# Patient Record
Sex: Female | Born: 1935 | Race: White | Hispanic: No | Smoking: Never smoker
Health system: Southern US, Community
[De-identification: ages and names within clinical notes are randomized; demographics above are authoritative.]

## PROBLEM LIST (undated history)

## (undated) DIAGNOSIS — I219 Acute myocardial infarction, unspecified: Secondary | ICD-10-CM

## (undated) DIAGNOSIS — I10 Essential (primary) hypertension: Secondary | ICD-10-CM

## (undated) DIAGNOSIS — I350 Nonrheumatic aortic (valve) stenosis: Secondary | ICD-10-CM

## (undated) DIAGNOSIS — E079 Disorder of thyroid, unspecified: Secondary | ICD-10-CM

## (undated) DIAGNOSIS — Z952 Presence of prosthetic heart valve: Secondary | ICD-10-CM

## (undated) HISTORY — PX: AORTIC VALVE REPLACEMENT: SHX41

---

## 2016-06-16 ENCOUNTER — Other Ambulatory Visit (HOSPITAL_COMMUNITY): Payer: Self-pay | Admitting: General Surgery

## 2016-06-23 ENCOUNTER — Other Ambulatory Visit: Payer: Self-pay | Admitting: General Surgery

## 2016-06-23 DIAGNOSIS — R109 Unspecified abdominal pain: Secondary | ICD-10-CM

## 2016-07-08 ENCOUNTER — Ambulatory Visit
Admission: RE | Admit: 2016-07-08 | Discharge: 2016-07-08 | Disposition: A | Discharge status: Home / Self Care | Payer: Medicare Other | Source: Ambulatory Visit | Attending: General Surgery | Admitting: General Surgery

## 2016-07-08 DIAGNOSIS — R109 Unspecified abdominal pain: Secondary | ICD-10-CM

## 2016-07-08 MED ORDER — GADOBENATE DIMEGLUMINE 529 MG/ML IV SOLN
13.0000 mL | Freq: Once | INTRAVENOUS | Status: AC | PRN
  Administered 2016-07-08: 13 mL via INTRAVENOUS

## 2016-07-09 ENCOUNTER — Encounter: Payer: Self-pay | Admitting: Physician Assistant

## 2016-07-22 ENCOUNTER — Ambulatory Visit: Payer: Medicare Other | Admitting: Physician Assistant

## 2016-07-22 ENCOUNTER — Telehealth: Payer: Self-pay | Admitting: Physician Assistant

## 2016-07-22 NOTE — Telephone Encounter (Signed)
No charge for today. Per Hyacinth MeekerJennifer Lemmon, PA-C.

## 2019-06-12 ENCOUNTER — Inpatient Hospital Stay (HOSPITAL_COMMUNITY)
Admission: EM | Admit: 2019-06-12 | Discharge: 2019-06-16 | DRG: 177 | Disposition: A | Discharge status: Home / Self Care | Payer: Medicare Other | Attending: Internal Medicine | Admitting: Internal Medicine

## 2019-06-12 ENCOUNTER — Other Ambulatory Visit: Payer: Self-pay

## 2019-06-12 ENCOUNTER — Encounter (HOSPITAL_COMMUNITY): Payer: Self-pay | Admitting: Emergency Medicine

## 2019-06-12 ENCOUNTER — Emergency Department (HOSPITAL_COMMUNITY): Payer: Medicare Other

## 2019-06-12 DIAGNOSIS — K5900 Constipation, unspecified: Secondary | ICD-10-CM | POA: Diagnosis present

## 2019-06-12 DIAGNOSIS — U071 COVID-19: Secondary | ICD-10-CM | POA: Diagnosis present

## 2019-06-12 DIAGNOSIS — Z79890 Hormone replacement therapy (postmenopausal): Secondary | ICD-10-CM

## 2019-06-12 DIAGNOSIS — I252 Old myocardial infarction: Secondary | ICD-10-CM

## 2019-06-12 DIAGNOSIS — R112 Nausea with vomiting, unspecified: Secondary | ICD-10-CM | POA: Diagnosis present

## 2019-06-12 DIAGNOSIS — E039 Hypothyroidism, unspecified: Secondary | ICD-10-CM | POA: Diagnosis present

## 2019-06-12 DIAGNOSIS — H9190 Unspecified hearing loss, unspecified ear: Secondary | ICD-10-CM | POA: Diagnosis present

## 2019-06-12 DIAGNOSIS — Z952 Presence of prosthetic heart valve: Secondary | ICD-10-CM | POA: Diagnosis not present

## 2019-06-12 DIAGNOSIS — R001 Bradycardia, unspecified: Secondary | ICD-10-CM | POA: Diagnosis present

## 2019-06-12 DIAGNOSIS — R42 Dizziness and giddiness: Secondary | ICD-10-CM | POA: Diagnosis present

## 2019-06-12 DIAGNOSIS — I2510 Atherosclerotic heart disease of native coronary artery without angina pectoris: Secondary | ICD-10-CM | POA: Diagnosis present

## 2019-06-12 DIAGNOSIS — L89151 Pressure ulcer of sacral region, stage 1: Secondary | ICD-10-CM | POA: Diagnosis present

## 2019-06-12 DIAGNOSIS — J1282 Pneumonia due to coronavirus disease 2019: Secondary | ICD-10-CM | POA: Diagnosis present

## 2019-06-12 DIAGNOSIS — J1289 Other viral pneumonia: Secondary | ICD-10-CM | POA: Diagnosis present

## 2019-06-12 DIAGNOSIS — I10 Essential (primary) hypertension: Secondary | ICD-10-CM | POA: Diagnosis present

## 2019-06-12 DIAGNOSIS — R946 Abnormal results of thyroid function studies: Secondary | ICD-10-CM | POA: Diagnosis present

## 2019-06-12 DIAGNOSIS — L8990 Pressure ulcer of unspecified site, unspecified stage: Secondary | ICD-10-CM | POA: Insufficient documentation

## 2019-06-12 HISTORY — DX: Disorder of thyroid, unspecified: E07.9

## 2019-06-12 HISTORY — DX: Acute myocardial infarction, unspecified: I21.9

## 2019-06-12 HISTORY — DX: Essential (primary) hypertension: I10

## 2019-06-12 LAB — URINALYSIS, ROUTINE W REFLEX MICROSCOPIC
Bilirubin Urine: NEGATIVE
Glucose, UA: NEGATIVE mg/dL
Hgb urine dipstick: NEGATIVE
Ketones, ur: NEGATIVE mg/dL
Nitrite: NEGATIVE
Protein, ur: NEGATIVE mg/dL
Specific Gravity, Urine: 1.004 — ABNORMAL LOW (ref 1.005–1.030)
pH: 7 (ref 5.0–8.0)

## 2019-06-12 LAB — CBC
HCT: 36.3 % (ref 36.0–46.0)
HCT: 34.3 % — ABNORMAL LOW (ref 36.0–46.0)
Hemoglobin: 11.2 g/dL — ABNORMAL LOW (ref 12.0–15.0)
Hemoglobin: 11.1 g/dL — ABNORMAL LOW (ref 12.0–15.0)
MCH: 29.9 pg (ref 26.0–34.0)
MCH: 29.8 pg (ref 26.0–34.0)
MCHC: 30.9 g/dL (ref 30.0–36.0)
MCHC: 32.4 g/dL (ref 30.0–36.0)
MCV: 96.8 fL (ref 80.0–100.0)
MCV: 92 fL (ref 80.0–100.0)
Platelets: 262 10*3/uL (ref 150–400)
Platelets: 243 10*3/uL (ref 150–400)
RBC: 3.75 MIL/uL — ABNORMAL LOW (ref 3.87–5.11)
RBC: 3.73 MIL/uL — ABNORMAL LOW (ref 3.87–5.11)
RDW: 15.7 % — ABNORMAL HIGH (ref 11.5–15.5)
RDW: 15.4 % (ref 11.5–15.5)
WBC: 6.8 10*3/uL (ref 4.0–10.5)
WBC: 6.1 10*3/uL (ref 4.0–10.5)
nRBC: 0 % (ref 0.0–0.2)
nRBC: 0 % (ref 0.0–0.2)

## 2019-06-12 LAB — C-REACTIVE PROTEIN: CRP: 1.4 mg/dL — ABNORMAL HIGH (ref <1.0)

## 2019-06-12 LAB — BASIC METABOLIC PANEL
Anion gap: 12 (ref 5–15)
BUN: 12 mg/dL (ref 8–23)
CO2: 25 mmol/L (ref 22–32)
Calcium: 8.7 mg/dL — ABNORMAL LOW (ref 8.9–10.3)
Chloride: 101 mmol/L (ref 98–111)
Creatinine, Ser: 0.97 mg/dL (ref 0.44–1.00)
GFR calc Af Amer: 60 mL/min — ABNORMAL LOW (ref >60)
GFR calc non Af Amer: 51 mL/min — ABNORMAL LOW (ref >60)
Glucose, Bld: 112 mg/dL — ABNORMAL HIGH (ref 70–99)
Potassium: 3.8 mmol/L (ref 3.5–5.1)
Sodium: 138 mmol/L (ref 135–145)

## 2019-06-12 LAB — BRAIN NATRIURETIC PEPTIDE: B Natriuretic Peptide: 164 pg/mL — ABNORMAL HIGH (ref 0.0–100.0)

## 2019-06-12 LAB — TROPONIN I (HIGH SENSITIVITY): Troponin I (High Sensitivity): 24 ng/L — ABNORMAL HIGH (ref <18)

## 2019-06-12 LAB — TSH
TSH: 89.497 u[IU]/mL — ABNORMAL HIGH (ref 0.350–4.500)
TSH: 92.815 u[IU]/mL — ABNORMAL HIGH (ref 0.350–4.500)

## 2019-06-12 LAB — ABO/RH: ABO/RH(D): O POS

## 2019-06-12 LAB — FIBRINOGEN: Fibrinogen: 366 mg/dL (ref 210–475)

## 2019-06-12 LAB — FERRITIN: Ferritin: 80 ng/mL (ref 11–307)

## 2019-06-12 LAB — POC SARS CORONAVIRUS 2 AG -  ED: SARS Coronavirus 2 Ag: POSITIVE — AB

## 2019-06-12 LAB — D-DIMER, QUANTITATIVE: D-Dimer, Quant: 1.66 ug/mL-FEU — ABNORMAL HIGH (ref 0.00–0.50)

## 2019-06-12 LAB — LIPASE, BLOOD: Lipase: 22 U/L (ref 11–51)

## 2019-06-12 LAB — PROCALCITONIN: Procalcitonin: <0.1 ng/mL

## 2019-06-12 MED ORDER — LISINOPRIL 10 MG PO TABS
10.0000 mg | ORAL_TABLET | Freq: Every morning | ORAL | Status: DC
  Administered 2019-06-13 – 2019-06-16 (×4): 10 mg via ORAL
  Filled 2019-06-12 (×4): qty 1

## 2019-06-12 MED ORDER — ACETAMINOPHEN 325 MG PO TABS: 650.0000 mg | ORAL_TABLET | Freq: Four times a day (QID) | ORAL | Status: DC | PRN

## 2019-06-12 MED ORDER — FERROUS SULFATE 325 (65 FE) MG PO TABS
325.0000 mg | ORAL_TABLET | Freq: Two times a day (BID) | ORAL | Status: DC
  Administered 2019-06-12 – 2019-06-16 (×8): 325 mg via ORAL
  Filled 2019-06-12 (×8): qty 1

## 2019-06-12 MED ORDER — SODIUM CHLORIDE 0.9 % IV SOLN
200.0000 mg | Freq: Once | INTRAVENOUS | Status: AC
  Administered 2019-06-12: 200 mg via INTRAVENOUS
  Filled 2019-06-12: qty 40

## 2019-06-12 MED ORDER — ONDANSETRON HCL 4 MG/2ML IJ SOLN
4.0000 mg | Freq: Four times a day (QID) | INTRAMUSCULAR | Status: DC | PRN
  Administered 2019-06-12 – 2019-06-15 (×4): 4 mg via INTRAVENOUS
  Filled 2019-06-12 (×4): qty 2

## 2019-06-12 MED ORDER — VITAMIN C 500 MG PO TABS
500.0000 mg | ORAL_TABLET | Freq: Every day | ORAL | Status: DC
  Administered 2019-06-12 – 2019-06-16 (×5): 500 mg via ORAL
  Filled 2019-06-12 (×4): qty 1

## 2019-06-12 MED ORDER — SODIUM CHLORIDE 0.9 % IV SOLN
100.0000 mg | INTRAVENOUS | Status: DC
  Administered 2019-06-13 – 2019-06-15 (×3): 100 mg via INTRAVENOUS
  Filled 2019-06-12 (×2): qty 20
  Filled 2019-06-12: qty 100

## 2019-06-12 MED ORDER — DEXAMETHASONE SODIUM PHOSPHATE 10 MG/ML IJ SOLN
6.0000 mg | INTRAMUSCULAR | Status: DC
  Administered 2019-06-12 – 2019-06-15 (×4): 6 mg via INTRAVENOUS
  Filled 2019-06-12 (×4): qty 1

## 2019-06-12 MED ORDER — ENOXAPARIN SODIUM 40 MG/0.4ML ~~LOC~~ SOLN
40.0000 mg | SUBCUTANEOUS | Status: DC
  Administered 2019-06-12 – 2019-06-15 (×4): 40 mg via SUBCUTANEOUS
  Filled 2019-06-12 (×3): qty 0.4

## 2019-06-12 MED ORDER — ENSURE ENLIVE PO LIQD
237.0000 mL | Freq: Two times a day (BID) | ORAL | Status: DC
  Administered 2019-06-13 – 2019-06-16 (×6): 237 mL via ORAL

## 2019-06-12 MED ORDER — ZINC SULFATE 220 (50 ZN) MG PO CAPS
220.0000 mg | ORAL_CAPSULE | Freq: Every day | ORAL | Status: DC
  Administered 2019-06-12 – 2019-06-16 (×5): 220 mg via ORAL
  Filled 2019-06-12 (×5): qty 1

## 2019-06-12 MED ORDER — SODIUM CHLORIDE 0.9 % IV BOLUS
500.0000 mL | Freq: Once | INTRAVENOUS | Status: AC
  Administered 2019-06-12: 500 mL via INTRAVENOUS

## 2019-06-12 MED ORDER — ONDANSETRON HCL 4 MG PO TABS
4.0000 mg | ORAL_TABLET | Freq: Four times a day (QID) | ORAL | Status: DC | PRN
  Administered 2019-06-16: 4 mg via ORAL
  Filled 2019-06-12: qty 1

## 2019-06-12 MED ORDER — ASPIRIN EC 81 MG PO TBEC
81.0000 mg | DELAYED_RELEASE_TABLET | Freq: Every morning | ORAL | Status: DC
  Administered 2019-06-13 – 2019-06-16 (×4): 81 mg via ORAL
  Filled 2019-06-12 (×4): qty 1

## 2019-06-12 MED ORDER — PANTOPRAZOLE SODIUM 40 MG PO TBEC
40.0000 mg | DELAYED_RELEASE_TABLET | Freq: Every day | ORAL | Status: DC
  Administered 2019-06-12 – 2019-06-16 (×5): 40 mg via ORAL
  Filled 2019-06-12 (×4): qty 1

## 2019-06-12 MED ORDER — LEVOTHYROXINE SODIUM 88 MCG PO TABS
88.0000 ug | ORAL_TABLET | Freq: Every morning | ORAL | Status: DC
  Administered 2019-06-13: 88 ug via ORAL
  Filled 2019-06-12: qty 1

## 2019-06-12 MED ORDER — SODIUM CHLORIDE 0.9% FLUSH
3.0000 mL | Freq: Once | INTRAVENOUS | Status: AC
  Administered 2019-06-16: 3 mL via INTRAVENOUS

## 2019-06-12 NOTE — ED Triage Notes (Signed)
Pt states she was exposed to Covid. Her granddaughter had covid. Pt states she has been feeling bad for 2-3 days. Feeling dizzy, nauseated. Denies any cough.

## 2019-06-12 NOTE — ED Notes (Signed)
Please call Ryah Cribb granddaughter @ [phone]--GNF a status update--Khushbu Pippen

## 2019-06-12 NOTE — H&P (Addendum)
History and Physical    [name] WER:[id number] DOB: Apr 27, 1927 DOA: 06/12/2019  PCP: Patient, No Pcp Per  Patient coming from: Home I have personally briefly reviewed patient's old medical records in [facility]  Chief Complaint: Nausea, vomiting and weakness  HPI: Morgan Holland is a 85 y.o. female with medical history significant of hypertension, coronary artery disease presents to emergency department due to nausea, vomiting, weakness and dizziness.  Patient tells me that she her granddaughter recently diagnosed with COVID-19.  No history of fever, chills, cough, congestion, shortness of breath, wheezing, headache, blurry vision, chest pain, palpitation, leg swelling, urinary or sleep changes.  Had loose stool this morning however denies loss of sense of taste or smell.   ED Course: Upon arrival: Patient's blood pressure elevated, bradycardic, on room air, COVID-19 positive, CBC, BMP, lipase: WNL.  Chest x-ray consistent with atypical pneumonia.  Review of Systems: As per HPI otherwise negative.    Past Medical History:  Diagnosis Date  . Hypertension   . MI (myocardial infarction) (HCC)   . Thyroid disease     Past Surgical History:  Procedure Laterality Date  . AORTIC VALVE REPLACEMENT       reports that she has never smoked. She has never used smokeless tobacco. She reports previous alcohol use. She reports that she does not use drugs.  No Known Allergies  No family history on file.  Prior to Admission medications   Not on File    Physical Exam: Vitals:   06/12/19 1600 06/12/19 1615 06/12/19 1630 06/12/19 1645  BP: (!) 167/80 (!) 172/77 (!) 148/72 (!) 169/99  Pulse: 89 (!) 44 (!) 42 (!) 47  Resp: 10 11 14 14   Temp:      TempSrc:      SpO2: 95% 100% 99% 100%    Constitutional: NAD, calm, comfortable Eyes: PERRL, lids and conjunctivae normal ENMT: Mucous membranes are moist. Posterior pharynx clear of any exudate or lesions.Normal dentition.   Neck: normal, supple, no masses, no thyromegaly Respiratory: clear to auscultation bilaterally, no wheezing, no crackles. Normal respiratory effort. No accessory muscle use.  Cardiovascular: Bradycardic, no murmurs / rubs / gallops. No extremity edema. 2+ pedal pulses. No carotid bruits.  Abdomen: no tenderness, no masses palpated. No hepatosplenomegaly. Bowel sounds positive.  Musculoskeletal: no clubbing / cyanosis. No joint deformity upper and lower extremities. Good ROM, no contractures. Normal muscle tone.  Skin: no rashes, lesions, ulcers. No induration Neurologic: CN 2-12 grossly intact. Sensation intact, DTR normal. Strength 5/5 in all 4.  Psychiatric: Normal judgment and insight. Alert and oriented x 3. Normal mood.    Labs on Admission: I have personally reviewed following labs and imaging studies  CBC: Recent Labs  Lab 06/12/19 1352  WBC 6.8  HGB 11.2*  HCT 36.3  MCV 96.8  PLT 262   Basic Metabolic Panel: Recent Labs  Lab 06/12/19 1352  NA 138  K 3.8  CL 101  CO2 25  GLUCOSE 112*  BUN 12  CREATININE 0.97  CALCIUM 8.7*   GFR: CrCl cannot be calculated (Unknown ideal weight.). Liver Function Tests: No results for input(s): AST, ALT, ALKPHOS, BILITOT, PROT, ALBUMIN in the last 168 hours. Recent Labs  Lab 06/12/19 1600  LIPASE 22   No results for input(s): AMMONIA in the last 168 hours. Coagulation Profile: No results for input(s): INR, PROTIME in the last 168 hours. Cardiac Enzymes: No results for input(s): CKTOTAL, CKMB, CKMBINDEX, TROPONINI in the last 168 hours. BNP (last 3  results) No results for input(s): PROBNP in the last 8760 hours. HbA1C: No results for input(s): HGBA1C in the last 72 hours. CBG: No results for input(s): GLUCAP in the last 168 hours. Lipid Profile: No results for input(s): CHOL, HDL, LDLCALC, TRIG, CHOLHDL, LDLDIRECT in the last 72 hours. Thyroid Function Tests: Recent Labs    06/12/19 1633  TSH 89.497*   Anemia Panel:  No results for input(s): VITAMINB12, FOLATE, FERRITIN, TIBC, IRON, RETICCTPCT in the last 72 hours. Urine analysis: No results found for: COLORURINE, APPEARANCEUR, LABSPEC, PHURINE, GLUCOSEU, HGBUR, BILIRUBINUR, KETONESUR, PROTEINUR, UROBILINOGEN, NITRITE, LEUKOCYTESUR  Radiological Exams on Admission: Dg Chest Portable 1 View  Result Date: 06/12/2019 CLINICAL DATA:  85 year old female with vomiting. EXAM: PORTABLE CHEST 1 VIEW COMPARISON:  None. FINDINGS: Shallow inspiration with minimal bibasilar atelectasis. Mild eventration of the left hemidiaphragm. There is mild cardiomegaly and mild vascular congestion. No focal consolidation or pneumothorax. Mild diffuse interstitial nodularity noted. Developing atypical infection is not excluded. Clinical correlation is recommended. Apparent blunting of the left costophrenic angle, likely related to atelectasis. Trace left pleural effusion is not excluded. Clinical correlation is recommended. There is atherosclerotic calcification of the aorta. Median sternotomy wires and mechanical heart valve. No acute osseous pathology. IMPRESSION: 1. Mild cardiomegaly with mild vascular congestion. 2. Mild diffuse interstitial nodularity. Clinical correlation is recommended to evaluate for an atypical infection. No focal consolidation or pneumothorax. Electronically Signed   By: Anner Crete M.D.   On: 06/12/2019 16:13    EKG: Junctional versus sinus bradycardia, no ST elevation or depression noted.  Assessment/Plan Principal Problem:   Pneumonia due to COVID-19 virus Active Problems:   Hypertension   Bradycardia   Hypothyroidism    Pneumonia due to COVID-19 virus: -Patient exposed to COVID-19 virus.  Presented with nausea vomiting and generalized weakness.  Chest x-ray as above.  Patient is currently on room air, Afebrile with no leukocytosis. -Ordered inflammatory markers.  On continuous pulse ox. -Monitor vitals closely.  Repeat inflammatory markers  tomorrow AM.  Blood culture and procalcitonin level: Pending. -Started on IV Decadron and p.o. zinc and vitamin C -Patient has positive chest x-ray findings however she is on room air, LFTs is pending: Discussed with pharmacy-patient will be started on remdesivir if LFTs comes back within normal limits. -Patient was told that if COVID-19 pneumonitis gets worse we might potentially use Actemra off label, she denies any known history of tuberculosis or hepatitis, understands the risk and benefits and wants to proceed with Actemra treatment if required. -Zofran as needed for nausea and vomiting.  Bradycardia: -Likely secondary from metoprolol -On telemetry, will get TSH, troponin and transthoracic echo and monitor her heart rate closely -Hold metoprolol  Hypertension: Elevated upon arrival -Continue lisinopril, hold metoprolol due to bradycardia. -Monitor blood pressure closely.  Hypothyroidism: Check TSH -Continue levothyroxine  DVT prophylaxis: TED/SCD/Lovenox Code Status: Full code-confirmed with patient Family Communication: None present at bedside.  Plan of care discussed with patient in length and he verbalized understanding and agreed with it. -I called patient's grand daughter-Angie & discussed & updated her about the patient. Disposition Plan: To be determined  consults called: None  admission status: Inpatient  Mckinley Jewel MD [facility] Pager [phone]  If 7PM-7AM, please contact night-coverage [email or web address] Password [facility]  06/12/2019, 7:49 PM

## 2019-06-12 NOTE — ED Provider Notes (Signed)
[facility] EMERGENCY DEPARTMENT Provider Note   CSN: [id number] Arrival date & time: 06/12/19  1316     History   Chief Complaint Chief Complaint  Patient presents with  . Dizziness    HPI Morgan Holland is a 85 y.o. female.     HPI Patient presents with 2 to 3 days of lightheadedness nausea with dry heaves and feeling bad.  Had a sick contact around 2 weeks ago from her granddaughter who was Covid positive.  Patient has had negative Covid test however.  States she feels lightheaded and dizzy.  Has had previous aortic valve replacement.  No cough.  No fevers. Past Medical History:  Diagnosis Date  . Hypertension   . MI (myocardial infarction) ([place])   . Thyroid disease     There are no active problems to display for this patient.   Past Surgical History:  Procedure Laterality Date  . AORTIC VALVE REPLACEMENT       OB History   No obstetric history on file.      Home Medications    Prior to Admission medications   Not on File    Family History No family history on file.  Social History Social History   Tobacco Use  . Smoking status: Never Smoker  . Smokeless tobacco: Never Used  Substance Use Topics  . Alcohol use: Not Currently  . Drug use: Never     Allergies   Patient has no known allergies.   Review of Systems Review of Systems  Constitutional: Negative for appetite change.  HENT: Negative for congestion.   Respiratory: Negative for shortness of breath.   Cardiovascular: Negative for chest pain.  Gastrointestinal: Positive for nausea and vomiting. Negative for abdominal distention and abdominal pain.  Genitourinary: Negative for dysuria.  Musculoskeletal: Negative for back pain.  Skin: Negative for rash.  Neurological: Positive for light-headedness.  Psychiatric/Behavioral: Negative for confusion.     Physical Exam Updated Vital Signs BP (!) 151/72 (BP Location: Left Arm)   Pulse (!) 43   Temp 97.9 F (36.6  C) (Oral)   Resp 14   SpO2 98%   Physical Exam Vitals signs and nursing note reviewed.  Eyes:     Extraocular Movements: Extraocular movements intact.  Neck:     Musculoskeletal: Neck supple.  Cardiovascular:     Rate and Rhythm: Regular rhythm. Bradycardia present.  Abdominal:     Tenderness: There is no abdominal tenderness.  Musculoskeletal:        General: No tenderness.  Skin:    Capillary Refill: Capillary refill takes less than 2 seconds.  Neurological:     Mental Status: She is alert and oriented to person, place, and time. Mental status is at baseline.      ED Treatments / Results  Labs (all labs ordered are listed, but only abnormal results are displayed) Labs Reviewed  BASIC METABOLIC PANEL - Abnormal; Notable for the following components:      Result Value   Glucose, Bld 112 (*)    Calcium 8.7 (*)    GFR calc non Af Amer 51 (*)    GFR calc Af Amer 60 (*)    All other components within normal limits  CBC - Abnormal; Notable for the following components:   RBC 3.75 (*)    Hemoglobin 11.2 (*)    RDW 15.7 (*)    All other components within normal limits  URINALYSIS, ROUTINE W REFLEX MICROSCOPIC  TSH  LIPASE, BLOOD  CBG  MONITORING, ED  POC SARS CORONAVIRUS 2 AG -  ED    EKG EKG Interpretation  Date/Time:  Monday June 12 2019 13:43:47 EST Ventricular Rate:  42 PR Interval:    QRS Duration: 90 QT Interval:  488 QTC Calculation: 407 R Axis:   78 Text Interpretation: Junctional bradycardia  vs sinus brady Nonspecific ST abnormality Abnormal ECG No old tracing to compare Confirmed by Benjiman Core [phone]) on 06/12/2019 3:23:52 PM   Radiology No results found.  Procedures Procedures (including critical care time)  Medications Ordered in ED Medications  sodium chloride flush (NS) 0.9 % injection 3 mL (has no administration in time range)  sodium chloride 0.9 % bolus 500 mL (has no administration in time range)     Initial Impression /  Assessment and Plan / ED Course  I have reviewed the triage vital signs and the nursing notes.  Pertinent labs & imaging results that were available during my care of the patient were reviewed by me and considered in my medical decision making (see chart for details).       Patient with nausea vomiting and decreased oral intake.  Felt bad for last 2 to 3 days.  No diarrhea.  Has had Covid exposure and will check a Covid test. However patient also is bradycardic.  Pulse of 43.  Reviewing records appears to be often in the 50s but not see that she is been in the 40s.  Potentially could be the cause of the dizziness but usually not the nausea and vomiting.  Lab workPending.  Care will be turned over to Dr. Dalene Seltzer.  Final Clinical Impressions(s) / ED Diagnoses   Final diagnoses:  Bradycardia  Nausea and vomiting, intractability of vomiting not specified, unspecified vomiting type    ED Discharge Orders    None       Benjiman Core, MD 06/12/19 1556

## 2019-06-13 DIAGNOSIS — R001 Bradycardia, unspecified: Secondary | ICD-10-CM

## 2019-06-13 DIAGNOSIS — R112 Nausea with vomiting, unspecified: Secondary | ICD-10-CM

## 2019-06-13 DIAGNOSIS — L8990 Pressure ulcer of unspecified site, unspecified stage: Secondary | ICD-10-CM | POA: Insufficient documentation

## 2019-06-13 DIAGNOSIS — E039 Hypothyroidism, unspecified: Secondary | ICD-10-CM

## 2019-06-13 DIAGNOSIS — I10 Essential (primary) hypertension: Secondary | ICD-10-CM

## 2019-06-13 LAB — PHOSPHORUS: Phosphorus: 2.6 mg/dL (ref 2.5–4.6)

## 2019-06-13 LAB — COMPREHENSIVE METABOLIC PANEL
ALT: 9 U/L (ref 0–44)
ALT: 6 U/L (ref 0–44)
AST: 26 U/L (ref 15–41)
AST: 20 U/L (ref 15–41)
Albumin: 3 g/dL — ABNORMAL LOW (ref 3.5–5.0)
Albumin: 2.6 g/dL — ABNORMAL LOW (ref 3.5–5.0)
Alkaline Phosphatase: 72 U/L (ref 38–126)
Alkaline Phosphatase: 64 U/L (ref 38–126)
Anion gap: 10 (ref 5–15)
Anion gap: 10 (ref 5–15)
BUN: 13 mg/dL (ref 8–23)
BUN: 12 mg/dL (ref 8–23)
CO2: 24 mmol/L (ref 22–32)
CO2: 25 mmol/L (ref 22–32)
Calcium: 8.6 mg/dL — ABNORMAL LOW (ref 8.9–10.3)
Calcium: 8.3 mg/dL — ABNORMAL LOW (ref 8.9–10.3)
Chloride: 103 mmol/L (ref 98–111)
Chloride: 106 mmol/L (ref 98–111)
Creatinine, Ser: 0.87 mg/dL (ref 0.44–1.00)
Creatinine, Ser: 0.93 mg/dL (ref 0.44–1.00)
GFR calc Af Amer: >60 mL/min (ref >60)
GFR calc Af Amer: >60 mL/min (ref >60)
GFR calc non Af Amer: 59 mL/min — ABNORMAL LOW (ref >60)
GFR calc non Af Amer: 54 mL/min — ABNORMAL LOW (ref >60)
Glucose, Bld: 102 mg/dL — ABNORMAL HIGH (ref 70–99)
Glucose, Bld: 117 mg/dL — ABNORMAL HIGH (ref 70–99)
Potassium: 3.6 mmol/L (ref 3.5–5.1)
Potassium: 3.6 mmol/L (ref 3.5–5.1)
Sodium: 137 mmol/L (ref 135–145)
Sodium: 141 mmol/L (ref 135–145)
Total Bilirubin: 1 mg/dL (ref 0.3–1.2)
Total Bilirubin: 0.5 mg/dL (ref 0.3–1.2)
Total Protein: 7.2 g/dL (ref 6.5–8.1)
Total Protein: 6.2 g/dL — ABNORMAL LOW (ref 6.5–8.1)

## 2019-06-13 LAB — CBC WITH DIFFERENTIAL/PLATELET
Abs Immature Granulocytes: 0.06 10*3/uL (ref 0.00–0.07)
Basophils Absolute: 0 10*3/uL (ref 0.0–0.1)
Basophils Relative: 0 %
Eosinophils Absolute: 0.1 10*3/uL (ref 0.0–0.5)
Eosinophils Relative: 1 %
HCT: 32.4 % — ABNORMAL LOW (ref 36.0–46.0)
Hemoglobin: 10.6 g/dL — ABNORMAL LOW (ref 12.0–15.0)
Immature Granulocytes: 1 %
Lymphocytes Relative: 46 %
Lymphs Abs: 2.3 10*3/uL (ref 0.7–4.0)
MCH: 29.9 pg (ref 26.0–34.0)
MCHC: 32.7 g/dL (ref 30.0–36.0)
MCV: 91.3 fL (ref 80.0–100.0)
Monocytes Absolute: 0.5 10*3/uL (ref 0.1–1.0)
Monocytes Relative: 11 %
Neutro Abs: 2 10*3/uL (ref 1.7–7.7)
Neutrophils Relative %: 41 %
Platelets: 249 10*3/uL (ref 150–400)
RBC: 3.55 MIL/uL — ABNORMAL LOW (ref 3.87–5.11)
RDW: 15.4 % (ref 11.5–15.5)
WBC: 5 10*3/uL (ref 4.0–10.5)
nRBC: 0 % (ref 0.0–0.2)

## 2019-06-13 LAB — MAGNESIUM: Magnesium: 1.5 mg/dL — ABNORMAL LOW (ref 1.7–2.4)

## 2019-06-13 LAB — LACTATE DEHYDROGENASE: LDH: 213 U/L — ABNORMAL HIGH (ref 98–192)

## 2019-06-13 LAB — TROPONIN I (HIGH SENSITIVITY): Troponin I (High Sensitivity): 14 ng/L (ref <18)

## 2019-06-13 LAB — C-REACTIVE PROTEIN: CRP: 2.4 mg/dL — ABNORMAL HIGH (ref <1.0)

## 2019-06-13 LAB — FERRITIN: Ferritin: 63 ng/mL (ref 11–307)

## 2019-06-13 LAB — HEPATITIS B SURFACE ANTIGEN: Hepatitis B Surface Ag: NONREACTIVE

## 2019-06-13 LAB — D-DIMER, QUANTITATIVE: D-Dimer, Quant: 1.33 ug/mL-FEU — ABNORMAL HIGH (ref 0.00–0.50)

## 2019-06-13 MED ORDER — LEVOTHYROXINE SODIUM 125 MCG PO TABS
62.0000 ug | ORAL_TABLET | Freq: Once | ORAL | Status: DC
  Filled 2019-06-13: qty 0.5

## 2019-06-13 MED ORDER — MAGNESIUM SULFATE 2 GM/50ML IV SOLN
2.0000 g | Freq: Once | INTRAVENOUS | Status: AC
  Administered 2019-06-13: 2 g via INTRAVENOUS
  Filled 2019-06-13: qty 50

## 2019-06-13 MED ORDER — LEVOTHYROXINE SODIUM 75 MCG PO TABS: 150.0000 ug | ORAL_TABLET | Freq: Every day | ORAL | Status: DC

## 2019-06-13 MED ORDER — HYDRALAZINE HCL 25 MG PO TABS: 25.0000 mg | ORAL_TABLET | Freq: Four times a day (QID) | ORAL | Status: DC | PRN

## 2019-06-13 MED ORDER — POLYETHYLENE GLYCOL 3350 17 G PO PACK
17.0000 g | PACK | Freq: Every day | ORAL | Status: DC
  Administered 2019-06-13 – 2019-06-16 (×4): 17 g via ORAL
  Filled 2019-06-13 (×4): qty 1

## 2019-06-13 MED ORDER — ADULT MULTIVITAMIN W/MINERALS CH
1.0000 | ORAL_TABLET | Freq: Every day | ORAL | Status: DC
  Administered 2019-06-13 – 2019-06-16 (×4): 1 via ORAL
  Filled 2019-06-13 (×4): qty 1

## 2019-06-13 MED ORDER — LEVOTHYROXINE SODIUM 112 MCG PO TABS
112.0000 ug | ORAL_TABLET | Freq: Every day | ORAL | Status: DC
  Administered 2019-06-14 – 2019-06-16 (×3): 112 ug via ORAL
  Filled 2019-06-13 (×3): qty 1

## 2019-06-13 NOTE — Progress Notes (Addendum)
[facility]  PROGRESS NOTE  Morgan Holland KAJ:[id number] DOB: [birth year] DOA: 06/12/2019 PCP: Patient, No Pcp Per   Brief HPI:   85 year old female with medical history of hypertension, CAD presented to ED with nausea vomiting and dizziness.  Patient's granddaughter was recently diagnosed with COVID-19.  Most of fever, cough congestion shortness of breath on arrival in ED patient was found to be bradycardic, COVID-19 was positive.  Chest x-ray consistent with atypical pneumonia.   Subjective   Patient seen and examined, denies shortness of breath.  Still complains of nausea.  Denies vomiting.  TSH significantly elevated up to 92.815   Assessment/Plan:     1. Pneumonia due to COVID-19 virus-patient is not requiring oxygen, she was started on Decadron, zinc and vitamin C supplementation.  CRP is elevated to 2.4.  Will start remdesivir per pharmacy consultation.  Follow inflammatory markers in a.m.  2. Hypothyroidism-patient has history of hypothyroidism, she takes Synthroid 88 mcg daily at home.  TSH was significantly elevated 89.5 yesterday, repeat TSH was  at 92.8.  I called and discussed with patient's granddaughter that she was taking Synthroid regularly at home.  As per pharmacy patient last filled 90-day supply in July 2020, questionable compliance.  Will increase the dose to 112 mcg p.o. daily from tomorrow morning.  She will have to follow-up with PCP and get TSH checked in 6 to 8 weeks.  3. Bradycardia-patient's heart rate was in 40s at the time of admission, patient takes metoprolol 100 mg p.o. twice daily at home.  Metoprolol is currently on hold.  Consider restarting metoprolol at lower dose once patient heart rate improves.  Also found to have significantly elevated TSH as above.  Dose of Synthroid increased from 88 mcg to 150 mcg p.o. daily.  We will continue to monitor patient on telemetry.  4. Hypertension-continue lisinopril, add hydralazine as  needed.     CBC: Recent Labs  Lab 06/12/19 1352 06/12/19 1955 06/13/19 0348  WBC 6.8 6.1 5.0  NEUTROABS  --   --  2.0  HGB 11.2* 11.1* 10.6*  HCT 36.3 34.3* 32.4*  MCV 96.8 92.0 91.3  PLT 262 243 035    Basic Metabolic Panel: Recent Labs  Lab 06/12/19 1352 06/12/19 1955 06/13/19 0348  NA 138 137 141  K 3.8 3.6 3.6  CL 101 103 106  CO2 25 24 25   GLUCOSE 112* 102* 117*  BUN 12 13 12   CREATININE 0.97 0.87 0.93  CALCIUM 8.7* 8.6* 8.3*  MG  --   --  1.5*  PHOS  --   --  2.6       DVT prophylaxis: Lovenox  Code Status: Full code  Family Communication: No family at bedside  Disposition Plan: likely home when medically ready for discharge Pressure Injury 06/12/19 Coccyx Stage I -  Intact skin with non-blanchable redness of a localized area usually over a bony prominence. reddened, non-blanchable area - coccyx (Active)  06/12/19 2100  Location: Coccyx  Location Orientation:   Staging: Stage I -  Intact skin with non-blanchable redness of a localized area usually over a bony prominence.  Wound Description (Comments): reddened, non-blanchable area - coccyx  Present on Admission: Yes        BMI  Estimated body mass index is 22.93 kg/m as calculated from the following:   Height as of this encounter: 5\' 5"  (1.651 m).   Weight as of this encounter: 62.5 kg.  Scheduled medications:  . aspirin EC  81 mg Oral q  morning - 10a  . dexamethasone (DECADRON) injection  6 mg Intravenous Q24H  . enoxaparin (LOVENOX) injection  40 mg Subcutaneous Q24H  . feeding supplement (ENSURE ENLIVE)  237 mL Oral BID BM  . ferrous sulfate  325 mg Oral BID  . levothyroxine  88 mcg Oral q morning - 10a  . lisinopril  10 mg Oral q morning - 10a  . pantoprazole  40 mg Oral Daily  . polyethylene glycol  17 g Oral Daily  . sodium chloride flush  3 mL Intravenous Once  . vitamin C  500 mg Oral Daily  . zinc sulfate  220 mg Oral Daily     Consultants:    Procedures:     Antibiotics:   Anti-infectives (From admission, onward)   Start     Dose/Rate Route Frequency Ordered Stop   06/13/19 2130  remdesivir 100 mg in sodium chloride 0.9 % 250 mL IVPB     100 mg 500 mL/hr over 30 Minutes Intravenous Every 24 hours 06/12/19 2057 06/17/19 2129   06/12/19 2130  remdesivir 200 mg in sodium chloride 0.9 % 250 mL IVPB     200 mg 500 mL/hr over 30 Minutes Intravenous Once 06/12/19 2057 06/12/19 2236       Objective   Vitals:   06/12/19 2056 06/12/19 2110 06/12/19 2320 06/13/19 0752  BP:  (!) 149/99 133/89 (!) 150/80  Pulse:  62 (!) 57 62  Resp:  13 14 14   Temp:  98.7 F (37.1 C) 98.1 F (36.7 C) 98 F (36.7 C)  TempSrc:  Oral Oral Oral  SpO2:  99% 95% 100%  Weight: 62.5 kg     Height: 5\' 5"  (1.651 m)       Intake/Output Summary (Last 24 hours) at 06/13/2019 1152 Last data filed at 06/13/2019 0900 Gross per 24 hour  Intake 120 ml  Output 425 ml  Net -305 ml   Filed Weights   06/12/19 2056  Weight: 62.5 kg     Physical Examination:   General-appears in no acute distress Heart-S1-S2, regular, no murmur auscultated Lungs-clear to auscultation bilaterally, no wheezing or crackles auscultated Abdomen-soft, nontender, no organomegaly Extremities-no edema in the lower extremities Neuro-alert, oriented x3, no focal deficit noted    Data Reviewed: I have personally reviewed following labs and imaging studies   Recent Results (from the past 240 hour(s))  Culture, blood (Routine X 2) w Reflex to ID Panel     Status: None (Preliminary result)   Collection Time: 06/12/19  7:44 PM   Specimen: BLOOD LEFT ARM  Result Value Ref Range Status   Specimen Description BLOOD LEFT ARM  Final   Special Requests   Final    BOTTLES DRAWN AEROBIC AND ANAEROBIC Blood Culture results may not be optimal due to an inadequate volume of blood received in culture bottles   Culture   Final    NO GROWTH < 12  HOURS Performed at [facility], [address] [place], [place] [address]    Report Status PENDING  Incomplete  Culture, blood (Routine X 2) w Reflex to ID Panel     Status: None (Preliminary result)   Collection Time: 06/12/19  8:05 PM   Specimen: BLOOD LEFT HAND  Result Value Ref Range Status   Specimen Description BLOOD LEFT HAND  Final   Special Requests   Final    BOTTLES DRAWN AEROBIC AND ANAEROBIC Blood Culture results may not be optimal due to an inadequate volume of  blood received in culture bottles   Culture   Final    NO GROWTH < 12 HOURS Performed at [facility], [address], [place], [place] [address]    Report Status PENDING  Incomplete     Liver Function Tests: Recent Labs  Lab 06/12/19 1955 06/13/19 0348  AST 26 20  ALT 9 6  ALKPHOS 72 64  BILITOT 1.0 0.5  PROT 7.2 6.2*  ALBUMIN 3.0* 2.6*   Recent Labs  Lab 06/12/19 1600  LIPASE 22   No results for input(s): AMMONIA in the last 168 hours.  Cardiac Enzymes: No results for input(s): CKTOTAL, CKMB, CKMBINDEX, TROPONINI in the last 168 hours. BNP (last 3 results) Recent Labs    06/12/19 1950  BNP 164.0*     Studies: Dg Chest Portable 1 View  Result Date: 06/12/2019 CLINICAL DATA:  85 year old female with vomiting. EXAM: PORTABLE CHEST 1 VIEW COMPARISON:  None. FINDINGS: Shallow inspiration with minimal bibasilar atelectasis. Mild eventration of the left hemidiaphragm. There is mild cardiomegaly and mild vascular congestion. No focal consolidation or pneumothorax. Mild diffuse interstitial nodularity noted. Developing atypical infection is not excluded. Clinical correlation is recommended. Apparent blunting of the left costophrenic angle, likely related to atelectasis. Trace left pleural effusion is not excluded. Clinical correlation is recommended. There is atherosclerotic calcification of the aorta. Median sternotomy wires and mechanical heart valve. No acute osseous pathology.  IMPRESSION: 1. Mild cardiomegaly with mild vascular congestion. 2. Mild diffuse interstitial nodularity. Clinical correlation is recommended to evaluate for an atypical infection. No focal consolidation or pneumothorax. Electronically Signed   By: Elgie Collard M.D.   On: 06/12/2019 16:13     Admission status: Inpatient: Based on patients clinical presentation and evaluation of above clinical data, I have made determination that patient meets Inpatient criteria at this time.   Meredeth Ide   [facility] Pager [phone]. If 7PM-7AM, please contact night-coverage at [email or web address], Office  [phone]  password [email or web address]  06/13/2019, 11:52 AM  LOS: 1 day

## 2019-06-13 NOTE — Progress Notes (Signed)
Daily Progress Note  Patient calm, and pleasant. Patient ambulated to chair from bed and back to bed. Patient IV flushed and saline locked after magnesium admin. Patient was concerned of not having a bowel movement. Miralax 17g pouch was ordered

## 2019-06-13 NOTE — Plan of Care (Signed)

## 2019-06-13 NOTE — Progress Notes (Signed)
Initial Nutrition Assessment  INTERVENTION:   -Ensure Enlive po BID, each supplement provides 350 kcal and 20 grams of protein -Multivitamin with minerals daily  NUTRITION DIAGNOSIS:   Increased nutrient needs related to acute illness as evidenced by estimated needs.  GOAL:   Patient will meet greater than or equal to 90% of their needs  MONITOR:   PO intake, Supplement acceptance, Labs, Weight trends, I & O's  REASON FOR ASSESSMENT:   Malnutrition Screening Tool    ASSESSMENT:   85 year old female with medical history of hypertension, CAD presented to ED with nausea vomiting and dizziness.  Patient's granddaughter was recently diagnosed with COVID-19.  Most of fever, cough congestion shortness of breath on arrival in ED patient was found to be bradycardic, COVID-19 was positive.  Chest x-ray consistent with atypical pneumonia. 11/30: COVID+  **RD working remotely**  Patient reports feeling sick for a few days PTA, with nausea and dizziness. Pt has not been eating that well as a result. Patient has been ordered Ensure supplements given increased needs from active COVID-19 infection. RD to add daily MVI as well.  Per weight records in care everywhere, pt's weight has remained stable at 137 lbs.   I/Os: -255 ml since admit UOP: 425 ml x 24 hrs  Medications: Ferrous sulfate tablet, vitamin C tablet, Zinc sulfate capsule, IV Zofran PRN Labs reviewed: Low Mg   NUTRITION - FOCUSED PHYSICAL EXAM:  Working remotely.  Diet Order:   Diet Order            Diet Heart Room service appropriate? Yes; Fluid consistency: Thin  Diet effective now              EDUCATION NEEDS:   No education needs have been identified at this time  Skin:  Skin Assessment: Skin Integrity Issues: Skin Integrity Issues:: Stage I Stage I: coccyx  Last BM:  11/29  Height:   Ht Readings from Last 1 Encounters:  06/12/19 5\' 5"  (1.651 m)    Weight:   Wt Readings from Last 1 Encounters:   06/12/19 62.5 kg    Ideal Body Weight:  56.8 kg  BMI:  Body mass index is 22.93 kg/m.  Estimated Nutritional Needs:   Kcal:  1500-1700  Protein:  65-75g  Fluid:  1.5L/day  Clayton Bibles, MS, RD, LDN Inpatient Clinical Dietitian Pager: [phone] After Hours Pager: [phone]

## 2019-06-14 LAB — COMPREHENSIVE METABOLIC PANEL
ALT: 8 U/L (ref 0–44)
AST: 20 U/L (ref 15–41)
Albumin: 2.7 g/dL — ABNORMAL LOW (ref 3.5–5.0)
Alkaline Phosphatase: 64 U/L (ref 38–126)
Anion gap: 12 (ref 5–15)
BUN: 18 mg/dL (ref 8–23)
CO2: 24 mmol/L (ref 22–32)
Calcium: 8.4 mg/dL — ABNORMAL LOW (ref 8.9–10.3)
Chloride: 101 mmol/L (ref 98–111)
Creatinine, Ser: 0.89 mg/dL (ref 0.44–1.00)
GFR calc Af Amer: >60 mL/min (ref >60)
GFR calc non Af Amer: 57 mL/min — ABNORMAL LOW (ref >60)
Glucose, Bld: 129 mg/dL — ABNORMAL HIGH (ref 70–99)
Potassium: 4.1 mmol/L (ref 3.5–5.1)
Sodium: 137 mmol/L (ref 135–145)
Total Bilirubin: 0.3 mg/dL (ref 0.3–1.2)
Total Protein: 6.3 g/dL — ABNORMAL LOW (ref 6.5–8.1)

## 2019-06-14 LAB — FERRITIN: Ferritin: 69 ng/mL (ref 11–307)

## 2019-06-14 LAB — CBC WITH DIFFERENTIAL/PLATELET
Abs Immature Granulocytes: 0.09 10*3/uL — ABNORMAL HIGH (ref 0.00–0.07)
Basophils Absolute: 0 10*3/uL (ref 0.0–0.1)
Basophils Relative: 0 %
Eosinophils Absolute: 0 10*3/uL (ref 0.0–0.5)
Eosinophils Relative: 0 %
HCT: 33 % — ABNORMAL LOW (ref 36.0–46.0)
Hemoglobin: 10.8 g/dL — ABNORMAL LOW (ref 12.0–15.0)
Immature Granulocytes: 2 %
Lymphocytes Relative: 32 %
Lymphs Abs: 1.5 10*3/uL (ref 0.7–4.0)
MCH: 29.8 pg (ref 26.0–34.0)
MCHC: 32.7 g/dL (ref 30.0–36.0)
MCV: 91.2 fL (ref 80.0–100.0)
Monocytes Absolute: 0.2 10*3/uL (ref 0.1–1.0)
Monocytes Relative: 4 %
Neutro Abs: 2.9 10*3/uL (ref 1.7–7.7)
Neutrophils Relative %: 62 %
Platelets: 265 10*3/uL (ref 150–400)
RBC: 3.62 MIL/uL — ABNORMAL LOW (ref 3.87–5.11)
RDW: 15.4 % (ref 11.5–15.5)
WBC: 4.6 10*3/uL (ref 4.0–10.5)
nRBC: 0 % (ref 0.0–0.2)

## 2019-06-14 LAB — D-DIMER, QUANTITATIVE: D-Dimer, Quant: 2.9 ug/mL-FEU — ABNORMAL HIGH (ref 0.00–0.50)

## 2019-06-14 LAB — MAGNESIUM: Magnesium: 2.1 mg/dL (ref 1.7–2.4)

## 2019-06-14 LAB — C-REACTIVE PROTEIN: CRP: 1.7 mg/dL — ABNORMAL HIGH (ref <1.0)

## 2019-06-14 LAB — PHOSPHORUS: Phosphorus: 3.4 mg/dL (ref 2.5–4.6)

## 2019-06-14 NOTE — Plan of Care (Signed)
  Problem: Education: Goal: Knowledge of risk factors and measures for prevention of condition will improve Outcome: Progressing   Problem: Coping: Goal: Psychosocial and spiritual needs will be supported Outcome: Progressing   Problem: Respiratory: Goal: Will maintain a patent airway Outcome: Progressing Goal: Complications related to the disease process, condition or treatment will be avoided or minimized Outcome: Progressing   Problem: Education: Goal: Knowledge of General Education information will improve Description: Including pain rating scale, medication(s)/side effects and non-pharmacologic comfort measures Outcome: Progressing   Problem: Clinical Measurements: Goal: Ability to maintain clinical measurements within normal limits will improve Outcome: Progressing Goal: Will remain free from infection Outcome: Progressing Goal: Diagnostic test results will improve Outcome: Progressing Goal: Respiratory complications will improve Outcome: Progressing Goal: Cardiovascular complication will be avoided Outcome: Progressing   Problem: Activity: Goal: Risk for activity intolerance will decrease Outcome: Progressing   Problem: Nutrition: Goal: Adequate nutrition will be maintained Outcome: Progressing   Problem: Coping: Goal: Level of anxiety will decrease Outcome: Progressing   Problem: Elimination: Goal: Will not experience complications related to bowel motility Outcome: Progressing Goal: Will not experience complications related to urinary retention Outcome: Progressing   Problem: Pain Managment: Goal: General experience of comfort will improve Outcome: Progressing   Problem: Safety: Goal: Ability to remain free from injury will improve Outcome: Progressing   Problem: Skin Integrity: Goal: Risk for impaired skin integrity will decrease Outcome: Progressing   

## 2019-06-14 NOTE — Progress Notes (Signed)
PROGRESS NOTE    Morgan Holland  UXN:[id number] DOB: 12-29-1926 DOA: 06/12/2019 PCP: Patient, No Pcp Per    Brief Narrative:  85 year old female with history of hypertension, coronary artery disease presented to the emergency room with nausea, vomiting and dizziness.  Patient lives at home with her granddaughter who was recently diagnosed with COVID-19.  She complains of fever and cough and congestion.  In the ER she was bradycardic, COVID-19 positive.  Chest x-ray consistent with bilateral atypical pneumonia.   Assessment & Plan:   Principal Problem:   Pneumonia due to COVID-19 virus Active Problems:   Hypertension   Bradycardia   Hypothyroidism   Pressure injury of skin  Pneumonia due to COVID-19 virus: Continue to monitor due to significant symptoms . chest physiotherapy, incentive spirometry, deep breathing exercises, sputum induction, mucolytic's and bronchodilators. Supplemental oxygen to keep saturations more than 90%.  Mostly on room air. Covid directed therapy with , steroids dexamethasone Remdesivi day 3/5 antibiotics not indicated. Due to severity of symptoms, patient will need daily inflammatory markers, chest x-rays, liver function test to monitor and direct COVID-19 therapies.  Hypothyroidism: History of hypothyroidism.  TSH 89.5.  Probably not taking thyroxine.  Dose increased.  Will need outpatient follow-up and repeat TSH in 1 month.  Bradycardia: Heart rate reported 40s at the time of admission.  Metoprolol discontinued.  Heart rate improved.  Hypertension: Blood pressure stable on lisinopril and hydralazine.   DVT prophylaxis: Lovenox subcu Code Status: Full code Family Communication: Granddaughter, Ms. Al Corpus called and updated Disposition Plan: Home.  Anticipate next 24 to 48 hours.  Start working with PT OT.   Consultants:   None  Procedures:   None  Antimicrobials:  Antibiotics Given (last 72 hours)    Date/Time Action Medication Dose Rate   06/12/19 2206 New Bag/Given   remdesivir 200 mg in sodium chloride 0.9 % 250 mL IVPB 200 mg 500 mL/hr   06/13/19 2038 New Bag/Given   remdesivir 100 mg in sodium chloride 0.9 % 250 mL IVPB 100 mg 500 mL/hr         Subjective: Patient seen and examined.  Hard of hearing.  No overnight events.  Afebrile.  Mostly on room air.  She has not ambulated, however she feels slightly better than yesterday.  No dizziness or nausea at rest.  Objective: Vitals:   06/13/19 0752 06/13/19 1637 06/13/19 2323 06/14/19 0815  BP: (!) 150/80 133/73 101/69 (!) 150/95  Pulse: 62 80 71 78  Resp: 14 19 16 14   Temp: 98 F (36.7 C) (!) 97.5 F (36.4 C) (!) 96.5 F (35.8 C) 98.3 F (36.8 C)  TempSrc: Oral Oral Axillary   SpO2: 100% 95% 96% 99%  Weight:      Height:        Intake/Output Summary (Last 24 hours) at 06/14/2019 1531 Last data filed at 06/14/2019 2542 Gross per 24 hour  Intake 537 ml  Output 750 ml  Net -213 ml   Filed Weights   06/12/19 2056  Weight: 62.5 kg    Examination:  General exam: Appears calm and comfortable, on room air Respiratory system: Clear to auscultation. Respiratory effort normal. Cardiovascular system: S1 & S2 heard, RRR. No JVD, murmurs, rubs, gallops or clicks. No pedal edema. Gastrointestinal system: Abdomen is nondistended, soft and nontender. No organomegaly or masses felt. Normal bowel sounds heard. Central nervous system: Alert and oriented. No focal neurological deficits. Extremities: Symmetric 5 x 5 power. Skin: No rashes, lesions or ulcers Psychiatry: Judgement  and insight appear normal. Mood & affect appropriate.     Data Reviewed: I have personally reviewed following labs and imaging studies  CBC: Recent Labs  Lab 06/12/19 1352 06/12/19 1955 06/13/19 0348 06/14/19 0451  WBC 6.8 6.1 5.0 4.6  NEUTROABS  --   --  2.0 2.9  HGB 11.2* 11.1* 10.6* 10.8*  HCT 36.3 34.3* 32.4* 33.0*  MCV 96.8 92.0 91.3 91.2  PLT 262 243 249 265   Basic  Metabolic Panel: Recent Labs  Lab 06/12/19 1352 06/12/19 1955 06/13/19 0348 06/14/19 0451  NA 138 137 141 137  K 3.8 3.6 3.6 4.1  CL 101 103 106 101  CO2 25 24 25 24   GLUCOSE 112* 102* 117* 129*  BUN 12 13 12 18   CREATININE 0.97 0.87 0.93 0.89  CALCIUM 8.7* 8.6* 8.3* 8.4*  MG  --   --  1.5* 2.1  PHOS  --   --  2.6 3.4   GFR: Estimated Creatinine Clearance: 37.8 mL/min (by C-G formula based on SCr of 0.89 mg/dL). Liver Function Tests: Recent Labs  Lab 06/12/19 1955 06/13/19 0348 06/14/19 0451  AST 26 20 20   ALT 9 6 8   ALKPHOS 72 64 64  BILITOT 1.0 0.5 0.3  PROT 7.2 6.2* 6.3*  ALBUMIN 3.0* 2.6* 2.7*   Recent Labs  Lab 06/12/19 1600  LIPASE 22   No results for input(s): AMMONIA in the last 168 hours. Coagulation Profile: No results for input(s): INR, PROTIME in the last 168 hours. Cardiac Enzymes: No results for input(s): CKTOTAL, CKMB, CKMBINDEX, TROPONINI in the last 168 hours. BNP (last 3 results) No results for input(s): PROBNP in the last 8760 hours. HbA1C: No results for input(s): HGBA1C in the last 72 hours. CBG: No results for input(s): GLUCAP in the last 168 hours. Lipid Profile: No results for input(s): CHOL, HDL, LDLCALC, TRIG, CHOLHDL, LDLDIRECT in the last 72 hours. Thyroid Function Tests: Recent Labs    06/12/19 1955  TSH 92.815*   Anemia Panel: Recent Labs    06/13/19 0348 06/14/19 0451  FERRITIN 63 69   Sepsis Labs: Recent Labs  Lab 06/12/19 1955  PROCALCITON <0.10    Recent Results (from the past 240 hour(s))  Culture, blood (Routine X 2) w Reflex to ID Panel     Status: None (Preliminary result)   Collection Time: 06/12/19  7:44 PM   Specimen: BLOOD LEFT ARM  Result Value Ref Range Status   Specimen Description BLOOD LEFT ARM  Final   Special Requests   Final    BOTTLES DRAWN AEROBIC AND ANAEROBIC Blood Culture results may not be optimal due to an inadequate volume of blood received in culture bottles   Culture   Final     NO GROWTH 2 DAYS Performed at [facility], [address] [place], [place] [address]    Report Status PENDING  Incomplete  Culture, blood (Routine X 2) w Reflex to ID Panel     Status: None (Preliminary result)   Collection Time: 06/12/19  8:05 PM   Specimen: BLOOD LEFT HAND  Result Value Ref Range Status   Specimen Description BLOOD LEFT HAND  Final   Special Requests   Final    BOTTLES DRAWN AEROBIC AND ANAEROBIC Blood Culture results may not be optimal due to an inadequate volume of blood received in culture bottles   Culture   Final    NO GROWTH 2 DAYS Performed at [facility], [address]  [place], [place] [address]    Report Status PENDING  Incomplete         Radiology Studies: Dg Chest Portable 1 View  Result Date: 06/12/2019 CLINICAL DATA:  85 year old female with vomiting. EXAM: PORTABLE CHEST 1 VIEW COMPARISON:  None. FINDINGS: Shallow inspiration with minimal bibasilar atelectasis. Mild eventration of the left hemidiaphragm. There is mild cardiomegaly and mild vascular congestion. No focal consolidation or pneumothorax. Mild diffuse interstitial nodularity noted. Developing atypical infection is not excluded. Clinical correlation is recommended. Apparent blunting of the left costophrenic angle, likely related to atelectasis. Trace left pleural effusion is not excluded. Clinical correlation is recommended. There is atherosclerotic calcification of the aorta. Median sternotomy wires and mechanical heart valve. No acute osseous pathology. IMPRESSION: 1. Mild cardiomegaly with mild vascular congestion. 2. Mild diffuse interstitial nodularity. Clinical correlation is recommended to evaluate for an atypical infection. No focal consolidation or pneumothorax. Electronically Signed   By: Elgie Collard M.D.   On: 06/12/2019 16:13        Scheduled Meds: . aspirin EC  81 mg Oral q morning - 10a  . dexamethasone (DECADRON) injection  6 mg Intravenous Q24H  .  enoxaparin (LOVENOX) injection  40 mg Subcutaneous Q24H  . feeding supplement (ENSURE ENLIVE)  237 mL Oral BID BM  . ferrous sulfate  325 mg Oral BID  . levothyroxine  112 mcg Oral Q0600  . lisinopril  10 mg Oral q morning - 10a  . multivitamin with minerals  1 tablet Oral Daily  . pantoprazole  40 mg Oral Daily  . polyethylene glycol  17 g Oral Daily  . sodium chloride flush  3 mL Intravenous Once  . vitamin C  500 mg Oral Daily  . zinc sulfate  220 mg Oral Daily   Continuous Infusions: . remdesivir 100 mg in NS 250 mL 100 mg (06/13/19 2038)     LOS: 2 days    Time spent: 30 minutes    Dorcas Carrow, MD [facility] Pager [phone]

## 2019-06-15 LAB — C-REACTIVE PROTEIN: CRP: 0.9 mg/dL (ref <1.0)

## 2019-06-15 LAB — COMPREHENSIVE METABOLIC PANEL
ALT: 8 U/L (ref 0–44)
AST: 18 U/L (ref 15–41)
Albumin: 2.7 g/dL — ABNORMAL LOW (ref 3.5–5.0)
Alkaline Phosphatase: 64 U/L (ref 38–126)
Anion gap: 10 (ref 5–15)
BUN: 26 mg/dL — ABNORMAL HIGH (ref 8–23)
CO2: 25 mmol/L (ref 22–32)
Calcium: 9 mg/dL (ref 8.9–10.3)
Chloride: 102 mmol/L (ref 98–111)
Creatinine, Ser: 1 mg/dL (ref 0.44–1.00)
GFR calc Af Amer: 57 mL/min — ABNORMAL LOW (ref >60)
GFR calc non Af Amer: 50 mL/min — ABNORMAL LOW (ref >60)
Glucose, Bld: 126 mg/dL — ABNORMAL HIGH (ref 70–99)
Potassium: 4.7 mmol/L (ref 3.5–5.1)
Sodium: 137 mmol/L (ref 135–145)
Total Bilirubin: 0.6 mg/dL (ref 0.3–1.2)
Total Protein: 6.4 g/dL — ABNORMAL LOW (ref 6.5–8.1)

## 2019-06-15 LAB — CBC WITH DIFFERENTIAL/PLATELET
Abs Immature Granulocytes: 0.15 10*3/uL — ABNORMAL HIGH (ref 0.00–0.07)
Basophils Absolute: 0 10*3/uL (ref 0.0–0.1)
Basophils Relative: 0 %
Eosinophils Absolute: 0 10*3/uL (ref 0.0–0.5)
Eosinophils Relative: 0 %
HCT: 32.6 % — ABNORMAL LOW (ref 36.0–46.0)
Hemoglobin: 10.5 g/dL — ABNORMAL LOW (ref 12.0–15.0)
Immature Granulocytes: 2 %
Lymphocytes Relative: 21 %
Lymphs Abs: 1.5 10*3/uL (ref 0.7–4.0)
MCH: 29.7 pg (ref 26.0–34.0)
MCHC: 32.2 g/dL (ref 30.0–36.0)
MCV: 92.1 fL (ref 80.0–100.0)
Monocytes Absolute: 0.4 10*3/uL (ref 0.1–1.0)
Monocytes Relative: 6 %
Neutro Abs: 5.1 10*3/uL (ref 1.7–7.7)
Neutrophils Relative %: 71 %
Platelets: 262 10*3/uL (ref 150–400)
RBC: 3.54 MIL/uL — ABNORMAL LOW (ref 3.87–5.11)
RDW: 15.4 % (ref 11.5–15.5)
WBC: 7.2 10*3/uL (ref 4.0–10.5)
nRBC: 0 % (ref 0.0–0.2)

## 2019-06-15 LAB — D-DIMER, QUANTITATIVE: D-Dimer, Quant: 2.97 ug/mL-FEU — ABNORMAL HIGH (ref 0.00–0.50)

## 2019-06-15 LAB — MAGNESIUM: Magnesium: 2 mg/dL (ref 1.7–2.4)

## 2019-06-15 LAB — FERRITIN: Ferritin: 67 ng/mL (ref 11–307)

## 2019-06-15 LAB — PHOSPHORUS: Phosphorus: 2.8 mg/dL (ref 2.5–4.6)

## 2019-06-15 MED ORDER — MAGNESIUM CITRATE PO SOLN
1.0000 | Freq: Once | ORAL | Status: AC
  Administered 2019-06-15: 1 via ORAL
  Filled 2019-06-15: qty 296

## 2019-06-15 MED ORDER — FAMOTIDINE IN NACL 20-0.9 MG/50ML-% IV SOLN
20.0000 mg | Freq: Once | INTRAVENOUS | Status: AC
  Administered 2019-06-15: 20 mg via INTRAVENOUS
  Filled 2019-06-15: qty 50

## 2019-06-15 MED ORDER — ALUM & MAG HYDROXIDE-SIMETH 200-200-20 MG/5ML PO SUSP
30.0000 mL | ORAL | Status: DC | PRN
  Administered 2019-06-15 (×2): 30 mL via ORAL
  Filled 2019-06-15 (×2): qty 30

## 2019-06-15 NOTE — Evaluation (Signed)
Occupational Therapy Evaluation Patient Details Name: Morgan Holland MRN: [id number] DOB: 1927/06/09 Today's Date: 06/15/2019    History of Present Illness 85 y.o. female with medical history significant of hypertension, coronary artery disease presents to emergency department due to nausea, vomiting, weakness and dizziness. Admitted from ED 06/12/19 with blood pressure elevated, bradycardic, on room air, COVID-19 positive, CBC, BMP, lipase: WNL.  Chest x-ray consistent with atypical pneumonia.   Clinical Impression   PTA pt living with granddaughter and great grandson. She was completing BADL independent with intermittent assist for IADL. She did state she was driving occasionally and paying bills. At time of eval, pt completed sit <> stands at min A-min guard with RW. Pt able to complete functional mobility to sink at min guard level to complete grooming and bathing. Pt completed these tasks in sitting with set up A, which is very close to her baseline. She will have 24/7 assist at home from family members. No f/u OT indicated at this time. Will continue to follow acutely per POC listed below.     Follow Up Recommendations  No OT follow up;Supervision/Assistance - 24 hour    Equipment Recommendations  3 in 1 bedside commode    Recommendations for Other Services       Precautions / Restrictions Precautions Precautions: Fall Precaution Comments: fell at home prior to admission Restrictions Weight Bearing Restrictions: No      Mobility Bed Mobility               General bed mobility comments: OOB in recliner on entry   Transfers Overall transfer level: Needs assistance Equipment used: Rolling walker (2 wheeled) Transfers: Sit to/from Stand Sit to Stand: Min assist;Min guard         General transfer comment: min A for steadying with power up, min guard with sturdy handles    Balance Overall balance assessment: Needs assistance Sitting-balance support: Feet  supported;No upper extremity supported Sitting balance-Leahy Scale: Fair Sitting balance - Comments: uncomfortable reaching outside her BoS with brushing teeth at the sink   Standing balance support: Bilateral upper extremity supported Standing balance-Leahy Scale: Poor Standing balance comment: requires UE support for static balance                           ADL either performed or assessed with clinical judgement   ADL Overall ADL's : Needs assistance/impaired Eating/Feeding: Set up;Sitting   Grooming: Set up;Sitting;Oral care;Wash/dry face Grooming Details (indicate cue type and reason): sitting in chair at sink (normally sits to do these tasks at baseline) Upper Body Bathing: Set up;Sitting Upper Body Bathing Details (indicate cue type and reason): sitting at sink, uses shower chair at baseline Lower Body Bathing: Min guard;Sitting/lateral leans;Sit to/from stand   Upper Body Dressing : Set up;Sitting   Lower Body Dressing: Min guard;Sitting/lateral leans;Sit to/from stand   Toilet Transfer: Min IT sales professional Details (indicate cue type and reason): able to compelte functional mobility at household distance to reach [employer]- Architect and Hygiene: Set up;Sitting/lateral lean;Sit to/from stand Toileting - Clothing Manipulation Details (indicate cue type and reason): able to clean peri area in standing with set up A Tub/ Shower Transfer: Min guard;Shower seat;Ambulation;Rolling walker   Functional mobility during ADLs: Min guard;Rolling walker General ADL Comments: pt close to baseline BADL, presenting with increased weakness with less activity tolerance during BADL     Vision Patient Visual Report: No change from baseline Vision Assessment?: No apparent visual  deficits     Perception     Praxis      Pertinent Vitals/Pain Pain Assessment: No/denies pain     Hand Dominance     Extremity/Trunk Assessment Upper Extremity  Assessment Upper Extremity Assessment: Generalized weakness   Lower Extremity Assessment Lower Extremity Assessment: Generalized weakness   Cervical / Trunk Assessment Cervical / Trunk Assessment: Kyphotic   Communication Communication Communication: No difficulties   Cognition Arousal/Alertness: Awake/alert Behavior During Therapy: WFL for tasks assessed/performed Overall Cognitive Status: Within Functional Limits for tasks assessed                                 General Comments: pt states her memory has not been as sharp lately. Some minor sequencing and STM deficits, but pt is aware and nothing seemed inappropriate for age related cognitive decline   General Comments  large contusion on L wrist from fall prior to admission, pt reported nausea after initially getting out of bed, no reports of nausea during session    Exercises     Shoulder Instructions      Home Living Family/patient expects to be discharged to:: Private residence Living Arrangements: Other relatives(grddtr also COVID pos) Available Help at Discharge: Family;Available 24 hours/day;Other (Comment)(will arrange for 24/7 care) Type of Home: House Home Access: Stairs to enter Entrance Stairs-Number of Steps: 1 step up the back   Home Layout: One level     Bathroom Shower/Tub: Teacher, early years/pre: Standard Bathroom Accessibility: Yes How Accessible: Accessible via walker Home Equipment: Shower seat;Grab bars - tub/shower;Hand held shower head;Walker - 4 wheels   Additional Comments: uses shower seat at baseline      Prior Functioning/Environment Level of Independence: Independent with assistive device(s)        Comments: doing all BADL and driving, states much changed since being dx with COVID; uses rollator at baseline        OT Problem List: Decreased strength;Decreased knowledge of use of DME or AE;Decreased activity tolerance;Cardiopulmonary status limiting  activity;Impaired balance (sitting and/or standing)      OT Treatment/Interventions: Self-care/ADL training;Therapeutic exercise;Patient/family education;Balance training;Energy conservation;Therapeutic activities;DME and/or AE instruction    OT Goals(Current goals can be found in the care plan section) Acute Rehab OT Goals Patient Stated Goal: get back home OT Goal Formulation: With patient Time For Goal Achievement: 06/29/19 Potential to Achieve Goals: Good  OT Frequency: Min 2X/week   Barriers to D/C:            Co-evaluation              AM-PAC OT "6 Clicks" Daily Activity     Outcome Measure Help from another person eating meals?: A Little Help from another person taking care of personal grooming?: A Little Help from another person toileting, which includes using toliet, bedpan, or urinal?: A Little Help from another person bathing (including washing, rinsing, drying)?: A Little Help from another person to put on and taking off regular upper body clothing?: None Help from another person to put on and taking off regular lower body clothing?: A Little 6 Click Score: 19   End of Session Equipment Utilized During Treatment: Gait belt;Rolling walker Nurse Communication: Mobility status  Activity Tolerance: Patient tolerated treatment well Patient left: in chair;with call bell/phone within reach;with chair alarm set  OT Visit Diagnosis: Unsteadiness on feet (R26.81);Other abnormalities of gait and mobility (R26.89);Muscle weakness (generalized) (M62.81);History of falling (Z91.81)  Time: 1433-1510 OT Time Calculation (min): 37 min Charges:  OT General Charges $OT Visit: 1 Visit OT Evaluation $OT Eval Low Complexity: 1 Low  Dalphine HandingKaylee Nomar Broad, MSOT, OTR/L Behavioral Health OT/ Acute Relief OT [facility] Office: [phone]   Dalphine HandingKaylee Seanna Sisler 06/15/2019, 5:26 PM

## 2019-06-15 NOTE — Evaluation (Signed)
Physical Therapy Evaluation Patient Details Name: Morgan Holland MRN: [id number] DOB: 01-Dec-1926 Today's Date: 06/15/2019   History of Present Illness  85 y.o. female with medical history significant of hypertension, coronary artery disease presents to emergency department due to nausea, vomiting, weakness and dizziness. Admitted from ED 06/12/19 with blood pressure elevated, bradycardic, on room air, COVID-19 positive, CBC, BMP, lipase: WNL.  Chest x-ray consistent with atypical pneumonia.  Clinical Impression  PTA granddaughter and great grandsons live with pt in single story home with one step to enter. Pt was completely independent in ADLs, ambulates with Rollator and "I still pay all of my bills" and drives. Pt is currently limited in safe mobility by self realized decreased memory in presence of generalized weakness and decreased endurance. Pt requires min A for transfers and min guard for ambulation with RW. Pt assures 24 hour assist is available at discharge. Pt does not require any follow up PT services at d/c however PT will continue to follow acutely to deter decline in level of function.      Follow Up Recommendations No PT follow up;Supervision/Assistance - 24 hour    Equipment Recommendations  None recommended by PT       Precautions / Restrictions Precautions Precautions: Fall Precaution Comments: fell at home prior to admission Restrictions Weight Bearing Restrictions: No      Mobility  Bed Mobility               General bed mobility comments: OOB in recliner on entry   Transfers Overall transfer level: Needs assistance Equipment used: Rolling walker (2 wheeled) Transfers: Sit to/from Stand Sit to Stand: Min assist         General transfer comment: min A for steadying with power up   Ambulation/Gait Ambulation/Gait assistance: Min guard Gait Distance (Feet): 12 Feet Assistive device: Rolling walker (2 wheeled) Gait Pattern/deviations: Step-through  pattern;Decreased step length - right;Decreased step length - left;Shuffle Gait velocity: slowed Gait velocity interpretation: <1.8 ft/sec, indicate of risk for recurrent falls General Gait Details: hands on min guard for safety, vc for proximity to RW, pt used to Rollator         Balance Overall balance assessment: Needs assistance Sitting-balance support: Feet supported;No upper extremity supported Sitting balance-Leahy Scale: Fair Sitting balance - Comments: uncomfortable reaching outside her BoS with brushing teeth at the sink   Standing balance support: Bilateral upper extremity supported Standing balance-Leahy Scale: Poor Standing balance comment: requires UE support for static balance                             Pertinent Vitals/Pain Pain Assessment: No/denies pain    Home Living Family/patient expects to be discharged to:: Private residence Living Arrangements: Other relatives(grddtr also COVID pos) Available Help at Discharge: Family;Available 24 hours/day;Other (Comment)(will arrange for 24/7 care) Type of Home: House Home Access: Stairs to enter   Entrance Stairs-Number of Steps: 1 step up the back Home Layout: One level Home Equipment: Shower seat;Grab bars - tub/shower;Hand held shower head;Walker - 4 wheels Additional Comments: uses shower seat at baseline    Prior Function Level of Independence: Independent with assistive device(s)         Comments: doing all BADL and driving, states much changed since being dx with COVID; uses rollator at baseline     Hand Dominance        Extremity/Trunk Assessment   Upper Extremity Assessment Upper Extremity Assessment: Generalized weakness  Lower Extremity Assessment Lower Extremity Assessment: Generalized weakness    Cervical / Trunk Assessment Cervical / Trunk Assessment: Kyphotic  Communication   Communication: No difficulties  Cognition Arousal/Alertness: Awake/alert Behavior During  Therapy: WFL for tasks assessed/performed Overall Cognitive Status: Within Functional Limits for tasks assessed                                 General Comments: pt reports not being as sharp as she was before COVID, only evident on a few instances like asking to have her back washed when it had just been washed      General Comments General comments (skin integrity, edema, etc.): large contusion on L wrist from fall prior to admission, pt reported nausea after initially getting out of bed, no reports of nausea during session        Assessment/Plan    PT Assessment Patient needs continued PT services  PT Problem List Decreased strength;Decreased activity tolerance;Decreased balance;Decreased mobility;Decreased knowledge of use of DME;Decreased cognition       PT Treatment Interventions DME instruction;Gait training;Functional mobility training;Therapeutic activities;Therapeutic exercise;Balance training;Cognitive remediation;Patient/family education    PT Goals (Current goals can be found in the Care Plan section)  Acute Rehab PT Goals Patient Stated Goal: get back home PT Goal Formulation: With patient Time For Goal Achievement: 06/29/19 Potential to Achieve Goals: Good    Frequency Min 3X/week    AM-PAC PT "6 Clicks" Mobility  Outcome Measure Help needed turning from your back to your side while in a flat bed without using bedrails?: None Help needed moving from lying on your back to sitting on the side of a flat bed without using bedrails?: A Little Help needed moving to and from a bed to a chair (including a wheelchair)?: A Little Help needed standing up from a chair using your arms (e.g., wheelchair or bedside chair)?: A Little Help needed to walk in hospital room?: A Little Help needed climbing 3-5 steps with a railing? : A Lot 6 Click Score: 18    End of Session   Activity Tolerance: Patient tolerated treatment well Patient left: in chair;with call  bell/phone within reach;with chair alarm set Nurse Communication: Mobility status PT Visit Diagnosis: Unsteadiness on feet (R26.81);Other abnormalities of gait and mobility (R26.89);Muscle weakness (generalized) (M62.81);History of falling (Z91.81);Difficulty in walking, not elsewhere classified (R26.2)    Time: 1610-9604 PT Time Calculation (min) (ACUTE ONLY): 32 min   Charges:   PT Evaluation $PT Eval Moderate Complexity: 1 Mod          Zakiyyah Savannah B. Beverely Risen PT, DPT Acute Rehabilitation Services Pager [phone] Office [phone]   [facility] 06/15/2019, 4:38 PM

## 2019-06-15 NOTE — Progress Notes (Signed)
PROGRESS NOTE    Morgan Holland  DXI:[id number] DOB: [birth year] DOA: 06/12/2019 PCP: Patient, No Pcp Per    Brief Narrative:  85 year old female with history of hypertension, coronary artery disease presented to the emergency room with nausea, vomiting and dizziness.  Patient lives at home with her granddaughter who was recently diagnosed with COVID-19.  She complaint of fever and cough and congestion.  In the ER she was bradycardic, COVID-19 positive.  Chest x-ray consistent with bilateral atypical pneumonia.   Assessment & Plan:   Principal Problem:   Pneumonia due to COVID-19 virus Active Problems:   Hypertension   Bradycardia   Hypothyroidism   Pressure injury of skin  Pneumonia due to COVID-19 virus: Continue to monitor due to significant symptoms . chest physiotherapy, incentive spirometry, deep breathing exercises, sputum induction, mucolytic's and bronchodilators. Supplemental oxygen to keep saturations more than 90%.  Mostly on room air. Covid directed therapy with , steroids dexamethasone Remdesivi day 4/5 antibiotics not indicated. Due to severity of symptoms, patient will need daily inflammatory markers, chest x-rays, liver function test to monitor and direct COVID-19 therapies.  Hypothyroidism: History of hypothyroidism.  TSH 89.5.  Probably not taking thyroxine.  Dose increased. Will need outpatient follow-up and repeat TSH in 1 month.  Bradycardia: Heart rate reported 40s at the time of admission.  Metoprolol discontinued.  Heart rate improved.  Hypertension: Blood pressure stable on lisinopril and hydralazine.  Vertigo: chronic and extensively investigated in the past.  Using meclizine as needed.  Will start when she works with therapy.  DVT prophylaxis: Lovenox subcu Code Status: Full code Family Communication: Granddaughter, Ms. Lamar Sprinkles called and updated. Disposition Plan: Home.  Anticipate tomorrow if remains clinically stable.  Start working with PT OT.    Consultants:   None  Procedures:   None  Antimicrobials:  Antibiotics Given (last 72 hours)    Date/Time Action Medication Dose Rate   06/12/19 2206 New Bag/Given   remdesivir 200 mg in sodium chloride 0.9 % 250 mL IVPB 200 mg 500 mL/hr   06/13/19 2038 New Bag/Given   remdesivir 100 mg in sodium chloride 0.9 % 250 mL IVPB 100 mg 500 mL/hr   06/14/19 1930 New Bag/Given   remdesivir 100 mg in sodium chloride 0.9 % 250 mL IVPB 100 mg 500 mL/hr         Subjective: Patient seen and examined.  No overnight events.  She is still struggling with constipation.  "Give me something to break it loose and everything come out".  On room air.  Has some dry cough.  Afebrile.  Dizziness is gone, nausea is improved.  She is afraid about vertigo when she gets up and walks.  Objective: Vitals:   06/14/19 0815 06/14/19 1659 06/14/19 2248 06/15/19 0800  BP: (!) 150/95 (!) 146/91 (!) 160/76 (!) 147/85  Pulse: 78 79 63 80  Resp: 14 19 20 16   Temp: 98.3 F (36.8 C) 98 F (36.7 C) 98 F (36.7 C) 98.3 F (36.8 C)  TempSrc:    Oral  SpO2: 99% 99% 94% 98%  Weight:      Height:        Intake/Output Summary (Last 24 hours) at 06/15/2019 1045 Last data filed at 06/15/2019 0900 Gross per 24 hour  Intake 480 ml  Output -  Net 480 ml   Filed Weights   06/12/19 2056  Weight: 62.5 kg    Examination:  General exam: Appears calm and comfortable, on room air, hard of hearing. Respiratory  system: Clear to auscultation. Respiratory effort normal. Cardiovascular system: S1 & S2 heard, RRR. No JVD, murmurs, rubs, gallops or clicks. No pedal edema. Gastrointestinal system: Abdomen is nondistended, soft and nontender. No organomegaly or masses felt. Normal bowel sounds heard. Central nervous system: Alert and oriented. No focal neurological deficits. Extremities: Symmetric 5 x 5 power. Skin: No rashes, lesions or ulcers Psychiatry: Judgement and insight appear normal. Mood & affect appropriate.      Data Reviewed: I have personally reviewed following labs and imaging studies  CBC: Recent Labs  Lab 06/12/19 1352 06/12/19 1955 06/13/19 0348 06/14/19 0451 06/15/19 0427  WBC 6.8 6.1 5.0 4.6 7.2  NEUTROABS  --   --  2.0 2.9 5.1  HGB 11.2* 11.1* 10.6* 10.8* 10.5*  HCT 36.3 34.3* 32.4* 33.0* 32.6*  MCV 96.8 92.0 91.3 91.2 92.1  PLT 262 243 249 265 253   Basic Metabolic Panel: Recent Labs  Lab 06/12/19 1352 06/12/19 1955 06/13/19 0348 06/14/19 0451 06/15/19 0427  NA 138 137 141 137 137  K 3.8 3.6 3.6 4.1 4.7  CL 101 103 106 101 102  CO2 25 24 25 24 25   GLUCOSE 112* 102* 117* 129* 126*  BUN 12 13 12 18  26*  CREATININE 0.97 0.87 0.93 0.89 1.00  CALCIUM 8.7* 8.6* 8.3* 8.4* 9.0  MG  --   --  1.5* 2.1 2.0  PHOS  --   --  2.6 3.4 2.8   GFR: Estimated Creatinine Clearance: 33.6 mL/min (by C-G formula based on SCr of 1 mg/dL). Liver Function Tests: Recent Labs  Lab 06/12/19 1955 06/13/19 0348 06/14/19 0451 06/15/19 0427  AST 26 20 20 18   ALT 9 6 8 8   ALKPHOS 72 64 64 64  BILITOT 1.0 0.5 0.3 0.6  PROT 7.2 6.2* 6.3* 6.4*  ALBUMIN 3.0* 2.6* 2.7* 2.7*   Recent Labs  Lab 06/12/19 1600  LIPASE 22   No results for input(s): AMMONIA in the last 168 hours. Coagulation Profile: No results for input(s): INR, PROTIME in the last 168 hours. Cardiac Enzymes: No results for input(s): CKTOTAL, CKMB, CKMBINDEX, TROPONINI in the last 168 hours. BNP (last 3 results) No results for input(s): PROBNP in the last 8760 hours. HbA1C: No results for input(s): HGBA1C in the last 72 hours. CBG: No results for input(s): GLUCAP in the last 168 hours. Lipid Profile: No results for input(s): CHOL, HDL, LDLCALC, TRIG, CHOLHDL, LDLDIRECT in the last 72 hours. Thyroid Function Tests: Recent Labs    06/12/19 1955  TSH 92.815*   Anemia Panel: Recent Labs    06/14/19 0451 06/15/19 0427  FERRITIN 69 63   Sepsis Labs: Recent Labs  Lab 06/12/19 1955  PROCALCITON <0.10     Recent Results (from the past 240 hour(s))  Culture, blood (Routine X 2) w Reflex to ID Panel     Status: None (Preliminary result)   Collection Time: 06/12/19  7:44 PM   Specimen: BLOOD LEFT ARM  Result Value Ref Range Status   Specimen Description BLOOD LEFT ARM  Final   Special Requests   Final    BOTTLES DRAWN AEROBIC AND ANAEROBIC Blood Culture results may not be optimal due to an inadequate volume of blood received in culture bottles   Culture   Final    NO GROWTH 3 DAYS Performed at [facility], [place], [place], [place] [address]    Report Status PENDING  Incomplete  Culture, blood (Routine X 2) w Reflex to ID Panel  Status: None (Preliminary result)   Collection Time: 06/12/19  8:05 PM   Specimen: BLOOD LEFT HAND  Result Value Ref Range Status   Specimen Description BLOOD LEFT HAND  Final   Special Requests   Final    BOTTLES DRAWN AEROBIC AND ANAEROBIC Blood Culture results may not be optimal due to an inadequate volume of blood received in culture bottles   Culture   Final    NO GROWTH 3 DAYS Performed at [facility], [address] [place], [place] [address]    Report Status PENDING  Incomplete         Radiology Studies: No results found.      Scheduled Meds: . aspirin EC  81 mg Oral q morning - 10a  . dexamethasone (DECADRON) injection  6 mg Intravenous Q24H  . enoxaparin (LOVENOX) injection  40 mg Subcutaneous Q24H  . feeding supplement (ENSURE ENLIVE)  237 mL Oral BID BM  . ferrous sulfate  325 mg Oral BID  . levothyroxine  112 mcg Oral Q0600  . lisinopril  10 mg Oral q morning - 10a  . magnesium citrate  1 Bottle Oral Once  . multivitamin with minerals  1 tablet Oral Daily  . pantoprazole  40 mg Oral Daily  . polyethylene glycol  17 g Oral Daily  . sodium chloride flush  3 mL Intravenous Once  . vitamin C  500 mg Oral Daily  . zinc sulfate  220 mg Oral Daily   Continuous Infusions: . remdesivir 100 mg in NS 250 mL 100 mg  (06/14/19 1930)     LOS: 3 days    Time spent: 30 minutes    Dorcas CarrowKuber Tarnisha Kachmar, MD [facility] Pager [phone]

## 2019-06-15 NOTE — TOC Initial Note (Signed)
Transition of Care [facility]) - Initial/Assessment Note    Patient Details  Name: Morgan Holland MRN: [id number] Date of Birth: 1927/06/01  [facility] CM/SW Contact:    Maryclare Labrador, RN Phone Number: 06/15/2019, 4:24 PM  Clinical Narrative:  CM spoke with pt via phone, pt alert and oriented.  Pt informed CM that she lives her granddaughter.  Pt informed CM that she is completely independent and still drives.  Pt has PCP and denied barriers with paying for medications. CM text paged PT to inform of pending discharge.                 Expected Discharge Plan: Home/Self Care Barriers to Discharge: Continued Medical Work up   Patient Goals and CMS Choice        Expected Discharge Plan and Services Expected Discharge Plan: Home/Self Care                                              Prior Living Arrangements/Services     Patient language and need for interpreter reviewed:: Yes Do you feel safe going back to the place where you live?: Yes      Need for Family Participation in Patient Care: Yes (Comment) Care giver support system in place?: Yes (comment) Current home services: DME(walker if needed) Criminal Activity/Legal Involvement Pertinent to Current Situation/Hospitalization: No - Comment as needed  Activities of Daily Living Home Assistive Devices/Equipment: Walker (specify type)(front wheel) ADL Screening (condition at time of admission) Patient's cognitive ability adequate to safely complete daily activities?: Yes Is the patient deaf or have difficulty hearing?: Yes Does the patient have difficulty seeing, even when wearing glasses/contacts?: No Does the patient have difficulty concentrating, remembering, or making decisions?: No Patient able to express need for assistance with ADLs?: Yes Does the patient have difficulty dressing or bathing?: No Independently performs ADLs?: Yes (appropriate for developmental age) Does the patient have  difficulty walking or climbing stairs?: Yes(dizziness on admission) Weakness of Legs: None Weakness of Arms/Hands: None  Permission Sought/Granted                  Emotional Assessment   Attitude/Demeanor/Rapport: Gracious, Self-Confident, Engaged, Charismatic Affect (typically observed): Accepting Orientation: : Oriented to Self, Oriented to Place, Oriented to  Time, Oriented to Situation   Psych Involvement: No (comment)  Admission diagnosis:  Bradycardia [R00.1] Nausea and vomiting, intractability of vomiting not specified, unspecified vomiting type [R11.2] Patient Active Problem List   Diagnosis Date Noted  . Pressure injury of skin 06/13/2019  . Pneumonia due to COVID-19 virus 06/12/2019  . Hypothyroidism 06/12/2019  . Hypertension   . Bradycardia    PCP:  Patient, No Pcp Per Pharmacy:   [id number] - [place], [place] AT [address] [place] [address] Phone: [phone] Fax: [phone]     Social Determinants of Health (SDOH) Interventions    Readmission Risk Interventions No flowsheet data found.

## 2019-06-16 LAB — COMPREHENSIVE METABOLIC PANEL
ALT: 9 U/L (ref 0–44)
AST: 20 U/L (ref 15–41)
Albumin: 2.7 g/dL — ABNORMAL LOW (ref 3.5–5.0)
Alkaline Phosphatase: 71 U/L (ref 38–126)
Anion gap: 10 (ref 5–15)
BUN: 29 mg/dL — ABNORMAL HIGH (ref 8–23)
CO2: 26 mmol/L (ref 22–32)
Calcium: 8.8 mg/dL — ABNORMAL LOW (ref 8.9–10.3)
Chloride: 100 mmol/L (ref 98–111)
Creatinine, Ser: 1.01 mg/dL — ABNORMAL HIGH (ref 0.44–1.00)
GFR calc Af Amer: 57 mL/min — ABNORMAL LOW (ref >60)
GFR calc non Af Amer: 49 mL/min — ABNORMAL LOW (ref >60)
Glucose, Bld: 136 mg/dL — ABNORMAL HIGH (ref 70–99)
Potassium: 4.5 mmol/L (ref 3.5–5.1)
Sodium: 136 mmol/L (ref 135–145)
Total Bilirubin: 0.6 mg/dL (ref 0.3–1.2)
Total Protein: 6.3 g/dL — ABNORMAL LOW (ref 6.5–8.1)

## 2019-06-16 LAB — C-REACTIVE PROTEIN: CRP: 0.9 mg/dL (ref <1.0)

## 2019-06-16 LAB — FERRITIN: Ferritin: 71 ng/mL (ref 11–307)

## 2019-06-16 LAB — CBC WITH DIFFERENTIAL/PLATELET
Abs Immature Granulocytes: 0.15 10*3/uL — ABNORMAL HIGH (ref 0.00–0.07)
Basophils Absolute: 0 10*3/uL (ref 0.0–0.1)
Basophils Relative: 0 %
Eosinophils Absolute: 0 10*3/uL (ref 0.0–0.5)
Eosinophils Relative: 0 %
HCT: 32.3 % — ABNORMAL LOW (ref 36.0–46.0)
Hemoglobin: 10.5 g/dL — ABNORMAL LOW (ref 12.0–15.0)
Immature Granulocytes: 1 %
Lymphocytes Relative: 15 %
Lymphs Abs: 1.6 10*3/uL (ref 0.7–4.0)
MCH: 29.8 pg (ref 26.0–34.0)
MCHC: 32.5 g/dL (ref 30.0–36.0)
MCV: 91.8 fL (ref 80.0–100.0)
Monocytes Absolute: 0.8 10*3/uL (ref 0.1–1.0)
Monocytes Relative: 8 %
Neutro Abs: 7.9 10*3/uL — ABNORMAL HIGH (ref 1.7–7.7)
Neutrophils Relative %: 76 %
Platelets: 310 10*3/uL (ref 150–400)
RBC: 3.52 MIL/uL — ABNORMAL LOW (ref 3.87–5.11)
RDW: 15.7 % — ABNORMAL HIGH (ref 11.5–15.5)
WBC: 10.5 10*3/uL (ref 4.0–10.5)
nRBC: 0 % (ref 0.0–0.2)

## 2019-06-16 LAB — MAGNESIUM: Magnesium: 2.3 mg/dL (ref 1.7–2.4)

## 2019-06-16 LAB — D-DIMER, QUANTITATIVE: D-Dimer, Quant: 9.78 ug/mL-FEU — ABNORMAL HIGH (ref 0.00–0.50)

## 2019-06-16 LAB — PHOSPHORUS: Phosphorus: 2.7 mg/dL (ref 2.5–4.6)

## 2019-06-16 MED ORDER — MECLIZINE HCL 12.5 MG PO TABS: 12.5000 mg | ORAL_TABLET | Freq: Three times a day (TID) | ORAL | 0 refills | Status: AC | PRN

## 2019-06-16 MED ORDER — METOPROLOL TARTRATE 50 MG PO TABS: 50.0000 mg | ORAL_TABLET | Freq: Two times a day (BID) | ORAL | 0 refills | Status: DC

## 2019-06-16 MED ORDER — LEVOTHYROXINE SODIUM 112 MCG PO TABS: 112.0000 ug | ORAL_TABLET | Freq: Every day | ORAL | 0 refills | Status: DC

## 2019-06-16 MED ORDER — DEXAMETHASONE 6 MG PO TABS: 6.0000 mg | ORAL_TABLET | Freq: Every day | ORAL | 0 refills | Status: AC

## 2019-06-16 MED ORDER — SODIUM CHLORIDE 0.9 % IV SOLN
100.0000 mg | Freq: Every day | INTRAVENOUS | Status: AC
  Administered 2019-06-16: 100 mg via INTRAVENOUS
  Filled 2019-06-16 (×2): qty 20

## 2019-06-16 NOTE — Discharge Summary (Signed)
Physician Discharge Summary  Norberto Sorensonlizbeth Monarrez GLO:[id number] DOB: 05/31/1927 DOA: 06/12/2019  PCP: Patient, No Pcp Per  Admit date: 06/12/2019 Discharge date: 06/16/2019  Admitted From: home  Disposition:  Home   Recommendations for Outpatient Follow-up:  1. Follow up with PCP in 1-2 weeks   Home Health:NA  Equipment/Devices:NA   Discharge Condition:stable   CODE STATUS:Full code  Diet recommendation: low salt diet   Discharge summary: 85 year old female with history of hypertension, coronary artery disease presented to the emergency room with nausea, vomiting and dizziness.  Patient lives at home with her granddaughter who was recently diagnosed with COVID-19.  She complaint of fever and cough and congestion.  In the ER she was bradycardic, COVID-19 positive.  Chest x-ray consistent with bilateral atypical pneumonia.  Pneumonia due to COVID-19 virus: Patient did very good clinical recovery.  She did not need any supplemental oxygen.  Now with minimal pulmonary symptoms, has some dry cough.   Received 5 days of remdesivir.   Received dexamethasone, changed to oral and finished 10 days of therapy.   Inflammatory markers trending down.   Going home, all isolation precautions, over-the-counter cough medications and quarantine explained and educated to patient and family.  Very well aware.   Hypothyroidism: History of hypothyroidism.  TSH 89.5.  Probably undertreated.  Patient also has profound constipation.  Will increase dose 112 mcg daily. Will need outpatient follow-up and repeat TSH in 1 month.  Bradycardia: Heart rate reported 40s at the time of admission.    Metoprolol was on hold.Marland Kitchen.  Heart rate improved and mostly remains more than 70.  We will decrease the dose of metoprolol by half.  Hypertension: Blood pressure stable on lisinopril and hydralazine.  Vertigo: chronic and extensively investigated in the past.  Using meclizine as needed.  prescribed.  Patient with adequate  clinical improvement.  Going home today with her family.   Discharge Diagnoses:  Principal Problem:   Pneumonia due to COVID-19 virus Active Problems:   Hypertension   Bradycardia   Hypothyroidism   Pressure injury of skin    Discharge Instructions  Discharge Instructions    Call MD for:  difficulty breathing, headache or visual disturbances   Complete by: As directed    Call MD for:  temperature >100.4   Complete by: As directed    Diet - low sodium heart healthy   Complete by: As directed    Increase activity slowly   Complete by: As directed    MyChart COVID-19 home monitoring program   Complete by: Jun 16, 2019    Is the patient willing to use the MyChart Mobile App for home monitoring?: Yes   Temperature monitoring   Complete by: Jun 16, 2019    After how many days would you like to receive a notification of this patient's flowsheet entries?: 1     Allergies as of 06/16/2019   No Known Allergies     Medication List    TAKE these medications   acetaminophen 325 MG tablet Commonly known as: TYLENOL Take 650 mg by mouth every 6 (six) hours as needed for headache (pain).   aspirin EC 81 MG tablet Take 81 mg by mouth every morning.   dexamethasone 6 MG tablet Commonly known as: DECADRON Take 1 tablet (6 mg total) by mouth daily for 6 days.   ferrous sulfate 325 (65 FE) MG EC tablet Take 1 tablet by mouth 2 (two) times daily.   levothyroxine 112 MCG tablet Commonly known as: SYNTHROID Take  1 tablet (112 mcg total) by mouth daily at 6 (six) AM. Start taking on: June 17, 2019 What changed:   medication strength  how much to take  when to take this   lisinopril 10 MG tablet Commonly known as: ZESTRIL Take 10 mg by mouth every morning.   meclizine 12.5 MG tablet Commonly known as: ANTIVERT Take 1 tablet (12.5 mg total) by mouth 3 (three) times daily as needed for dizziness.   metoprolol tartrate 50 MG tablet Commonly known as: LOPRESSOR Take 1  tablet (50 mg total) by mouth 2 (two) times daily. What changed:   medication strength  how much to take   omeprazole 40 MG capsule Commonly known as: PRILOSEC Take 40 mg by mouth daily as needed (acid reflux/indigestion).   triamcinolone cream 0.1 % Commonly known as: KENALOG Apply 1 application topically See admin instructions. Apply topically to chin daily as needed for itching/rash       No Known Allergies  Consultations:  None.   Procedures/Studies: Dg Chest Portable 1 View  Result Date: 06/12/2019 CLINICAL DATA:  85 year old female with vomiting. EXAM: PORTABLE CHEST 1 VIEW COMPARISON:  None. FINDINGS: Shallow inspiration with minimal bibasilar atelectasis. Mild eventration of the left hemidiaphragm. There is mild cardiomegaly and mild vascular congestion. No focal consolidation or pneumothorax. Mild diffuse interstitial nodularity noted. Developing atypical infection is not excluded. Clinical correlation is recommended. Apparent blunting of the left costophrenic angle, likely related to atelectasis. Trace left pleural effusion is not excluded. Clinical correlation is recommended. There is atherosclerotic calcification of the aorta. Median sternotomy wires and mechanical heart valve. No acute osseous pathology. IMPRESSION: 1. Mild cardiomegaly with mild vascular congestion. 2. Mild diffuse interstitial nodularity. Clinical correlation is recommended to evaluate for an atypical infection. No focal consolidation or pneumothorax. Electronically Signed   By: Elgie Collard M.D.   On: 06/12/2019 16:13    Subjective: Patient seen and examined.  No overnight events.  She had a very good response after a dose of magnesium citrate and was very happy to have good bowel movement.  Remains afebrile.  Mostly on room air.  She does have some dry cough.  Eager to go home with granddaughter. Called and updated patient's granddaughter, new medications reviewed.   Discharge Exam: Vitals:    06/15/19 1737 06/16/19 0012  BP: 117/81 110/74  Pulse: 75 76  Resp: 18 16  Temp: 97.6 F (36.4 C) 97.8 F (36.6 C)  SpO2: 99% 93%   Vitals:   06/14/19 2248 06/15/19 0800 06/15/19 1737 06/16/19 0012  BP: (!) 160/76 (!) 147/85 117/81 110/74  Pulse: 63 80 75 76  Resp: 20 16 18 16   Temp: 98 F (36.7 C) 98.3 F (36.8 C) 97.6 F (36.4 C) 97.8 F (36.6 C)  TempSrc:  Oral Oral   SpO2: 94% 98% 99% 93%  Weight:      Height:        General: Pt is alert, awake, not in acute distress, on room air.  Hard of hearing. Cardiovascular: RRR, S1/S2 +, no rubs, no gallops Respiratory: CTA bilaterally, no wheezing, no rhonchi Abdominal: Soft, NT, ND, bowel sounds + Extremities: no edema, no cyanosis    The results of significant diagnostics from this hospitalization (including imaging, microbiology, ancillary and laboratory) are listed below for reference.     Microbiology: Recent Results (from the past 240 hour(s))  Culture, blood (Routine X 2) w Reflex to ID Panel     Status: None (Preliminary result)   Collection  Time: 06/12/19  7:44 PM   Specimen: BLOOD LEFT ARM  Result Value Ref Range Status   Specimen Description BLOOD LEFT ARM  Final   Special Requests   Final    BOTTLES DRAWN AEROBIC AND ANAEROBIC Blood Culture results may not be optimal due to an inadequate volume of blood received in culture bottles   Culture   Final    NO GROWTH 4 DAYS Performed at [facility], [place] [address] [place], [place] [address]    Report Status PENDING  Incomplete  Culture, blood (Routine X 2) w Reflex to ID Panel     Status: None (Preliminary result)   Collection Time: 06/12/19  8:05 PM   Specimen: BLOOD LEFT HAND  Result Value Ref Range Status   Specimen Description BLOOD LEFT HAND  Final   Special Requests   Final    BOTTLES DRAWN AEROBIC AND ANAEROBIC Blood Culture results may not be optimal due to an inadequate volume of blood received in culture bottles   Culture   Final    NO  GROWTH 4 DAYS Performed at [facility], [place] [address], [place], [place] [address]    Report Status PENDING  Incomplete     Labs: BNP (last 3 results) Recent Labs    06/12/19 1950  BNP 355.7*   Basic Metabolic Panel: Recent Labs  Lab 06/12/19 1955 06/13/19 0348 06/14/19 0451 06/15/19 0427 06/16/19 0323  NA 137 141 137 137 136  K 3.6 3.6 4.1 4.7 4.5  CL 103 106 101 102 100  CO2 24 25 24 25 26   GLUCOSE 102* 117* 129* 126* 136*  BUN 13 12 18  26* 29*  CREATININE 0.87 0.93 0.89 1.00 1.01*  CALCIUM 8.6* 8.3* 8.4* 9.0 8.8*  MG  --  1.5* 2.1 2.0 2.3  PHOS  --  2.6 3.4 2.8 2.7   Liver Function Tests: Recent Labs  Lab 06/12/19 1955 06/13/19 0348 06/14/19 0451 06/15/19 0427 06/16/19 0323  AST 26 20 20 18 20   ALT 9 6 8 8 9   ALKPHOS 72 64 64 64 71  BILITOT 1.0 0.5 0.3 0.6 0.6  PROT 7.2 6.2* 6.3* 6.4* 6.3*  ALBUMIN 3.0* 2.6* 2.7* 2.7* 2.7*   Recent Labs  Lab 06/12/19 1600  LIPASE 22   No results for input(s): AMMONIA in the last 168 hours. CBC: Recent Labs  Lab 06/12/19 1955 06/13/19 0348 06/14/19 0451 06/15/19 0427 06/16/19 0323  WBC 6.1 5.0 4.6 7.2 10.5  NEUTROABS  --  2.0 2.9 5.1 7.9*  HGB 11.1* 10.6* 10.8* 10.5* 10.5*  HCT 34.3* 32.4* 33.0* 32.6* 32.3*  MCV 92.0 91.3 91.2 92.1 91.8  PLT 243 249 265 262 310   Cardiac Enzymes: No results for input(s): CKTOTAL, CKMB, CKMBINDEX, TROPONINI in the last 168 hours. BNP: Invalid input(s): POCBNP CBG: No results for input(s): GLUCAP in the last 168 hours. D-Dimer Recent Labs    06/15/19 0427 06/16/19 0323  DDIMER 2.97* 9.78*   Hgb A1c No results for input(s): HGBA1C in the last 72 hours. Lipid Profile No results for input(s): CHOL, HDL, LDLCALC, TRIG, CHOLHDL, LDLDIRECT in the last 72 hours. Thyroid function studies No results for input(s): TSH, T4TOTAL, T3FREE, THYROIDAB in the last 72 hours.  Invalid input(s): FREET3 Anemia work up Recent Labs    06/15/19 0427 06/16/19 0323  FERRITIN  67 71   Urinalysis    Component Value Date/Time   COLORURINE YELLOW 06/12/2019 2040   APPEARANCEUR CLEAR 06/12/2019 2040  LABSPEC 1.004 (L) 06/12/2019 2040   PHURINE 7.0 06/12/2019 2040   GLUCOSEU NEGATIVE 06/12/2019 2040   HGBUR NEGATIVE 06/12/2019 2040   BILIRUBINUR NEGATIVE 06/12/2019 2040   KETONESUR NEGATIVE 06/12/2019 2040   PROTEINUR NEGATIVE 06/12/2019 2040   NITRITE NEGATIVE 06/12/2019 2040   LEUKOCYTESUR TRACE (A) 06/12/2019 2040   Sepsis Labs Invalid input(s): PROCALCITONIN,  WBC,  LACTICIDVEN Microbiology Recent Results (from the past 240 hour(s))  Culture, blood (Routine X 2) w Reflex to ID Panel     Status: None (Preliminary result)   Collection Time: 06/12/19  7:44 PM   Specimen: BLOOD LEFT ARM  Result Value Ref Range Status   Specimen Description BLOOD LEFT ARM  Final   Special Requests   Final    BOTTLES DRAWN AEROBIC AND ANAEROBIC Blood Culture results may not be optimal due to an inadequate volume of blood received in culture bottles   Culture   Final    NO GROWTH 4 DAYS Performed at [facility], [address], [place], [place] [address]    Report Status PENDING  Incomplete  Culture, blood (Routine X 2) w Reflex to ID Panel     Status: None (Preliminary result)   Collection Time: 06/12/19  8:05 PM   Specimen: BLOOD LEFT HAND  Result Value Ref Range Status   Specimen Description BLOOD LEFT HAND  Final   Special Requests   Final    BOTTLES DRAWN AEROBIC AND ANAEROBIC Blood Culture results may not be optimal due to an inadequate volume of blood received in culture bottles   Culture   Final    NO GROWTH 4 DAYS Performed at [facility], [address], [place], [place] [address]    Report Status PENDING  Incomplete     Time coordinating discharge:  40 minutes  SIGNED:   Dorcas Carrow, MD  [facility] 06/16/2019, 10:01 AM

## 2019-06-17 LAB — CULTURE, BLOOD (ROUTINE X 2)
Culture: NO GROWTH
Culture: NO GROWTH

## 2020-03-12 ENCOUNTER — Inpatient Hospital Stay (HOSPITAL_BASED_OUTPATIENT_CLINIC_OR_DEPARTMENT_OTHER)
Admission: EM | Admit: 2020-03-12 | Discharge: 2020-03-16 | DRG: 291 | Disposition: A | Discharge status: Home Health | Payer: Medicare Other | Attending: Internal Medicine | Admitting: Internal Medicine

## 2020-03-12 ENCOUNTER — Other Ambulatory Visit: Payer: Self-pay

## 2020-03-12 ENCOUNTER — Emergency Department (HOSPITAL_BASED_OUTPATIENT_CLINIC_OR_DEPARTMENT_OTHER): Payer: Medicare Other

## 2020-03-12 ENCOUNTER — Encounter (HOSPITAL_BASED_OUTPATIENT_CLINIC_OR_DEPARTMENT_OTHER): Payer: Self-pay

## 2020-03-12 DIAGNOSIS — Z79890 Hormone replacement therapy (postmenopausal): Secondary | ICD-10-CM

## 2020-03-12 DIAGNOSIS — K219 Gastro-esophageal reflux disease without esophagitis: Secondary | ICD-10-CM | POA: Diagnosis present

## 2020-03-12 DIAGNOSIS — I110 Hypertensive heart disease with heart failure: Principal | ICD-10-CM | POA: Diagnosis present

## 2020-03-12 DIAGNOSIS — D509 Iron deficiency anemia, unspecified: Secondary | ICD-10-CM

## 2020-03-12 DIAGNOSIS — I083 Combined rheumatic disorders of mitral, aortic and tricuspid valves: Secondary | ICD-10-CM | POA: Diagnosis present

## 2020-03-12 DIAGNOSIS — I248 Other forms of acute ischemic heart disease: Secondary | ICD-10-CM | POA: Diagnosis present

## 2020-03-12 DIAGNOSIS — Z8249 Family history of ischemic heart disease and other diseases of the circulatory system: Secondary | ICD-10-CM

## 2020-03-12 DIAGNOSIS — E039 Hypothyroidism, unspecified: Secondary | ICD-10-CM | POA: Diagnosis present

## 2020-03-12 DIAGNOSIS — I080 Rheumatic disorders of both mitral and aortic valves: Secondary | ICD-10-CM

## 2020-03-12 DIAGNOSIS — Z8616 Personal history of COVID-19: Secondary | ICD-10-CM

## 2020-03-12 DIAGNOSIS — D500 Iron deficiency anemia secondary to blood loss (chronic): Secondary | ICD-10-CM | POA: Diagnosis present

## 2020-03-12 DIAGNOSIS — K922 Gastrointestinal hemorrhage, unspecified: Secondary | ICD-10-CM | POA: Diagnosis present

## 2020-03-12 DIAGNOSIS — Y831 Surgical operation with implant of artificial internal device as the cause of abnormal reaction of the patient, or of later complication, without mention of misadventure at the time of the procedure: Secondary | ICD-10-CM | POA: Diagnosis present

## 2020-03-12 DIAGNOSIS — I350 Nonrheumatic aortic (valve) stenosis: Secondary | ICD-10-CM

## 2020-03-12 DIAGNOSIS — Z79899 Other long term (current) drug therapy: Secondary | ICD-10-CM

## 2020-03-12 DIAGNOSIS — K5909 Other constipation: Secondary | ICD-10-CM | POA: Diagnosis present

## 2020-03-12 DIAGNOSIS — I5021 Acute systolic (congestive) heart failure: Secondary | ICD-10-CM | POA: Diagnosis present

## 2020-03-12 DIAGNOSIS — T82857A Stenosis of cardiac prosthetic devices, implants and grafts, initial encounter: Secondary | ICD-10-CM | POA: Diagnosis present

## 2020-03-12 DIAGNOSIS — T501X5A Adverse effect of loop [high-ceiling] diuretics, initial encounter: Secondary | ICD-10-CM | POA: Diagnosis not present

## 2020-03-12 DIAGNOSIS — I10 Essential (primary) hypertension: Secondary | ICD-10-CM | POA: Diagnosis present

## 2020-03-12 DIAGNOSIS — E538 Deficiency of other specified B group vitamins: Secondary | ICD-10-CM | POA: Diagnosis present

## 2020-03-12 DIAGNOSIS — I5041 Acute combined systolic (congestive) and diastolic (congestive) heart failure: Secondary | ICD-10-CM

## 2020-03-12 DIAGNOSIS — H8110 Benign paroxysmal vertigo, unspecified ear: Secondary | ICD-10-CM | POA: Diagnosis present

## 2020-03-12 DIAGNOSIS — I252 Old myocardial infarction: Secondary | ICD-10-CM

## 2020-03-12 DIAGNOSIS — Z952 Presence of prosthetic heart valve: Secondary | ICD-10-CM

## 2020-03-12 DIAGNOSIS — D529 Folate deficiency anemia, unspecified: Secondary | ICD-10-CM

## 2020-03-12 DIAGNOSIS — I050 Rheumatic mitral stenosis: Secondary | ICD-10-CM

## 2020-03-12 DIAGNOSIS — Z20822 Contact with and (suspected) exposure to covid-19: Secondary | ICD-10-CM | POA: Diagnosis present

## 2020-03-12 DIAGNOSIS — I509 Heart failure, unspecified: Secondary | ICD-10-CM | POA: Diagnosis not present

## 2020-03-12 DIAGNOSIS — E785 Hyperlipidemia, unspecified: Secondary | ICD-10-CM | POA: Diagnosis present

## 2020-03-12 DIAGNOSIS — Z7982 Long term (current) use of aspirin: Secondary | ICD-10-CM

## 2020-03-12 DIAGNOSIS — H9190 Unspecified hearing loss, unspecified ear: Secondary | ICD-10-CM | POA: Diagnosis present

## 2020-03-12 DIAGNOSIS — K5791 Diverticulosis of intestine, part unspecified, without perforation or abscess with bleeding: Secondary | ICD-10-CM | POA: Diagnosis present

## 2020-03-12 DIAGNOSIS — I471 Supraventricular tachycardia: Secondary | ICD-10-CM | POA: Diagnosis present

## 2020-03-12 DIAGNOSIS — N179 Acute kidney failure, unspecified: Secondary | ICD-10-CM | POA: Diagnosis present

## 2020-03-12 DIAGNOSIS — E876 Hypokalemia: Secondary | ICD-10-CM | POA: Diagnosis not present

## 2020-03-12 HISTORY — DX: Presence of prosthetic heart valve: Z95.2

## 2020-03-12 LAB — CBC WITH DIFFERENTIAL/PLATELET
Abs Immature Granulocytes: 0.06 10*3/uL (ref 0.00–0.07)
Basophils Absolute: 0 10*3/uL (ref 0.0–0.1)
Basophils Relative: 0 %
Eosinophils Absolute: 0 10*3/uL (ref 0.0–0.5)
Eosinophils Relative: 0 %
HCT: 25 % — ABNORMAL LOW (ref 36.0–46.0)
Hemoglobin: 7.5 g/dL — ABNORMAL LOW (ref 12.0–15.0)
Immature Granulocytes: 1 %
Lymphocytes Relative: 17 %
Lymphs Abs: 2 10*3/uL (ref 0.7–4.0)
MCH: 29.3 pg (ref 26.0–34.0)
MCHC: 30 g/dL (ref 30.0–36.0)
MCV: 97.7 fL (ref 80.0–100.0)
Monocytes Absolute: 1.3 10*3/uL — ABNORMAL HIGH (ref 0.1–1.0)
Monocytes Relative: 11 %
Neutro Abs: 8.5 10*3/uL — ABNORMAL HIGH (ref 1.7–7.7)
Neutrophils Relative %: 71 %
Platelets: 230 10*3/uL (ref 150–400)
RBC: 2.56 MIL/uL — ABNORMAL LOW (ref 3.87–5.11)
RDW: 16.3 % — ABNORMAL HIGH (ref 11.5–15.5)
WBC: 11.8 10*3/uL — ABNORMAL HIGH (ref 4.0–10.5)
nRBC: 0 % (ref 0.0–0.2)

## 2020-03-12 LAB — COMPREHENSIVE METABOLIC PANEL
ALT: 7 U/L (ref 0–44)
AST: 15 U/L (ref 15–41)
Albumin: 3 g/dL — ABNORMAL LOW (ref 3.5–5.0)
Alkaline Phosphatase: 57 U/L (ref 38–126)
Anion gap: 10 (ref 5–15)
BUN: 36 mg/dL — ABNORMAL HIGH (ref 8–23)
CO2: 24 mmol/L (ref 22–32)
Calcium: 8.5 mg/dL — ABNORMAL LOW (ref 8.9–10.3)
Chloride: 106 mmol/L (ref 98–111)
Creatinine, Ser: 1.31 mg/dL — ABNORMAL HIGH (ref 0.44–1.00)
GFR calc Af Amer: 41 mL/min — ABNORMAL LOW (ref >60)
GFR calc non Af Amer: 36 mL/min — ABNORMAL LOW (ref >60)
Glucose, Bld: 122 mg/dL — ABNORMAL HIGH (ref 70–99)
Potassium: 3.5 mmol/L (ref 3.5–5.1)
Sodium: 140 mmol/L (ref 135–145)
Total Bilirubin: 0.7 mg/dL (ref 0.3–1.2)
Total Protein: 6.6 g/dL (ref 6.5–8.1)

## 2020-03-12 LAB — TROPONIN I (HIGH SENSITIVITY): Troponin I (High Sensitivity): 176 ng/L (ref <18)

## 2020-03-12 LAB — LACTIC ACID, PLASMA: Lactic Acid, Venous: 1.2 mmol/L (ref 0.5–1.9)

## 2020-03-12 LAB — BRAIN NATRIURETIC PEPTIDE: B Natriuretic Peptide: 3112.4 pg/mL — ABNORMAL HIGH (ref 0.0–100.0)

## 2020-03-12 LAB — SARS CORONAVIRUS 2 BY RT PCR (HOSPITAL ORDER, PERFORMED IN ~~LOC~~ HOSPITAL LAB): SARS Coronavirus 2: NEGATIVE

## 2020-03-12 MED ORDER — ONDANSETRON HCL 4 MG/2ML IJ SOLN
4.0000 mg | Freq: Once | INTRAMUSCULAR | Status: AC
  Administered 2020-03-12: 4 mg via INTRAVENOUS
  Filled 2020-03-12: qty 2

## 2020-03-12 MED ORDER — FUROSEMIDE 10 MG/ML IJ SOLN
20.0000 mg | INTRAMUSCULAR | Status: AC
  Administered 2020-03-12: 20 mg via INTRAVENOUS
  Filled 2020-03-12: qty 2

## 2020-03-12 MED ORDER — LACTATED RINGERS IV BOLUS
1000.0000 mL | Freq: Once | INTRAVENOUS | Status: AC
  Administered 2020-03-12: 1000 mL via INTRAVENOUS

## 2020-03-12 MED ORDER — ACETAMINOPHEN 500 MG PO TABS
1000.0000 mg | ORAL_TABLET | Freq: Once | ORAL | Status: AC
  Administered 2020-03-12: 1000 mg via ORAL
  Filled 2020-03-12: qty 2

## 2020-03-12 MED ORDER — IOHEXOL 350 MG/ML SOLN
100.0000 mL | Freq: Once | INTRAVENOUS | Status: AC | PRN
  Administered 2020-03-12: 75 mL via INTRAVENOUS

## 2020-03-12 NOTE — ED Provider Notes (Signed)
[facility] EMERGENCY DEPARTMENT Provider Note   CSN: [id number] Arrival date & time: 03/12/20  1648     History Chief Complaint  Patient presents with  . Weakness    Morgan Holland is a 86 y.o. female.  86 year old female with past medical history below including hypertension, hypothyroidism, MI who presents with malaise.  She started feeling bad 4 days ago. She has had fatigue, increased sleepiness, generalized weakness, dizziness, nausea, decreased appetite, and mild shortness of breath. Mild cough, no vomiting/diarrhea, no fevers PTA. No CP, abdominal pain, or urinary symptoms. Pt has a great nephew who has had cold sx and is being tested for COVID. No medications tried PTA.    Weakness      Past Medical History:  Diagnosis Date  . Hypertension   . MI (myocardial infarction) (HCC)   . Thyroid disease     Patient Active Problem List   Diagnosis Date Noted  . Acute CHF (congestive heart failure) (HCC) 03/12/2020  . Pressure injury of skin 06/13/2019  . Pneumonia due to COVID-19 virus 06/12/2019  . Hypothyroidism 06/12/2019  . Hypertension   . Bradycardia     Past Surgical History:  Procedure Laterality Date  . AORTIC VALVE REPLACEMENT       OB History   No obstetric history on file.     No family history on file.  Social History   Tobacco Use  . Smoking status: Never Smoker  . Smokeless tobacco: Never Used  Substance Use Topics  . Alcohol use: Not Currently  . Drug use: Never    Home Medications Prior to Admission medications   Medication Sig Start Date End Date Taking? Authorizing Provider  acetaminophen (TYLENOL) 325 MG tablet Take 650 mg by mouth every 6 (six) hours as needed for headache (pain).    [provider]  aspirin EC 81 MG tablet Take 81 mg by mouth every morning.    [provider]  ferrous sulfate 325 (65 FE) MG EC tablet Take 1 tablet by mouth 2 (two) times daily. 05/10/19   [provider]    levothyroxine (SYNTHROID) 112 MCG tablet Take 1 tablet (112 mcg total) by mouth daily at 6 (six) AM. 06/17/19 07/17/19  Dorcas Carrow, MD  lisinopril (ZESTRIL) 10 MG tablet Take 10 mg by mouth every morning. 04/23/19   [provider]  meclizine (ANTIVERT) 12.5 MG tablet Take 1 tablet (12.5 mg total) by mouth 3 (three) times daily as needed for dizziness. 06/16/19   Dorcas Carrow, MD  metoprolol tartrate (LOPRESSOR) 50 MG tablet Take 1 tablet (50 mg total) by mouth 2 (two) times daily. 06/16/19 07/16/19  Dorcas Carrow, MD  omeprazole (PRILOSEC) 40 MG capsule Take 40 mg by mouth daily as needed (acid reflux/indigestion).  04/06/19   [provider]  triamcinolone cream (KENALOG) 0.1 % Apply 1 application topically See admin instructions. Apply topically to chin daily as needed for itching/rash 06/20/15   [provider]    Allergies    Patient has no known allergies.  Review of Systems   Review of Systems  Neurological: Positive for weakness.  All other systems reviewed and are negative except that which was mentioned in HPI   Physical Exam Updated Vital Signs BP 113/61   Pulse 79   Temp 98.1 F (36.7 C) (Oral)   Resp (!) 22   Ht 5\' 5"  (1.651 m)   Wt 59.4 kg   SpO2 92%   BMI 21.80 kg/m   Physical Exam  Vitals and nursing note reviewed.  Constitutional:      General: She is not in acute distress.    Appearance: She is well-developed.  HENT:     Head: Normocephalic and atraumatic.  Eyes:     Conjunctiva/sclera: Conjunctivae normal.  Cardiovascular:     Rate and Rhythm: Normal rate and regular rhythm.     Heart sounds: Murmur heard.   Pulmonary:     Effort: Pulmonary effort is normal.     Breath sounds: Normal breath sounds.  Abdominal:     General: Bowel sounds are normal. There is no distension.     Palpations: Abdomen is soft.     Tenderness: There is no abdominal tenderness.  Musculoskeletal:     Cervical back: Neck supple.     Right lower  leg: Edema present.     Left lower leg: Edema present.     Comments: Trace BLE edema  Skin:    General: Skin is warm and dry.  Neurological:     Mental Status: She is alert and oriented to person, place, and time.     Comments: Fluent speech  Psychiatric:        Judgment: Judgment normal.     ED Results / Procedures / Treatments   Labs (all labs ordered are listed, but only abnormal results are displayed) Labs Reviewed  COMPREHENSIVE METABOLIC PANEL - Abnormal; Notable for the following components:      Result Value   Glucose, Bld 122 (*)    BUN 36 (*)    Creatinine, Ser 1.31 (*)    Calcium 8.5 (*)    Albumin 3.0 (*)    GFR calc non Af Amer 36 (*)    GFR calc Af Amer 41 (*)    All other components within normal limits  CBC WITH DIFFERENTIAL/PLATELET - Abnormal; Notable for the following components:   WBC 11.8 (*)    RBC 2.56 (*)    Hemoglobin 7.5 (*)    HCT 25.0 (*)    RDW 16.3 (*)    Neutro Abs 8.5 (*)    Monocytes Absolute 1.3 (*)    All other components within normal limits  BRAIN NATRIURETIC PEPTIDE - Abnormal; Notable for the following components:   B Natriuretic Peptide 3,112.4 (*)    All other components within normal limits  TROPONIN I (HIGH SENSITIVITY) - Abnormal; Notable for the following components:   Troponin I (High Sensitivity) 176 (*)    All other components within normal limits  SARS CORONAVIRUS 2 BY RT PCR (HOSPITAL ORDER, PERFORMED IN [facility])  URINE CULTURE  CULTURE, BLOOD (ROUTINE X 2)  CULTURE, BLOOD (ROUTINE X 2)  LACTIC ACID, PLASMA  URINALYSIS, ROUTINE W REFLEX MICROSCOPIC    EKG EKG Interpretation  Date/Time:  Tuesday March 12 2020 18:06:58 EDT Ventricular Rate:  72 PR Interval:    QRS Duration: 87 QT Interval:  331 QTC Calculation: 363 R Axis:   67 Text Interpretation: Sinus rhythm Repol abnrm suggests ischemia, anterolateral similar to previous, rate faster Confirmed by Frederick Peers [phone]) on 03/12/2020  9:21:06 PM   Radiology CT Angio Chest PE W/Cm &/Or Wo Cm  Result Date: 03/12/2020 CLINICAL DATA:  Weakness dizziness shortness of breath EXAM: CT ANGIOGRAPHY CHEST WITH CONTRAST TECHNIQUE: Multidetector CT imaging of the chest was performed using the standard protocol during bolus administration of intravenous contrast. Multiplanar CT image reconstructions and MIPs were obtained to evaluate the vascular anatomy. CONTRAST:  50mL OMNIPAQUE IOHEXOL 350 MG/ML SOLN  COMPARISON:  None. FINDINGS: Cardiovascular: There is a optimal opacification of the pulmonary arteries. There is no central,segmental, or subsegmental filling defects within the pulmonary arteries. There is moderate cardiomegaly. Aortic valve and coronary artery calcifications are seen. Scattered aortic atherosclerosis is noted. No pericardial effusion or thickening. No evidence right heart strain. There is normal three-vessel brachiocephalic anatomy without proximal stenosis. The thoracic aorta is normal in appearance. Mediastinum/Nodes: No hilar, mediastinal, or axillary adenopathy. Thyroid gland, trachea, are unremarkable. There is a moderate hiatal hernia with reflux of fluid in the distal esophagus. Lungs/Pleura: Hazy ground-glass opacity seen at both lung bases with interlobular septal thickening. There is a small right and trace left pleural effusion present. No large airspace consolidation or pneumothorax. Upper Abdomen: No acute abnormalities present in the visualized portions of the upper abdomen. Musculoskeletal: No chest wall abnormality. No acute or significant osseous findings. Review of the MIP images confirms the above findings. IMPRESSION: No central, segmental, or subsegmental pulmonary embolism. Findings suggestive interstitial edema. Small right and trace left pleural effusion Aortic Atherosclerosis (ICD10-I70.0). Moderate hiatal hernia with gastroesophageal reflux Electronically Signed   By: Jonna Clark M.D.   On: 03/12/2020 21:02    DG Chest Port 1 View  Result Date: 03/12/2020 CLINICAL DATA:  Weakness and shortness of breath EXAM: PORTABLE CHEST 1 VIEW COMPARISON:  06/12/2019 FINDINGS: The Chin overlies the apices. Midline trachea. Reverse apical lordotic positioning. Mild cardiomegaly. Small left pleural effusion. No pneumothorax. Low lung volumes. Mild pulmonary interstitial prominence and indistinctness. Left greater than right base airspace disease. IMPRESSION: 1. Cardiomegaly with mild pulmonary venous congestion and small left pleural effusion. 2. Bibasilar airspace disease, atelectasis or infection. Electronically Signed   By: Jeronimo Greaves M.D.   On: 03/12/2020 17:56    Procedures Procedures (including critical care time)  Medications Ordered in ED Medications  lactated ringers bolus 1,000 mL ( Intravenous Stopped 03/12/20 1901)  acetaminophen (TYLENOL) tablet 1,000 mg (1,000 mg Oral Given 03/12/20 1902)  iohexol (OMNIPAQUE) 350 MG/ML injection 100 mL (75 mLs Intravenous Contrast Given 03/12/20 2040)  furosemide (LASIX) injection 20 mg (20 mg Intravenous Given 03/12/20 2224)  ondansetron (ZOFRAN) injection 4 mg (4 mg Intravenous Given 03/12/20 2221)    ED Course  I have reviewed the triage vital signs and the nursing notes.  Pertinent labs & imaging results that were available during my care of the patient were reviewed by me and considered in my medical decision making (see chart for details).    MDM Rules/Calculators/A&P                          Alert and comfortable on exam, hard of hearing but no acute distress.  Vital signs notable for temp of 100.4 but otherwise normal.  Lab work shows COVID-19 negative, mild AKI with creatinine 1.31, BUN 36, WBC 11.8, hemoglobin 7.5 which has dropped from previous around 10-11.  Patient states that she takes iron and her stool is always dark, has not noticed any acute melena or blood.  Chart review shows previous history of chronic blood loss with extensive negative GI  work-up, most recent GI recommendation was for no further colonoscopies.  Normal lactate, troponin I 76, BNP 3112.  Hest x-ray with cardiomegaly and interstitial edema.  CTA of chest shows no evidence of PE but does show interstitial edema.  Work-up is suggestive of congestive heart failure, no history according to family.  Gave IV Lasix and recommended admission for diuresis and further  evaluation.  Discussed admission with [facility], Dr. Toniann FailKakrakandy.  Final Clinical Impression(s) / ED Diagnoses Final diagnoses:  None    Rx / DC Orders ED Discharge Orders    None       Di Jasmer, Ambrose Finlandachel Morgan, MD 03/13/20 0023

## 2020-03-12 NOTE — ED Notes (Signed)
Patient transported to CT 

## 2020-03-12 NOTE — ED Triage Notes (Addendum)
Per pt and grand daughter pt with weakness, nausea,dizziness, SOB, decreased appetite, pale-sx started 8/27-pt with slow gait with own cane to triage-states she feels the same as when she had covid

## 2020-03-12 NOTE — ED Notes (Signed)
Granddaughter states she is leaving and wants to be called if pt gets a room and is moved tonight   Granddaughter took pt's purse home with her

## 2020-03-13 ENCOUNTER — Inpatient Hospital Stay (HOSPITAL_COMMUNITY): Payer: Medicare Other

## 2020-03-13 DIAGNOSIS — I248 Other forms of acute ischemic heart disease: Secondary | ICD-10-CM | POA: Diagnosis present

## 2020-03-13 DIAGNOSIS — I083 Combined rheumatic disorders of mitral, aortic and tricuspid valves: Secondary | ICD-10-CM | POA: Diagnosis present

## 2020-03-13 DIAGNOSIS — Y831 Surgical operation with implant of artificial internal device as the cause of abnormal reaction of the patient, or of later complication, without mention of misadventure at the time of the procedure: Secondary | ICD-10-CM | POA: Diagnosis present

## 2020-03-13 DIAGNOSIS — K922 Gastrointestinal hemorrhage, unspecified: Secondary | ICD-10-CM | POA: Diagnosis present

## 2020-03-13 DIAGNOSIS — K5791 Diverticulosis of intestine, part unspecified, without perforation or abscess with bleeding: Secondary | ICD-10-CM | POA: Diagnosis present

## 2020-03-13 DIAGNOSIS — K5909 Other constipation: Secondary | ICD-10-CM | POA: Diagnosis present

## 2020-03-13 DIAGNOSIS — D500 Iron deficiency anemia secondary to blood loss (chronic): Secondary | ICD-10-CM | POA: Diagnosis present

## 2020-03-13 DIAGNOSIS — I5031 Acute diastolic (congestive) heart failure: Secondary | ICD-10-CM | POA: Diagnosis not present

## 2020-03-13 DIAGNOSIS — Z952 Presence of prosthetic heart valve: Secondary | ICD-10-CM | POA: Diagnosis not present

## 2020-03-13 DIAGNOSIS — Z79899 Other long term (current) drug therapy: Secondary | ICD-10-CM | POA: Diagnosis not present

## 2020-03-13 DIAGNOSIS — I252 Old myocardial infarction: Secondary | ICD-10-CM | POA: Diagnosis not present

## 2020-03-13 DIAGNOSIS — E039 Hypothyroidism, unspecified: Secondary | ICD-10-CM | POA: Diagnosis present

## 2020-03-13 DIAGNOSIS — I471 Supraventricular tachycardia: Secondary | ICD-10-CM | POA: Diagnosis present

## 2020-03-13 DIAGNOSIS — T501X5A Adverse effect of loop [high-ceiling] diuretics, initial encounter: Secondary | ICD-10-CM | POA: Diagnosis not present

## 2020-03-13 DIAGNOSIS — I10 Essential (primary) hypertension: Secondary | ICD-10-CM | POA: Diagnosis not present

## 2020-03-13 DIAGNOSIS — E876 Hypokalemia: Secondary | ICD-10-CM | POA: Diagnosis not present

## 2020-03-13 DIAGNOSIS — N179 Acute kidney failure, unspecified: Secondary | ICD-10-CM | POA: Diagnosis present

## 2020-03-13 DIAGNOSIS — I110 Hypertensive heart disease with heart failure: Secondary | ICD-10-CM | POA: Diagnosis present

## 2020-03-13 DIAGNOSIS — Z20822 Contact with and (suspected) exposure to covid-19: Secondary | ICD-10-CM | POA: Diagnosis present

## 2020-03-13 DIAGNOSIS — I350 Nonrheumatic aortic (valve) stenosis: Secondary | ICD-10-CM | POA: Diagnosis not present

## 2020-03-13 DIAGNOSIS — K219 Gastro-esophageal reflux disease without esophagitis: Secondary | ICD-10-CM | POA: Diagnosis present

## 2020-03-13 DIAGNOSIS — H8110 Benign paroxysmal vertigo, unspecified ear: Secondary | ICD-10-CM | POA: Diagnosis present

## 2020-03-13 DIAGNOSIS — Z8616 Personal history of COVID-19: Secondary | ICD-10-CM | POA: Diagnosis not present

## 2020-03-13 DIAGNOSIS — I509 Heart failure, unspecified: Secondary | ICD-10-CM | POA: Diagnosis present

## 2020-03-13 DIAGNOSIS — Z7982 Long term (current) use of aspirin: Secondary | ICD-10-CM | POA: Diagnosis not present

## 2020-03-13 DIAGNOSIS — E538 Deficiency of other specified B group vitamins: Secondary | ICD-10-CM | POA: Diagnosis present

## 2020-03-13 DIAGNOSIS — E785 Hyperlipidemia, unspecified: Secondary | ICD-10-CM | POA: Diagnosis present

## 2020-03-13 DIAGNOSIS — T82857A Stenosis of cardiac prosthetic devices, implants and grafts, initial encounter: Secondary | ICD-10-CM | POA: Diagnosis present

## 2020-03-13 DIAGNOSIS — H9190 Unspecified hearing loss, unspecified ear: Secondary | ICD-10-CM | POA: Diagnosis present

## 2020-03-13 DIAGNOSIS — I5041 Acute combined systolic (congestive) and diastolic (congestive) heart failure: Secondary | ICD-10-CM | POA: Diagnosis not present

## 2020-03-13 DIAGNOSIS — I050 Rheumatic mitral stenosis: Secondary | ICD-10-CM | POA: Diagnosis not present

## 2020-03-13 DIAGNOSIS — I5021 Acute systolic (congestive) heart failure: Secondary | ICD-10-CM | POA: Diagnosis present

## 2020-03-13 DIAGNOSIS — Z79890 Hormone replacement therapy (postmenopausal): Secondary | ICD-10-CM | POA: Diagnosis not present

## 2020-03-13 LAB — IRON AND TIBC
Iron: 13 ug/dL — ABNORMAL LOW (ref 28–170)
Saturation Ratios: 4 % — ABNORMAL LOW (ref 10.4–31.8)
TIBC: 298 ug/dL (ref 250–450)
UIBC: 285 ug/dL

## 2020-03-13 LAB — ECHOCARDIOGRAM COMPLETE
AR max vel: 0.48 cm2
AV Area VTI: 0.52 cm2
AV Area mean vel: 0.48 cm2
AV Mean grad: 50.5 mmHg
AV Peak grad: 76.7 mmHg
Ao pk vel: 4.38 m/s
Area-P 1/2: 4.21 cm2
Height: 62 in
MV M vel: 6.31 m/s
MV Peak grad: 159.3 mmHg
P 1/2 time: 304 msec
S' Lateral: 4.1 cm
Weight: 2115.2 oz

## 2020-03-13 LAB — URINALYSIS, ROUTINE W REFLEX MICROSCOPIC
Bilirubin Urine: NEGATIVE
Glucose, UA: NEGATIVE mg/dL
Hgb urine dipstick: NEGATIVE
Ketones, ur: NEGATIVE mg/dL
Nitrite: NEGATIVE
Protein, ur: NEGATIVE mg/dL
Specific Gravity, Urine: 1.01 (ref 1.005–1.030)
pH: 5 (ref 5.0–8.0)

## 2020-03-13 LAB — URINALYSIS, MICROSCOPIC (REFLEX): RBC / HPF: NONE SEEN RBC/hpf (ref 0–5)

## 2020-03-13 LAB — CBC
HCT: 23.6 % — ABNORMAL LOW (ref 36.0–46.0)
Hemoglobin: 7.1 g/dL — ABNORMAL LOW (ref 12.0–15.0)
MCH: 29.6 pg (ref 26.0–34.0)
MCHC: 30.1 g/dL (ref 30.0–36.0)
MCV: 98.3 fL (ref 80.0–100.0)
Platelets: 214 10*3/uL (ref 150–400)
RBC: 2.4 MIL/uL — ABNORMAL LOW (ref 3.87–5.11)
RDW: 15.9 % — ABNORMAL HIGH (ref 11.5–15.5)
WBC: 9.4 10*3/uL (ref 4.0–10.5)
nRBC: 0 % (ref 0.0–0.2)

## 2020-03-13 LAB — TROPONIN I (HIGH SENSITIVITY)
Troponin I (High Sensitivity): 169 ng/L (ref <18)
Troponin I (High Sensitivity): 182 ng/L (ref <18)

## 2020-03-13 LAB — RETICULOCYTES
Immature Retic Fract: 28.1 % — ABNORMAL HIGH (ref 2.3–15.9)
RBC.: 2.55 MIL/uL — ABNORMAL LOW (ref 3.87–5.11)
Retic Count, Absolute: 151 10*3/uL (ref 19.0–186.0)
Retic Ct Pct: 5.9 % — ABNORMAL HIGH (ref 0.4–3.1)

## 2020-03-13 LAB — URINE CULTURE

## 2020-03-13 LAB — FERRITIN: Ferritin: 20 ng/mL (ref 11–307)

## 2020-03-13 LAB — TSH: TSH: 0.557 u[IU]/mL (ref 0.350–4.500)

## 2020-03-13 LAB — VITAMIN B12: Vitamin B-12: 242 pg/mL (ref 180–914)

## 2020-03-13 LAB — FOLATE: Folate: 4.2 ng/mL — ABNORMAL LOW (ref >5.9)

## 2020-03-13 MED ORDER — FUROSEMIDE 10 MG/ML IJ SOLN
20.0000 mg | Freq: Once | INTRAMUSCULAR | Status: AC
  Administered 2020-03-13: 20 mg via INTRAVENOUS
  Filled 2020-03-13: qty 2

## 2020-03-13 MED ORDER — ONDANSETRON HCL 4 MG/2ML IJ SOLN
4.0000 mg | Freq: Four times a day (QID) | INTRAMUSCULAR | Status: DC | PRN
  Administered 2020-03-13 – 2020-03-14 (×4): 4 mg via INTRAVENOUS
  Filled 2020-03-13 (×4): qty 2

## 2020-03-13 MED ORDER — SODIUM CHLORIDE 0.9 % IV SOLN: 250.0000 mL | INTRAVENOUS | Status: DC | PRN

## 2020-03-13 MED ORDER — FUROSEMIDE 10 MG/ML IJ SOLN
40.0000 mg | Freq: Every day | INTRAMUSCULAR | Status: DC
  Administered 2020-03-13 – 2020-03-14 (×3): 40 mg via INTRAVENOUS
  Filled 2020-03-13 (×5): qty 4

## 2020-03-13 MED ORDER — ACETAMINOPHEN 325 MG PO TABS: 650.0000 mg | ORAL_TABLET | ORAL | Status: DC | PRN

## 2020-03-13 MED ORDER — SODIUM CHLORIDE 0.9% FLUSH
3.0000 mL | Freq: Two times a day (BID) | INTRAVENOUS | Status: DC
  Administered 2020-03-13 – 2020-03-15 (×6): 3 mL via INTRAVENOUS

## 2020-03-13 MED ORDER — SODIUM CHLORIDE 0.9% FLUSH: 3.0000 mL | INTRAVENOUS | Status: DC | PRN

## 2020-03-13 NOTE — Progress Notes (Signed)
Pt Troponin now is 169, lower than last night. Paged MD. Pt denies chest pain. Will monitor

## 2020-03-13 NOTE — Progress Notes (Signed)
SATURATION QUALIFICATIONS: (This note is used to comply with regulatory documentation for home oxygen)  Patient Saturations on Room Air at Rest =100  Patient Saturations on Room Air while Ambulating =97%  Patient Saturations on 0 Liters of oxygen while Ambulating = 0%  Please briefly explain why patient needs home oxygen:patient does not require oxygen she did fine ambulating up and down the hallway with the rollator full length of hallway and back to her room gait was steady required no assist from writer o2 sats dropped the lowest to 94% she stopped for a brief minute and recooperated o2 sats when back to 97%. NO OXYGEN Needed for home!

## 2020-03-13 NOTE — H&P (Signed)
History and Physical    Morgan Sorensonlizbeth Rubinstein ZOX:[id number] DOB: 12/03/1926 DOA: 03/12/2020  PCP: Ardyth GalYbanez, [name], MD  Patient coming from: Home  I have personally briefly reviewed [facility]  Chief Complaint: Generalized weakness  HPI: Morgan Holland is a 86 y.o. female with medical history significant of HTN, hypothyroidism, MI.  Chronic GI blood loss being treated with PO iron, no further colonoscopies recommended at this stage.  Pt presents to ED with 4 day history of fatigue, generalized weakness, nausea, dizziness, and mild SOB.  No fevers, no N/V/D, no CP, no abd pain, no dyuria.   ED Course: work up in ED is suspicious for CHF.  HGB 7.5  COVID neg  CTA chest is neg for PE but shows pulm edema.  Pt given 1L LR bolus then 20mg  lasix IV in ED.   Review of Systems: As per HPI, otherwise all review of systems negative.  Past Medical History:  Diagnosis Date  . Hypertension   . MI (myocardial infarction) (HCC)   . Thyroid disease     Past Surgical History:  Procedure Laterality Date  . AORTIC VALVE REPLACEMENT       reports that she has never smoked. She has never used smokeless tobacco. She reports previous alcohol use. She reports that she does not use drugs.  No Known Allergies  No family history on file.   Prior to Admission medications   Medication Sig Start Date End Date Taking? Authorizing Provider  acetaminophen (TYLENOL) 325 MG tablet Take 650 mg by mouth every 6 (six) hours as needed for headache (pain).    [provider]  aspirin EC 81 MG tablet Take 81 mg by mouth every morning.    [provider]  ferrous sulfate 325 (65 FE) MG EC tablet Take 1 tablet by mouth 2 (two) times daily. 05/10/19   [provider]  levothyroxine (SYNTHROID) 112 MCG tablet Take 1 tablet (112 mcg total) by mouth daily at 6 (six) AM. 06/17/19 07/17/19  Dorcas CarrowGhimire, Kuber, MD  lisinopril (ZESTRIL) 10 MG tablet Take 10 mg by  mouth every morning. 04/23/19   [provider]  meclizine (ANTIVERT) 12.5 MG tablet Take 1 tablet (12.5 mg total) by mouth 3 (three) times daily as needed for dizziness. 06/16/19   Dorcas CarrowGhimire, Kuber, MD  metoprolol tartrate (LOPRESSOR) 50 MG tablet Take 1 tablet (50 mg total) by mouth 2 (two) times daily. 06/16/19 07/16/19  Dorcas CarrowGhimire, Kuber, MD  omeprazole (PRILOSEC) 40 MG capsule Take 40 mg by mouth daily as needed (acid reflux/indigestion).  04/06/19   [provider]  triamcinolone cream (KENALOG) 0.1 % Apply 1 application topically See admin instructions. Apply topically to chin daily as needed for itching/rash 06/20/15   [provider]    Physical Exam: Vitals:   03/12/20 2330 03/13/20 0000 03/13/20 0053 03/13/20 0100  BP: 109/67 113/61 109/71   Pulse: 76 79 86   Resp: (!) 22 (!) 22 16   Temp: 98.1 F (36.7 C)  98.7 F (37.1 C)   TempSrc: Oral  Oral   SpO2: 94% 92% 100%   Weight:    60 kg  Height:    5\' 2"  (1.575 m)    Constitutional: NAD, calm, comfortable Eyes: PERRL, lids and conjunctivae normal ENMT: Mucous membranes are moist. Posterior pharynx clear of any exudate or lesions.Normal dentition.  Neck: normal, supple, no masses, no thyromegaly Respiratory: clear to auscultation bilaterally, no wheezing, no crackles. Normal respiratory effort. No accessory  muscle use.  Cardiovascular: Regular rate and rhythm, no murmurs / rubs / gallops. Trace BLE edema. 2+ pedal pulses. No carotid bruits.  Abdomen: no tenderness, no masses palpated. No hepatosplenomegaly. Bowel sounds positive.  Musculoskeletal: no clubbing / cyanosis. No joint deformity upper and lower extremities. Good ROM, no contractures. Normal muscle tone.  Skin: no rashes, lesions, ulcers. No induration Neurologic: CN 2-12 grossly intact. Sensation intact, DTR normal. Strength 5/5 in all 4.  Psychiatric: Normal judgment and insight. Alert and oriented x 3. Normal mood.    Labs on Admission: I have  personally reviewed following labs and imaging studies  CBC: Recent Labs  Lab 03/12/20 1749  WBC 11.8*  NEUTROABS 8.5*  HGB 7.5*  HCT 25.0*  MCV 97.7  PLT 230   Basic Metabolic Panel: Recent Labs  Lab 03/12/20 1749  NA 140  K 3.5  CL 106  CO2 24  GLUCOSE 122*  BUN 36*  CREATININE 1.31*  CALCIUM 8.5*   GFR: Estimated Creatinine Clearance: 22.1 mL/min (A) (by C-G formula based on SCr of 1.31 mg/dL (H)). Liver Function Tests: Recent Labs  Lab 03/12/20 1749  AST 15  ALT 7  ALKPHOS 57  BILITOT 0.7  PROT 6.6  ALBUMIN 3.0*   No results for input(s): LIPASE, AMYLASE in the last 168 hours. No results for input(s): AMMONIA in the last 168 hours. Coagulation Profile: No results for input(s): INR, PROTIME in the last 168 hours. Cardiac Enzymes: No results for input(s): CKTOTAL, CKMB, CKMBINDEX, TROPONINI in the last 168 hours. BNP (last 3 results) No results for input(s): PROBNP in the last 8760 hours. HbA1C: No results for input(s): HGBA1C in the last 72 hours. CBG: No results for input(s): GLUCAP in the last 168 hours. Lipid Profile: No results for input(s): CHOL, HDL, LDLCALC, TRIG, CHOLHDL, LDLDIRECT in the last 72 hours. Thyroid Function Tests: No results for input(s): TSH, T4TOTAL, FREET4, T3FREE, THYROIDAB in the last 72 hours. Anemia Panel: No results for input(s): VITAMINB12, FOLATE, FERRITIN, TIBC, IRON, RETICCTPCT in the last 72 hours. Urine analysis:    Component Value Date/Time   COLORURINE YELLOW 03/13/2020 0001   APPEARANCEUR CLEAR 03/13/2020 0001   LABSPEC 1.010 03/13/2020 0001   PHURINE 5.0 03/13/2020 0001   GLUCOSEU NEGATIVE 03/13/2020 0001   HGBUR NEGATIVE 03/13/2020 0001   BILIRUBINUR NEGATIVE 03/13/2020 0001   KETONESUR NEGATIVE 03/13/2020 0001   PROTEINUR NEGATIVE 03/13/2020 0001   NITRITE NEGATIVE 03/13/2020 0001   LEUKOCYTESUR SMALL (A) 03/13/2020 0001    Radiological Exams on Admission: CT Angio Chest PE W/Cm &/Or Wo Cm  Result  Date: 03/12/2020 CLINICAL DATA:  Weakness dizziness shortness of breath EXAM: CT ANGIOGRAPHY CHEST WITH CONTRAST TECHNIQUE: Multidetector CT imaging of the chest was performed using the standard protocol during bolus administration of intravenous contrast. Multiplanar CT image reconstructions and MIPs were obtained to evaluate the vascular anatomy. CONTRAST:  49mL OMNIPAQUE IOHEXOL 350 MG/ML SOLN COMPARISON:  None. FINDINGS: Cardiovascular: There is a optimal opacification of the pulmonary arteries. There is no central,segmental, or subsegmental filling defects within the pulmonary arteries. There is moderate cardiomegaly. Aortic valve and coronary artery calcifications are seen. Scattered aortic atherosclerosis is noted. No pericardial effusion or thickening. No evidence right heart strain. There is normal three-vessel brachiocephalic anatomy without proximal stenosis. The thoracic aorta is normal in appearance. Mediastinum/Nodes: No hilar, mediastinal, or axillary adenopathy. Thyroid gland, trachea, are unremarkable. There is a moderate hiatal hernia with reflux of fluid in the distal esophagus. Lungs/Pleura: Hazy ground-glass opacity seen  at both lung bases with interlobular septal thickening. There is a small right and trace left pleural effusion present. No large airspace consolidation or pneumothorax. Upper Abdomen: No acute abnormalities present in the visualized portions of the upper abdomen. Musculoskeletal: No chest wall abnormality. No acute or significant osseous findings. Review of the MIP images confirms the above findings. IMPRESSION: No central, segmental, or subsegmental pulmonary embolism. Findings suggestive interstitial edema. Small right and trace left pleural effusion Aortic Atherosclerosis (ICD10-I70.0). Moderate hiatal hernia with gastroesophageal reflux Electronically Signed   By: Jonna Clark M.D.   On: 03/12/2020 21:02   DG Chest Port 1 View  Result Date: 03/12/2020 CLINICAL DATA:   Weakness and shortness of breath EXAM: PORTABLE CHEST 1 VIEW COMPARISON:  06/12/2019 FINDINGS: The Chin overlies the apices. Midline trachea. Reverse apical lordotic positioning. Mild cardiomegaly. Small left pleural effusion. No pneumothorax. Low lung volumes. Mild pulmonary interstitial prominence and indistinctness. Left greater than right base airspace disease. IMPRESSION: 1. Cardiomegaly with mild pulmonary venous congestion and small left pleural effusion. 2. Bibasilar airspace disease, atelectasis or infection. Electronically Signed   By: Jeronimo Greaves M.D.   On: 03/12/2020 17:56    EKG: Independently reviewed.  Assessment/Plan Principal Problem:   New onset of congestive heart failure (HCC) Active Problems:   Hypertension   Chronic blood loss anemia   Chronic GI bleeding    1. New onset of CHF - unknown if systolic or diastolic at this point 1. CHF pathway 2. 2d echo 3. Lasix 20mg  IV now x1 then 40mg  IV daily 4. Strict intake and output 5. BMP daily to monitor renal function with diuresis 2. HTN - 1. Lasix as above 2. Med rec pending 3. Will hold lisinopril due to creat 1.3 and diuresis 4. Give metoprolol 3. Chronic blood loss anemia - 1. Cont PO iron 2. Repeat CBC in AM 3. Type and screen 4. May end up needing transfusion but want to diurese first. 4. Hypothyroidism - 1. Check TSH - was very elevated back in Nov last year... 2. Plan to continue synthroid  DVT prophylaxis: SCDs Code Status: Full Family Communication: No family in room Disposition Plan: Home after CHF improved and worked up called: None Admission status: Admit to inpatient  Severity of Illness: The appropriate patient status for this patient is INPATIENT. Inpatient status is judged to be reasonable and necessary in order to provide the required intensity of service to ensure the patient's safety. The patient's presenting symptoms, physical exam findings, and initial radiographic and laboratory  data in the context of their chronic comorbidities is felt to place them at high risk for further clinical deterioration. Furthermore, it is not anticipated that the patient will be medically stable for discharge from the hospital within 2 midnights of admission. The following factors support the patient status of inpatient.   IP status for new onset of CHF.   * I certify that at the point of admission it is my clinical judgment that the patient will require inpatient hospital care spanning beyond 2 midnights from the point of admission due to high intensity of service, high risk for further deterioration and high frequency of surveillance required.*    Othman Masur M. DO [facility]  How to contact the [facility] Attending or Consulting provider 7A - 7P or covering provider during after hours 7P -7A, for this patient?  1. Check the care team in [facility] and look for a) attending/consulting TRH provider listed and b) the [facility] team listed 2.  Log into [email or web address]  Amion Physician Scheduling and messaging for groups and whole hospitals  On call and physician scheduling software for group practices, residents, hospitalists and other medical providers for call, clinic, rotation and shift schedules. On[employer] is a hospital-wide system for scheduling doctors and paging doctors on call. EasyPlot is for scientific plotting and data analysis.  [email or web address]  and use [employer]'s universal password to access. If you do not have the password, please contact the hospital operator.  3. Locate the [facility] provider you are looking for under [facility] and page to a number that you can be directly reached. 4. If you still have difficulty reaching the provider, please page the [facility] (Director on Call) for the Hospitalists listed on amion for assistance.  03/13/2020, 2:20 AM

## 2020-03-13 NOTE — TOC Transition Note (Signed)
Transition of Care [facility]) - CM/SW Discharge Note   Patient Details  Name: Morgan Holland MRN: [id number] Date of Birth: [birth year]  [facility]) CM/SW Contact:  Leone Haven, RN Phone Number: 03/13/2020, 5:02 PM   Clinical Narrative:    NCM spoke with patient at bedside, she gave NCM permission to call her daughter.  NCM called her daughter [name], offered choice for [facility], for CHF , she states she does not have a preference , NCM made referral to Tiffany with [facility] for [facility] for CHF, she is able to take referral.  Soc will begin 24 to 48 hrs post dc.   Final next level of care: Home w Home Health Services Barriers to Discharge: Continued Medical Work up   Patient Goals and CMS Choice Patient states their goals for this hospitalization and ongoing recovery are:: get better CMS Medicare.gov Compare Post Acute Care list provided to:: Patient Represenative (must comment) Choice offered to / list presented to : Adult Children  Discharge Placement                       Discharge Plan and Services                  DME Agency: NA       HH Arranged: RN HH Agency: [facility] Date HH Agency Contacted: 03/13/20 Time HH Agency Contacted: 1702 Representative spoke with at [facility] Agency: Tiffany  Social Determinants of Health (SDOH) Interventions     Readmission Risk Interventions No flowsheet data found.

## 2020-03-13 NOTE — TOC Progression Note (Signed)
Transition of Care [facility]) - Progression Note    Patient Details  Name: Morgan Holland MRN: [id number] Date of Birth: [birth year]  Transition of Care [facility]) CM/SW Contact  Leone Haven, RN Phone Number: 03/13/2020, 4:01 PM  Clinical Narrative:    NCM spoke with patient at bedside, she gave NCM permission to call her daughter.  NCM called her daughter [name], offered choice for [facility], for CHF , she states she does not have a preference of which agency.         Expected Discharge Plan and Services                                                 Social Determinants of Health (SDOH) Interventions    Readmission Risk Interventions No flowsheet data found.

## 2020-03-13 NOTE — Progress Notes (Addendum)
[facility]  Patient was admitted earlier this morning by my colleague.  I have reviewed admission data and examined the patient. This is a 86 year old female with hypertension hypothyroidism and a history of MI.  She also has a history of chronic GI bleeding and is on oral iron. The patient scented with shortness of breath and dizziness.  Imaging was suggestive of congestive heart failure.  BNP 3112  She was admitted for congestive heart failure exacerbation and started on IV Lasix.     Principal Problem:   New onset of congestive heart failure -Shortness of breath, CT of the chest revealed interstitial edema, BNP is about 3000 -She remains short of breath on exertion - COVID is negative - Continue IV Lasix while waiting on echo report -Follow I&O and daily weights  Active Problems:     Chronic blood loss anemia   Chronic GI bleeding - Patient's hemoglobin is 7.1 today. -Her last hemoglobin in December 2020 was 10.5-I will check an anemia panel   Calvert Cantor, MD

## 2020-03-13 NOTE — Progress Notes (Signed)
  Echocardiogram 2D Echocardiogram has been performed.  Morgan Holland 03/13/2020, 3:08 PM

## 2020-03-14 DIAGNOSIS — I350 Nonrheumatic aortic (valve) stenosis: Secondary | ICD-10-CM

## 2020-03-14 DIAGNOSIS — D500 Iron deficiency anemia secondary to blood loss (chronic): Secondary | ICD-10-CM

## 2020-03-14 DIAGNOSIS — K922 Gastrointestinal hemorrhage, unspecified: Secondary | ICD-10-CM

## 2020-03-14 DIAGNOSIS — Z952 Presence of prosthetic heart valve: Secondary | ICD-10-CM

## 2020-03-14 DIAGNOSIS — D529 Folate deficiency anemia, unspecified: Secondary | ICD-10-CM

## 2020-03-14 DIAGNOSIS — E039 Hypothyroidism, unspecified: Secondary | ICD-10-CM

## 2020-03-14 DIAGNOSIS — I050 Rheumatic mitral stenosis: Secondary | ICD-10-CM

## 2020-03-14 DIAGNOSIS — D519 Vitamin B12 deficiency anemia, unspecified: Secondary | ICD-10-CM

## 2020-03-14 DIAGNOSIS — I10 Essential (primary) hypertension: Secondary | ICD-10-CM

## 2020-03-14 DIAGNOSIS — I509 Heart failure, unspecified: Secondary | ICD-10-CM

## 2020-03-14 DIAGNOSIS — I5041 Acute combined systolic (congestive) and diastolic (congestive) heart failure: Secondary | ICD-10-CM

## 2020-03-14 LAB — BASIC METABOLIC PANEL
Anion gap: 12 (ref 5–15)
BUN: 28 mg/dL — ABNORMAL HIGH (ref 8–23)
CO2: 26 mmol/L (ref 22–32)
Calcium: 8.7 mg/dL — ABNORMAL LOW (ref 8.9–10.3)
Chloride: 104 mmol/L (ref 98–111)
Creatinine, Ser: 1.28 mg/dL — ABNORMAL HIGH (ref 0.44–1.00)
GFR calc Af Amer: 42 mL/min — ABNORMAL LOW (ref >60)
GFR calc non Af Amer: 37 mL/min — ABNORMAL LOW (ref >60)
Glucose, Bld: 112 mg/dL — ABNORMAL HIGH (ref 70–99)
Potassium: 3.3 mmol/L — ABNORMAL LOW (ref 3.5–5.1)
Sodium: 142 mmol/L (ref 135–145)

## 2020-03-14 LAB — HEMOGLOBIN A1C
Hgb A1c MFr Bld: 4.8 % (ref 4.8–5.6)
Mean Plasma Glucose: 91.06 mg/dL

## 2020-03-14 LAB — PREPARE RBC (CROSSMATCH)

## 2020-03-14 MED ORDER — FUROSEMIDE 10 MG/ML IJ SOLN
20.0000 mg | Freq: Once | INTRAMUSCULAR | Status: AC
  Administered 2020-03-14: 20 mg via INTRAVENOUS
  Filled 2020-03-14: qty 2

## 2020-03-14 MED ORDER — MECLIZINE HCL 25 MG PO TABS
12.5000 mg | ORAL_TABLET | Freq: Three times a day (TID) | ORAL | Status: DC
  Administered 2020-03-14 – 2020-03-16 (×8): 12.5 mg via ORAL
  Filled 2020-03-14 (×8): qty 1

## 2020-03-14 MED ORDER — MECLIZINE HCL 25 MG PO TABS
12.5000 mg | ORAL_TABLET | Freq: Three times a day (TID) | ORAL | Status: DC | PRN
  Filled 2020-03-14: qty 1

## 2020-03-14 MED ORDER — PANTOPRAZOLE SODIUM 40 MG PO TBEC
40.0000 mg | DELAYED_RELEASE_TABLET | Freq: Two times a day (BID) | ORAL | Status: DC
  Administered 2020-03-14 – 2020-03-16 (×4): 40 mg via ORAL
  Filled 2020-03-14 (×5): qty 1

## 2020-03-14 MED ORDER — LEVOTHYROXINE SODIUM 100 MCG PO TABS
100.0000 ug | ORAL_TABLET | Freq: Every morning | ORAL | Status: DC
  Administered 2020-03-14 – 2020-03-16 (×3): 100 ug via ORAL
  Filled 2020-03-14 (×3): qty 1

## 2020-03-14 MED ORDER — FOLIC ACID 1 MG PO TABS
1.0000 mg | ORAL_TABLET | Freq: Two times a day (BID) | ORAL | Status: DC
  Administered 2020-03-14 – 2020-03-16 (×5): 1 mg via ORAL
  Filled 2020-03-14 (×5): qty 1

## 2020-03-14 MED ORDER — CYANOCOBALAMIN 1000 MCG/ML IJ SOLN
1000.0000 ug | Freq: Every day | INTRAMUSCULAR | Status: AC
  Administered 2020-03-14 – 2020-03-15 (×2): 1000 ug via SUBCUTANEOUS
  Filled 2020-03-14 (×3): qty 1

## 2020-03-14 MED ORDER — MECLIZINE HCL 25 MG PO TABS: 12.5000 mg | ORAL_TABLET | Freq: Three times a day (TID) | ORAL | Status: DC

## 2020-03-14 MED ORDER — SODIUM CHLORIDE 0.9% IV SOLUTION: Freq: Once | INTRAVENOUS | Status: AC

## 2020-03-14 MED ORDER — PANTOPRAZOLE SODIUM 40 MG PO TBEC
40.0000 mg | DELAYED_RELEASE_TABLET | Freq: Every day | ORAL | Status: DC
Start: 2020-03-14 — End: 2020-03-14

## 2020-03-14 MED ORDER — METOPROLOL TARTRATE 12.5 MG HALF TABLET
12.5000 mg | ORAL_TABLET | Freq: Two times a day (BID) | ORAL | Status: DC
  Administered 2020-03-14 – 2020-03-15 (×3): 12.5 mg via ORAL
  Filled 2020-03-14 (×4): qty 1

## 2020-03-14 MED ORDER — ALUM & MAG HYDROXIDE-SIMETH 200-200-20 MG/5ML PO SUSP
15.0000 mL | Freq: Four times a day (QID) | ORAL | Status: DC | PRN
  Filled 2020-03-14: qty 30

## 2020-03-14 MED ORDER — POTASSIUM CHLORIDE CRYS ER 20 MEQ PO TBCR
40.0000 meq | EXTENDED_RELEASE_TABLET | Freq: Two times a day (BID) | ORAL | Status: AC
  Administered 2020-03-14 – 2020-03-15 (×3): 40 meq via ORAL
  Filled 2020-03-14 (×3): qty 2

## 2020-03-14 MED ORDER — SODIUM CHLORIDE 0.9 % IV SOLN
510.0000 mg | Freq: Once | INTRAVENOUS | Status: AC
  Administered 2020-03-14: 510 mg via INTRAVENOUS
  Filled 2020-03-14 (×2): qty 17

## 2020-03-14 NOTE — Progress Notes (Signed)
MD, pt's grand daughter wanted to be called  of the pt's status and treatment plan, [name] and her  # is [phone], Thanks Lavonda Jumbo RN.

## 2020-03-14 NOTE — Progress Notes (Addendum)
PROGRESS NOTE    Morgan Holland   EVO:[id number]  DOB: 24-Aug-1926  DOA: 03/12/2020 PCP: Ardyth Gal, MD   Brief Narrative:  Morgan Holland is a 86 year old female with a history of hypertension, hypothyroidism, prosthetic aortic valve and gastroesophageal reflux disease with chronic GI bleeding and iron deficiency anemia.  She has been "feeling bad" for about 4 days with complaints of fatigue increased sleeping, generalized weakness, dizziness, nausea and decrease in appetite.  This was associated with shortness of breath as well.  In the ED: Found to have congestive heart failure with crackles on exam, elevated BNP and CT scan suggestive of pulmonary edema.  Started on Lasix. She was also found to have a hemoglobin of 7.5.   Subjective: She woke up last night with shortness of breath, nausea and heart palpitations.  Today she feels nauseated.  She continues to feel weak and has difficulty with ambulating.    Assessment & Plan:   Principal Problem:   New onset of systolic congestive heart failure-severe aortic stenosis and moderate mitral regurgitation - h/o MI in the past  - she states she had an MI about 4-5 yrs ago and received a pig valve at that time- she is not aware of any stenting  -He continues to have crackles on exam-we will continue IV Lasix-  -2D echo reveals that she has an EF of 40 to 45% and global hypokinesis-there is bilateral atrial dilatation and significant calcification of the mitral valve with moderate mitral regurg and severe aortic valve stenosis -I will request a cardiology eval for her aortic stenosis-at her age, I  feel that she does not need a cath or extensive work-up but does need to continue medical management and close follow-up  Active Problems:  Mild AKI -Creatinine has risen slightly to 1.28-continue to follow with diuresis  Hypokalemia - Due to Lasix-replace-follow-up on magnesium level    Chronic iron deficiency and blood loss anemia-  -history of chronic GI bleeding Iron deficiency, folate deficiency, B12 deficiency -Hemoglobin is 7.1 today-I have reviewed a progress note from her primary care doctor's office from 12/01/2019 which mentioned that her hemoglobin was 11.4 at that time -In regards to her iron deficiency anemia, no further work-up has been recommended by her gastroenterologist- last EGD and colonoscopy were in 2018- she was found to have a hiatal hernia and erythema and congestion in the antrum, internal hemorrhoids, diverticulosis and a polyp of 4 mm -of note she is on aspirin 81 mg twice a day as outpatient (she states she had an MI 3-4 yrs ago) which is on hold during this hospital stay -Iron levels are quite low despite taking oral iron supplements twice daily as outpatient Plan: - Will give her 1 unit of packed red blood cells as she remains quite weak -I will also give her IV iron-Feraheme 550 mg -She also has folate deficiency as her folate level is 4.2-I have started replacing this as well - She also has a low B12 level at 242- I will start replacing this as well Iron  13   UIBC  285   TIBC  298   Saturation Ratios  4   Ferritin  20   Folate  4.2     Vitamin B12  242    Gastro esophageal reflux disease -Continue PPI BID  Hypertension -She takes hydralazine 10 mg as outpatient however her blood pressures have been less than 120/80 and this is on hold  Hypothyroidism -TSH is 0.557-in the past it was  in the 80s and 90s  Hyperlipidemia -She is on a atorvastatin 40 mg daily  BPPV -Uses as needed meclizine for this-currently no symptoms  Very hard of hearing   Time spent in minutes: 40 minutes DVT prophylaxis: SCDs Code Status: Full code Family Communication:  Disposition Plan:  Status is: Inpatient  Remains inpatient appropriate because:Needs ongoing IV diuretics, blood, iron and a cardiology eval   Dispo: The patient is from: Home              Anticipated d/c is to: Home               Anticipated d/c date is: 2 days              Patient currently is not medically stable to d/c.      Consultants:   Cardiology Procedures:   2D echo 1. Left ventricular ejection fraction, by estimation, is 40 to 45%. The  left ventricle has mildly decreased function. The left ventricle  demonstrates global hypokinesis. There is mild concentric left ventricular  hypertrophy. Left ventricular diastolic  function could not be evaluated.  2. Right ventricular systolic function is mildly reduced. The right  ventricular size is mildly enlarged. There is normal pulmonary artery  systolic pressure.  3. Left atrial size was severely dilated.  4. Right atrial size was moderately dilated.  5. There is severe thickening and calcification of the subvalvular mitral  apparatus. There is severe annular calcification. The mitral valve has  both degenerative and rheumatic features. Moderate mitral valve  regurgitation. Mild to moderate mitral  stenosis. The mean mitral valve gradient is 6.0 mmHg with average heart  rate of 88 bpm.  6. The aortic valve is tricuspid. Aortic valve regurgitation is mild to  moderate. Severe aortic valve stenosis. Aortic valve Vmax measures 4.38  m/s.  Antimicrobials:  Anti-infectives (From admission, onward)   None       Objective: Vitals:   03/13/20 2026 03/14/20 0321 03/14/20 0338 03/14/20 0948  BP: (!) 100/58 116/69  96/64  Pulse: 88 99  88  Resp: 18 19    Temp: 98.9 F (37.2 C) 97.8 F (36.6 C)  98.2 F (36.8 C)  TempSrc: Oral Oral  Oral  SpO2: 97% 93%  96%  Weight:   58 kg   Height:        Intake/Output Summary (Last 24 hours) at 03/14/2020 0950 Last data filed at 03/13/2020 2036 Gross per 24 hour  Intake 360 ml  Output 401 ml  Net -41 ml   Filed Weights   03/12/20 1708 03/13/20 0100 03/14/20 0338  Weight: 59.4 kg 60 kg 58 kg    Examination: General exam: Appears comfortable  HEENT: PERRLA, oral mucosa moist, no sclera icterus or  thrush Respiratory system: Bilateral basilar crackles respiratory effort normal. Cardiovascular system: S1 & S2 heard, RRR. 2/6 aortic murmur.  Gastrointestinal system: Abdomen soft, non-tender, nondistended. Normal bowel sounds. Central nervous system: Alert and oriented. No focal neurological deficits. Extremities: No cyanosis, clubbing or edema Skin: No rashes or ulcers Psychiatry:  Mood & affect appropriate.     Data Reviewed: I have personally reviewed following labs and imaging studies  CBC: Recent Labs  Lab 03/12/20 1749 03/13/20 0508  WBC 11.8* 9.4  NEUTROABS 8.5*  --   HGB 7.5* 7.1*  HCT 25.0* 23.6*  MCV 97.7 98.3  PLT 230 214   Basic Metabolic Panel: Recent Labs  Lab 03/12/20 1749 03/14/20 0437  NA 140 142  K  3.5 3.3*  CL 106 104  CO2 24 26  GLUCOSE 122* 112*  BUN 36* 28*  CREATININE 1.31* 1.28*  CALCIUM 8.5* 8.7*   GFR: Estimated Creatinine Clearance: 22.6 mL/min (A) (by C-G formula based on SCr of 1.28 mg/dL (H)). Liver Function Tests: Recent Labs  Lab 03/12/20 1749  AST 15  ALT 7  ALKPHOS 57  BILITOT 0.7  PROT 6.6  ALBUMIN 3.0*   No results for input(s): LIPASE, AMYLASE in the last 168 hours. No results for input(s): AMMONIA in the last 168 hours. Coagulation Profile: No results for input(s): INR, PROTIME in the last 168 hours. Cardiac Enzymes: No results for input(s): CKTOTAL, CKMB, CKMBINDEX, TROPONINI in the last 168 hours. BNP (last 3 results) No results for input(s): PROBNP in the last 8760 hours. HbA1C: No results for input(s): HGBA1C in the last 72 hours. CBG: No results for input(s): GLUCAP in the last 168 hours. Lipid Profile: No results for input(s): CHOL, HDL, LDLCALC, TRIG, CHOLHDL, LDLDIRECT in the last 72 hours. Thyroid Function Tests: Recent Labs    03/13/20 0508  TSH 0.557   Anemia Panel: Recent Labs    03/13/20 1526  VITAMINB12 242  FOLATE 4.2*  FERRITIN 20  TIBC 298  IRON 13*  RETICCTPCT 5.9*   Urine  analysis:    Component Value Date/Time   COLORURINE YELLOW 03/13/2020 0001   APPEARANCEUR CLEAR 03/13/2020 0001   LABSPEC 1.010 03/13/2020 0001   PHURINE 5.0 03/13/2020 0001   GLUCOSEU NEGATIVE 03/13/2020 0001   HGBUR NEGATIVE 03/13/2020 0001   BILIRUBINUR NEGATIVE 03/13/2020 0001   KETONESUR NEGATIVE 03/13/2020 0001   PROTEINUR NEGATIVE 03/13/2020 0001   NITRITE NEGATIVE 03/13/2020 0001   LEUKOCYTESUR SMALL (A) 03/13/2020 0001   Sepsis Labs: @LABRCNTIP (procalcitonin:4,lacticidven:4) ) Recent Results (from the past 240 hour(s))  Culture, blood (routine x 2)     Status: None (Preliminary result)   Collection Time: 03/12/20  5:50 PM   Specimen: BLOOD  Result Value Ref Range Status   Specimen Description   Final    BLOOD RIGHT ANTECUBITAL Performed at [facility], [address] [place], [place] [address]    Special Requests   Final    BOTTLES DRAWN AEROBIC AND ANAEROBIC Blood Culture adequate volume Performed at [facility], [address] [place], [place] [address]    Culture   Final    NO GROWTH 2 DAYS Performed at [facility], [address] [place], [place] [address]    Report Status PENDING  Incomplete  SARS Coronavirus 2 by RT PCR (hospital order, performed in [facility] hospital lab) Nasopharyngeal Peripheral     Status: None   Collection Time: 03/12/20  5:52 PM   Specimen: Peripheral; Nasopharyngeal  Result Value Ref Range Status   SARS Coronavirus 2 NEGATIVE NEGATIVE Final    Comment: (NOTE) SARS-CoV-2 target nucleic acids are NOT DETECTED.  The SARS-CoV-2 RNA is generally detectable in upper and lower respiratory specimens during the acute phase of infection. The lowest concentration of SARS-CoV-2 viral copies this assay can detect is 250 copies / mL. A negative result does not preclude SARS-CoV-2 infection and should not be used as the sole basis for treatment or other patient management decisions.  A negative result may  occur with improper specimen collection / handling, submission of specimen other than nasopharyngeal swab, presence of viral mutation(s) within the areas targeted by this assay, and inadequate number of viral copies (<250 copies / mL). A  negative result must be combined with clinical observations, patient history, and epidemiological information.  Fact Sheet for Patients:   BoilerBrush.com.cy  Fact Sheet for Healthcare Providers: https://pope.com/  This test is not yet approved or  cleared by the [place] FDA and has been authorized for detection and/or diagnosis of SARS-CoV-2 by FDA under an Emergency Use Authorization (EUA).  This EUA will remain in effect (meaning this test can be used) for the duration of the COVID-19 declaration under Section 564(b)(1) of the Act, 21 U.S.C. section 360bbb-3(b)(1), unless the authorization is terminated or revoked sooner.  Performed at [facility], [address] [place], [place] [address]   Culture, blood (routine x 2)     Status: None (Preliminary result)   Collection Time: 03/12/20  6:07 PM   Specimen: BLOOD  Result Value Ref Range Status   Specimen Description   Final    BLOOD LEFT ANTECUBITAL Performed at [facility], [address] [place], [place] [address]    Special Requests   Final    BOTTLES DRAWN AEROBIC AND ANAEROBIC Blood Culture adequate volume Performed at [facility], [address] [place], [place] [address]    Culture   Final    NO GROWTH 2 DAYS Performed at [facility], [address] [place], [place] [address]    Report Status PENDING  Incomplete  Urine culture     Status: Abnormal   Collection Time: 03/12/20 11:54 PM   Specimen: Urine, Random  Result Value Ref Range Status   Specimen Description   Final    URINE, RANDOM Performed at [facility], [address] [place], [place] [address]    Special  Requests   Final    NONE Performed at [facility], [address] [place], [place] [address]    Culture MULTIPLE SPECIES PRESENT, SUGGEST RECOLLECTION (A)  Final   Report Status 03/13/2020 FINAL  Final         Radiology Studies: CT Angio Chest PE W/Cm &/Or Wo Cm  Result Date: 03/12/2020 CLINICAL DATA:  Weakness dizziness shortness of breath EXAM: CT ANGIOGRAPHY CHEST WITH CONTRAST TECHNIQUE: Multidetector CT imaging of the chest was performed using the standard protocol during bolus administration of intravenous contrast. Multiplanar CT image reconstructions and MIPs were obtained to evaluate the vascular anatomy. CONTRAST:  75mL OMNIPAQUE IOHEXOL 350 MG/ML SOLN COMPARISON:  None. FINDINGS: Cardiovascular: There is a optimal opacification of the pulmonary arteries. There is no central,segmental, or subsegmental filling defects within the pulmonary arteries. There is moderate cardiomegaly. Aortic valve and coronary artery calcifications are seen. Scattered aortic atherosclerosis is noted. No pericardial effusion or thickening. No evidence right heart strain. There is normal three-vessel brachiocephalic anatomy without proximal stenosis. The thoracic aorta is normal in appearance. Mediastinum/Nodes: No hilar, mediastinal, or axillary adenopathy. Thyroid gland, trachea, are unremarkable. There is a moderate hiatal hernia with reflux of fluid in the distal esophagus. Lungs/Pleura: Hazy ground-glass opacity seen at both lung bases with interlobular septal thickening. There is a small right and trace left pleural effusion present. No large airspace consolidation or pneumothorax. Upper Abdomen: No acute abnormalities present in the visualized portions of the upper abdomen. Musculoskeletal: No chest wall abnormality. No acute or significant osseous findings. Review of the MIP images confirms the above findings. IMPRESSION: No central, segmental, or subsegmental pulmonary embolism. Findings  suggestive interstitial edema. Small right and trace left pleural effusion Aortic Atherosclerosis (  ICD10-I70.0). Moderate hiatal hernia with gastroesophageal reflux Electronically Signed   By: Jonna ClarkBindu  Avutu M.D.   On: 03/12/2020 21:02   DG Chest Port 1 View  Result Date: 03/12/2020 CLINICAL DATA:  Weakness and shortness of breath EXAM: PORTABLE CHEST 1 VIEW COMPARISON:  06/12/2019 FINDINGS: The Chin overlies the apices. Midline trachea. Reverse apical lordotic positioning. Mild cardiomegaly. Small left pleural effusion. No pneumothorax. Low lung volumes. Mild pulmonary interstitial prominence and indistinctness. Left greater than right base airspace disease. IMPRESSION: 1. Cardiomegaly with mild pulmonary venous congestion and small left pleural effusion. 2. Bibasilar airspace disease, atelectasis or infection. Electronically Signed   By: Jeronimo GreavesKyle  Talbot M.D.   On: 03/12/2020 17:56   ECHOCARDIOGRAM COMPLETE  Result Date: 03/13/2020    ECHOCARDIOGRAM REPORT   Patient Name:   Norberto SorensonLIZBETH Grunow Date of Exam: 03/13/2020 Medical Rec #:  [id number]      Height:       62.0 in Accession #:    [id number]     Weight:       132.2 lb Date of Birth:  08/05/1926       BSA:          1.603 m Patient Age:    89 years       BP:           101/61 mmHg Patient Gender: F              HR:           88 bpm. Exam Location:  Inpatient Procedure: 2D Echo Indications:    acute diastolic CHF 428.31  History:        Patient has no prior history of Echocardiogram examinations.                 Risk Factors:Hypertension.  Sonographer:    Delcie RochLauren Pennington Referring Phys: [id number] [phone] JARED M GARDNER IMPRESSIONS  1. Left ventricular ejection fraction, by estimation, is 40 to 45%. The left ventricle has mildly decreased function. The left ventricle demonstrates global hypokinesis. There is mild concentric left ventricular hypertrophy. Left ventricular diastolic function could not be evaluated.  2. Right ventricular systolic function is mildly reduced. The right  ventricular size is mildly enlarged. There is normal pulmonary artery systolic pressure.  3. Left atrial size was severely dilated.  4. Right atrial size was moderately dilated.  5. There is severe thickening and calcification of the subvalvular mitral apparatus. There is severe annular calcification. The mitral valve has both degenerative and rheumatic features. Moderate mitral valve regurgitation. Mild to moderate mitral stenosis. The mean mitral valve gradient is 6.0 mmHg with average heart rate of 88 bpm.  6. The aortic valve is tricuspid. Aortic valve regurgitation is mild to moderate. Severe aortic valve stenosis. Aortic valve Vmax measures 4.38 m/s. FINDINGS  Left Ventricle: Left ventricular ejection fraction, by estimation, is 40 to 45%. The left ventricle has mildly decreased function. The left ventricle demonstrates global hypokinesis. The left ventricular internal cavity size was normal in size. There is  mild concentric left ventricular hypertrophy. Left ventricular diastolic function could not be evaluated due to mitral stenosis. Left ventricular diastolic function could not be evaluated. Right Ventricle: The right ventricular size is mildly enlarged. No increase in right ventricular wall thickness. Right ventricular systolic function is mildly reduced. There is normal pulmonary artery systolic pressure. The tricuspid regurgitant velocity  is 2.50 m/s, and with an assumed right atrial pressure of 8 mmHg, the estimated right ventricular systolic pressure is 33.0 mmHg.  Left Atrium: Left atrial size was severely dilated. Right Atrium: Right atrial size was moderately dilated. Pericardium: There is no evidence of pericardial effusion. Mitral Valve: There is severe thickening and calcification of the subvalvular apparatus. The mitral valve is degenerative in appearance. There is moderate thickening of the mitral valve leaflet(s). There is moderate calcification of the mitral valve leaflet(s). Severe mitral  annular calcification. Moderate mitral valve regurgitation, with centrally-directed jet. Mild to moderate mitral valve stenosis. MV peak gradient, 15.1 mmHg. The mean mitral valve gradient is 6.0 mmHg with average heart rate of 88 bpm. Tricuspid Valve: The tricuspid valve is normal in structure. Tricuspid valve regurgitation is trivial. Aortic Valve: The aortic valve is tricuspid. . There is severe thickening and severe calcifcation of the aortic valve. Aortic valve regurgitation is mild to moderate. Aortic regurgitation PHT measures 304 msec. Severe aortic stenosis is present. There is  severe thickening of the aortic valve. There is severe calcifcation of the aortic valve. Aortic valve mean gradient measures 50.5 mmHg. Aortic valve peak gradient measures 76.7 mmHg. Aortic valve area, by VTI measures 0.52 cm. Pulmonic Valve: The pulmonic valve was normal in structure. Pulmonic valve regurgitation is mild. Aorta: The aortic root and ascending aorta are structurally normal, with no evidence of dilitation. IAS/Shunts: No atrial level shunt detected by color flow Doppler.  LEFT VENTRICLE PLAX 2D LVIDd:         5.10 cm  Diastology LVIDs:         4.10 cm  LV e' lateral:   6.73 cm/s LV PW:         1.20 cm  LV E/e' lateral: 25.4 LV IVS:        1.30 cm  LV e' medial:    3.99 cm/s LVOT diam:     1.70 cm  LV E/e' medial:  42.9 LV SV:         54 LV SV Index:   34 LVOT Area:     2.27 cm  RIGHT VENTRICLE            IVC RV Basal diam:  4.66 cm    IVC diam: 2.50 cm RV S prime:     9.89 cm/s TAPSE (M-mode): 1.1 cm LEFT ATRIUM              Index       RIGHT ATRIUM           Index LA diam:        5.50 cm  3.43 cm/m  RA Area:     15.10 cm LA Vol (A2C):   114.0 ml 71.11 ml/m RA Volume:   37.20 ml  23.20 ml/m LA Vol (A4C):   65.9 ml  41.10 ml/m LA Biplane Vol: 86.2 ml  53.77 ml/m  AORTIC VALVE AV Area (Vmax):    0.48 cm AV Area (Vmean):   0.48 cm AV Area (VTI):     0.52 cm AV Vmax:           438.00 cm/s AV Vmean:           340.500 cm/s AV VTI:            1.038 m AV Peak Grad:      76.7 mmHg AV Mean Grad:      50.5 mmHg LVOT Vmax:         92.75 cm/s LVOT Vmean:        72.700 cm/s LVOT VTI:  0.237 m LVOT/AV VTI ratio: 0.23 AI PHT:            304 msec  AORTA Ao Root diam: 3.10 cm Ao Asc diam:  3.60 cm MITRAL VALVE                TRICUSPID VALVE MV Area (PHT): 4.21 cm     TR Peak grad:   25.0 mmHg MV Peak grad:  15.1 mmHg    TR Vmax:        250.00 cm/s MV Mean grad:  6.0 mmHg MV Vmax:       1.94 m/s     SHUNTS MV Vmean:      112.0 cm/s   Systemic VTI:  0.24 m MV Decel Time: 180 msec     Systemic Diam: 1.70 cm MR Peak grad: 159.3 mmHg MR Mean grad: 109.0 mmHg MR Vmax:      631.00 cm/s MR Vmean:     501.0 cm/s MV E velocity: 171.00 cm/s MV A velocity: 119.00 cm/s MV E/A ratio:  1.44 Mihai Croitoru MD Electronically signed by Thurmon Fair MD Signature Date/Time: 03/13/2020/6:03:07 PM    Final       Scheduled Meds: . sodium chloride   Intravenous Once  . folic acid  1 mg Oral BID  . furosemide  20 mg Intravenous Once  . furosemide  40 mg Intravenous Daily  . meclizine  12.5 mg Oral TID  . pantoprazole  40 mg Oral BID AC  . potassium chloride  40 mEq Oral BID  . sodium chloride flush  3 mL Intravenous Q12H   Continuous Infusions: . sodium chloride    . ferumoxytol       LOS: 1 day      Calvert Cantor, MD [facility] Pager: [email or web address] 03/14/2020, 9:50 AM

## 2020-03-14 NOTE — Plan of Care (Signed)

## 2020-03-14 NOTE — Consult Note (Signed)
Cardiology Consultation:   Patient ID: Morgan Holland MRN: [id number]; DOB: 02-18-1927  Admit date: 03/12/2020 Date of Consult: 03/14/2020  Primary Care Provider: Ardyth Gal, MD [facility] Cardiologist: remotely [place], [facility] Electrophysiologist:  None    Patient Profile:   Morgan Holland is a 86 y.o. female with a hx of PACs, AI s/p aortic valve replacement, AS,  Vertigo, chronic fatigue, SVT,  HTN, who is being seen today for the evaluation of CHF and afib at the request of Dr. Butler Denmark.  History of Present Illness:   Ms. Miltner has not been seen by [facility] in the past. She was seen at [facility] cardiology in 2017. She has a history of AV replacement 3-4 years ago at [place]. She says she was in the office when she had a MI and was brought to the hospital and found to need AVR. Says she did not receive any stents. Notes from care everywhere do not mention CAD. Also has documented history of AS. Unable to find echo, stress test, or cath in care everywhere.   The patient presented to the ED 03/12/20 for weakness. She was feeling her normal self the night before. Reported she woke up last night with severe generalized weakness, dizziness, confusion, and sob. Also felt some mild chest discomfort and felt her heart was racing. She does have chronic weakness however this time it seemed more severe. Prior to admission she was very functional for her age. She was able to buy her own groceries and drive. Over the last few days she felt generally more tired and sob. Denied LLE. She has chronic orthopnea and always sleeps elevated. No fever, chills. She takes iron for chronic anemia and has chronic constipation. She lives were her granddaughter.  In the ED BP 113/61, pulse 79, afebrile, RR 22, 92% O2. Edema noted on exam. Labs showed glucose 122, creatinine 1.31, BUN 36, albumin 3.0, WBC 11.8, Hgb 7.5. BNP 3,112. HS troponin [id number]. EKG showed NSR, nonspecific TW changes inferolateral,  TWI V1-V2. CXR showed cardiomegaly with mild pulmonary venous congestion and small left pleural effusion. CTA chest showed no PE, interstitial edema, small right and trace left pleural effusions, aortic atherosclerosis. COVID negative. Patient was given IVF and then IV lasix 20 mg and admitted for further work-up.     Past Medical History:  Diagnosis Date  . Hypertension   . MI (myocardial infarction) (HCC)   . Thyroid disease     Past Surgical History:  Procedure Laterality Date  . AORTIC VALVE REPLACEMENT       Home Medications:  Prior to Admission medications   Medication Sig Start Date End Date Taking? Authorizing Provider  acetaminophen (TYLENOL) 325 MG tablet Take 650 mg by mouth every 6 (six) hours as needed for headache (pain).   Yes [provider]  aspirin EC 81 MG tablet Take 81 mg by mouth 2 (two) times daily.    Yes [provider]  ferrous sulfate 325 (65 FE) MG EC tablet Take 1 tablet by mouth 2 (two) times daily. 05/10/19  Yes [provider]  hydrALAZINE (APRESOLINE) 10 MG tablet Take 10 mg by mouth daily. 12/30/19  Yes [provider]  levothyroxine (SYNTHROID) 100 MCG tablet Take 100 mcg by mouth every morning. 03/04/20  Yes [provider]  lisinopril (ZESTRIL) 10 MG tablet Take 10 mg by mouth every morning. 04/23/19  Yes [provider]  meclizine (ANTIVERT) 12.5 MG tablet Take 1 tablet (12.5 mg total) by mouth  3 (three) times daily as needed for dizziness. 06/16/19  Yes Dorcas Carrow, MD  metoprolol tartrate (LOPRESSOR) 50 MG tablet Take 1 tablet (50 mg total) by mouth 2 (two) times daily. 06/16/19 03/13/20 Yes Dorcas Carrow, MD  Multiple Vitamin (MULTIVITAMIN) capsule Take 1 capsule by mouth daily.   Yes [provider]  omeprazole (PRILOSEC) 40 MG capsule Take 40 mg by mouth 2 (two) times daily.  04/06/19  Yes [provider]  ondansetron (ZOFRAN-ODT) 8 MG disintegrating tablet Take 8 mg by mouth  every 12 (twelve) hours as needed for nausea. 11/23/19  Yes [provider]  triamcinolone cream (KENALOG) 0.1 % Apply 1 application topically See admin instructions. Apply topically to chin daily as needed for itching/rash 06/20/15  Yes [provider]    Inpatient Medications: Scheduled Meds: . sodium chloride   Intravenous Once  . cyanocobalamin  1,000 mcg Subcutaneous Daily  . folic acid  1 mg Oral BID  . furosemide  20 mg Intravenous Once  . furosemide  40 mg Intravenous Daily  . levothyroxine  100 mcg Oral q morning - 10a  . meclizine  12.5 mg Oral TID  . pantoprazole  40 mg Oral BID AC  . potassium chloride  40 mEq Oral BID  . sodium chloride flush  3 mL Intravenous Q12H   Continuous Infusions: . sodium chloride    . ferumoxytol     PRN Meds: sodium chloride, acetaminophen, meclizine, ondansetron (ZOFRAN) IV, sodium chloride flush  Allergies:   No Known Allergies  Social History:   Social History   Socioeconomic History  . Marital status: Widowed    Spouse name: Not on file  . Number of children: Not on file  . Years of education: Not on file  . Highest education level: Not on file  Occupational History  . Not on file  Tobacco Use  . Smoking status: Never Smoker  . Smokeless tobacco: Never Used  Substance and Sexual Activity  . Alcohol use: Not Currently  . Drug use: Never  . Sexual activity: Not on file  Other Topics Concern  . Not on file  Social History Narrative  . Not on file   Social Determinants of Health   Financial Resource Strain:   . Difficulty of Paying Living Expenses: Not on file  Food Insecurity:   . Worried About Programme researcher, broadcasting/film/video in the Last Year: Not on file  . Ran Out of Food in the Last Year: Not on file  Transportation Needs:   . Lack of Transportation (Medical): Not on file  . Lack of Transportation (Non-Medical): Not on file  Physical Activity:   . Days of Exercise per Week: Not on file  . Minutes of Exercise  per Session: Not on file  Stress:   . Feeling of Stress : Not on file  Social Connections:   . Frequency of Communication with Friends and Family: Not on file  . Frequency of Social Gatherings with Friends and Family: Not on file  . Attends Religious Services: Not on file  . Active Member of Clubs or Organizations: Not on file  . Attends Banker Meetings: Not on file  . Marital Status: Not on file  Intimate Partner Violence:   . Fear of Current or Ex-Partner: Not on file  . Emotionally Abused: Not on file  . Physically Abused: Not on file  . Sexually Abused: Not on file    Family History:   No family history on file.  ROS:  Please see the history of present illness.  All other ROS reviewed and negative.     Physical Exam/Data:   Vitals:   03/14/20 1031 03/14/20 1046 03/14/20 1101 03/14/20 1116  BP: 114/69 112/66 106/74 120/85  Pulse: 94 91 92 (!) 101  Resp:      Temp: 98 F (36.7 C) 98 F (36.7 C) 98 F (36.7 C) 98 F (36.7 C)  TempSrc:      SpO2: 97% 95% 96% 97%  Weight:      Height:        Intake/Output Summary (Last 24 hours) at 03/14/2020 1300 Last data filed at 03/13/2020 2036 Gross per 24 hour  Intake 360 ml  Output 401 ml  Net -41 ml   Last 3 Weights 03/14/2020 03/13/2020 03/12/2020  Weight (lbs) 127 lb 12.8 oz 132 lb 3.2 oz 131 lb  Weight (kg) 57.97 kg 59.966 kg 59.421 kg     Body mass index is 23.37 kg/m.  General:  Well nourished, well developed, in no acute distress HEENT: normal Lymph: no adenopathy Neck: mild JVD Endocrine:  No thryomegaly Vascular: No carotid bruits; FA pulses 2+ bilaterally without bruits  Cardiac:  normal S1, S2; RRR; + murmur  Lungs:  clear to auscultation bilaterally, no wheezing, rhonchi or rales  Abd: soft, nontender, no hepatomegaly  Ext: no edema Musculoskeletal:  No deformities, BUE and BLE strength normal and equal Skin: warm and dry  Neuro:  CNs 2-12 intact, no focal abnormalities noted Psych:  Normal  affect   EKG:  The EKG was personally reviewed and demonstrates:  NSR, 72 bpm, nonspecific TW changes inferolateral leads, TWI V1 and V2.  Telemetry:  Telemetry was personally reviewed and demonstrates:  NSR, HR 80-90s  Relevant CV Studies:  Echo 03/13/20 1. Left ventricular ejection fraction, by estimation, is 40 to 45%. The  left ventricle has mildly decreased function. The left ventricle  demonstrates global hypokinesis. There is mild concentric left ventricular  hypertrophy. Left ventricular diastolic  function could not be evaluated.  2. Right ventricular systolic function is mildly reduced. The right  ventricular size is mildly enlarged. There is normal pulmonary artery  systolic pressure.  3. Left atrial size was severely dilated.  4. Right atrial size was moderately dilated.  5. There is severe thickening and calcification of the subvalvular mitral  apparatus. There is severe annular calcification. The mitral valve has  both degenerative and rheumatic features. Moderate mitral valve  regurgitation. Mild to moderate mitral  stenosis. The mean mitral valve gradient is 6.0 mmHg with average heart  rate of 88 bpm.  6. The aortic valve is tricuspid. Aortic valve regurgitation is mild to  moderate. Severe aortic valve stenosis. Aortic valve Vmax measures 4.38   Laboratory Data:  High Sensitivity Troponin:   Recent Labs  Lab 03/12/20 1749 03/13/20 0508 03/13/20 0719  TROPONINIHS 176* 169* 182*     Chemistry Recent Labs  Lab 03/12/20 1749 03/14/20 0437  NA 140 142  K 3.5 3.3*  CL 106 104  CO2 24 26  GLUCOSE 122* 112*  BUN 36* 28*  CREATININE 1.31* 1.28*  CALCIUM 8.5* 8.7*  GFRNONAA 36* 37*  GFRAA 41* 42*  ANIONGAP 10 12    Recent Labs  Lab 03/12/20 1749  PROT 6.6  ALBUMIN 3.0*  AST 15  ALT 7  ALKPHOS 57  BILITOT 0.7   Hematology Recent Labs  Lab 03/12/20 1749 03/13/20 0508 03/13/20 1526  WBC 11.8* 9.4  --  RBC 2.56* 2.40* 2.55*  HGB 7.5*  7.1*  --   HCT 25.0* 23.6*  --   MCV 97.7 98.3  --   MCH 29.3 29.6  --   MCHC 30.0 30.1  --   RDW 16.3* 15.9*  --   PLT 230 214  --    BNP Recent Labs  Lab 03/12/20 1749  BNP 3,112.4*    DDimer No results for input(s): DDIMER in the last 168 hours.   Radiology/Studies:  CT Angio Chest PE W/Cm &/Or Wo Cm  Result Date: 03/12/2020 CLINICAL DATA:  Weakness dizziness shortness of breath EXAM: CT ANGIOGRAPHY CHEST WITH CONTRAST TECHNIQUE: Multidetector CT imaging of the chest was performed using the standard protocol during bolus administration of intravenous contrast. Multiplanar CT image reconstructions and MIPs were obtained to evaluate the vascular anatomy. CONTRAST:  75mL OMNIPAQUE IOHEXOL 350 MG/ML SOLN COMPARISON:  None. FINDINGS: Cardiovascular: There is a optimal opacification of the pulmonary arteries. There is no central,segmental, or subsegmental filling defects within the pulmonary arteries. There is moderate cardiomegaly. Aortic valve and coronary artery calcifications are seen. Scattered aortic atherosclerosis is noted. No pericardial effusion or thickening. No evidence right heart strain. There is normal three-vessel brachiocephalic anatomy without proximal stenosis. The thoracic aorta is normal in appearance. Mediastinum/Nodes: No hilar, mediastinal, or axillary adenopathy. Thyroid gland, trachea, are unremarkable. There is a moderate hiatal hernia with reflux of fluid in the distal esophagus. Lungs/Pleura: Hazy ground-glass opacity seen at both lung bases with interlobular septal thickening. There is a small right and trace left pleural effusion present. No large airspace consolidation or pneumothorax. Upper Abdomen: No acute abnormalities present in the visualized portions of the upper abdomen. Musculoskeletal: No chest wall abnormality. No acute or significant osseous findings. Review of the MIP images confirms the above findings. IMPRESSION: No central, segmental, or subsegmental  pulmonary embolism. Findings suggestive interstitial edema. Small right and trace left pleural effusion Aortic Atherosclerosis (ICD10-I70.0). Moderate hiatal hernia with gastroesophageal reflux Electronically Signed   By: Jonna Clark M.D.   On: 03/12/2020 21:02   DG Chest Port 1 View  Result Date: 03/12/2020 CLINICAL DATA:  Weakness and shortness of breath EXAM: PORTABLE CHEST 1 VIEW COMPARISON:  06/12/2019 FINDINGS: The Chin overlies the apices. Midline trachea. Reverse apical lordotic positioning. Mild cardiomegaly. Small left pleural effusion. No pneumothorax. Low lung volumes. Mild pulmonary interstitial prominence and indistinctness. Left greater than right base airspace disease. IMPRESSION: 1. Cardiomegaly with mild pulmonary venous congestion and small left pleural effusion. 2. Bibasilar airspace disease, atelectasis or infection. Electronically Signed   By: Jeronimo Greaves M.D.   On: 03/12/2020 17:56   ECHOCARDIOGRAM COMPLETE  Result Date: 03/13/2020    ECHOCARDIOGRAM REPORT   Patient Name:   NUALA CHILES Date of Exam: 03/13/2020 Medical Rec #:  [id number]      Height:       62.0 in Accession #:    [id number]     Weight:       132.2 lb Date of Birth:  1926-12-15       BSA:          1.603 m Patient Age:    89 years       BP:           101/61 mmHg Patient Gender: F              HR:           88 bpm. Exam Location:  Inpatient Procedure: 2D Echo Indications:  acute diastolic CHF 428.31  History:        Patient has no prior history of Echocardiogram examinations.                 Risk Factors:Hypertension.  Sonographer:    Delcie Roch Referring Phys: [phone] JARED M GARDNER IMPRESSIONS  1. Left ventricular ejection fraction, by estimation, is 40 to 45%. The left ventricle has mildly decreased function. The left ventricle demonstrates global hypokinesis. There is mild concentric left ventricular hypertrophy. Left ventricular diastolic function could not be evaluated.  2. Right ventricular systolic function  is mildly reduced. The right ventricular size is mildly enlarged. There is normal pulmonary artery systolic pressure.  3. Left atrial size was severely dilated.  4. Right atrial size was moderately dilated.  5. There is severe thickening and calcification of the subvalvular mitral apparatus. There is severe annular calcification. The mitral valve has both degenerative and rheumatic features. Moderate mitral valve regurgitation. Mild to moderate mitral stenosis. The mean mitral valve gradient is 6.0 mmHg with average heart rate of 88 bpm.  6. The aortic valve is tricuspid. Aortic valve regurgitation is mild to moderate. Severe aortic valve stenosis. Aortic valve Vmax measures 4.38 m/s. FINDINGS  Left Ventricle: Left ventricular ejection fraction, by estimation, is 40 to 45%. The left ventricle has mildly decreased function. The left ventricle demonstrates global hypokinesis. The left ventricular internal cavity size was normal in size. There is  mild concentric left ventricular hypertrophy. Left ventricular diastolic function could not be evaluated due to mitral stenosis. Left ventricular diastolic function could not be evaluated. Right Ventricle: The right ventricular size is mildly enlarged. No increase in right ventricular wall thickness. Right ventricular systolic function is mildly reduced. There is normal pulmonary artery systolic pressure. The tricuspid regurgitant velocity  is 2.50 m/s, and with an assumed right atrial pressure of 8 mmHg, the estimated right ventricular systolic pressure is 33.0 mmHg. Left Atrium: Left atrial size was severely dilated. Right Atrium: Right atrial size was moderately dilated. Pericardium: There is no evidence of pericardial effusion. Mitral Valve: There is severe thickening and calcification of the subvalvular apparatus. The mitral valve is degenerative in appearance. There is moderate thickening of the mitral valve leaflet(s). There is moderate calcification of the mitral  valve leaflet(s). Severe mitral annular calcification. Moderate mitral valve regurgitation, with centrally-directed jet. Mild to moderate mitral valve stenosis. MV peak gradient, 15.1 mmHg. The mean mitral valve gradient is 6.0 mmHg with average heart rate of 88 bpm. Tricuspid Valve: The tricuspid valve is normal in structure. Tricuspid valve regurgitation is trivial. Aortic Valve: The aortic valve is tricuspid. . There is severe thickening and severe calcifcation of the aortic valve. Aortic valve regurgitation is mild to moderate. Aortic regurgitation PHT measures 304 msec. Severe aortic stenosis is present. There is  severe thickening of the aortic valve. There is severe calcifcation of the aortic valve. Aortic valve mean gradient measures 50.5 mmHg. Aortic valve peak gradient measures 76.7 mmHg. Aortic valve area, by VTI measures 0.52 cm. Pulmonic Valve: The pulmonic valve was normal in structure. Pulmonic valve regurgitation is mild. Aorta: The aortic root and ascending aorta are structurally normal, with no evidence of dilitation. IAS/Shunts: No atrial level shunt detected by color flow Doppler.  LEFT VENTRICLE PLAX 2D LVIDd:         5.10 cm  Diastology LVIDs:         4.10 cm  LV e' lateral:   6.73 cm/s LV PW:  1.20 cm  LV E/e' lateral: 25.4 LV IVS:        1.30 cm  LV e' medial:    3.99 cm/s LVOT diam:     1.70 cm  LV E/e' medial:  42.9 LV SV:         54 LV SV Index:   34 LVOT Area:     2.27 cm  RIGHT VENTRICLE            IVC RV Basal diam:  4.66 cm    IVC diam: 2.50 cm RV S prime:     9.89 cm/s TAPSE (M-mode): 1.1 cm LEFT ATRIUM              Index       RIGHT ATRIUM           Index LA diam:        5.50 cm  3.43 cm/m  RA Area:     15.10 cm LA Vol (A2C):   114.0 ml 71.11 ml/m RA Volume:   37.20 ml  23.20 ml/m LA Vol (A4C):   65.9 ml  41.10 ml/m LA Biplane Vol: 86.2 ml  53.77 ml/m  AORTIC VALVE AV Area (Vmax):    0.48 cm AV Area (Vmean):   0.48 cm AV Area (VTI):     0.52 cm AV Vmax:            438.00 cm/s AV Vmean:          340.500 cm/s AV VTI:            1.038 m AV Peak Grad:      76.7 mmHg AV Mean Grad:      50.5 mmHg LVOT Vmax:         92.75 cm/s LVOT Vmean:        72.700 cm/s LVOT VTI:          0.237 m LVOT/AV VTI ratio: 0.23 AI PHT:            304 msec  AORTA Ao Root diam: 3.10 cm Ao Asc diam:  3.60 cm MITRAL VALVE                TRICUSPID VALVE MV Area (PHT): 4.21 cm     TR Peak grad:   25.0 mmHg MV Peak grad:  15.1 mmHg    TR Vmax:        250.00 cm/s MV Mean grad:  6.0 mmHg MV Vmax:       1.94 m/s     SHUNTS MV Vmean:      112.0 cm/s   Systemic VTI:  0.24 m MV Decel Time: 180 msec     Systemic Diam: 1.70 cm MR Peak grad: 159.3 mmHg MR Mean grad: 109.0 mmHg MR Vmax:      631.00 cm/s MR Vmean:     501.0 cm/s MV E velocity: 171.00 cm/s MV A velocity: 119.00 cm/s MV E/A ratio:  1.44 Mihai Croitoru MD Electronically signed by Thurmon Fair MD Signature Date/Time: 03/13/2020/6:03:07 PM    Final     Assessment and Plan:   Acute on chronic systolic CHF/CM - presented with generalized weakness, confusion, dizziness, and sob. Had been feeling tired and weak the last week or so. Brief chest discomfort. BNP found to be up to 3000. Hs troponin elevated as well. Also has chronic anemia. Imaging with interstitial edema. Given IVF and IV lasix in the ED - IV lasix 40 mg daily - Not great urine output overnight  however weight down 5 lbs.  - Patient feels breathing is better - Echo showed LVEF 40-45%, global hypokinesis, RV function mildly reduced, severely dilated LA, mod dilated RA, mod MR, mild to mod MS, mild ot mod AI, severe AS - Patient does not appear to be significantly volume up on exam, however abdomen very distended possibly from constipation.  - Monitor daily weights, I/Os, and creatinine with diuresis.  - Unclear etiology for CM. Could be ischemic vs valve disease. If she undergoes work-up for valve replacement would require ischemic eval prior to that - Will add back home Lopressor at  lower dose.  - ACE held for creatinine 1.28.   Elevated troponin with possible CAD - patient reported prior MI with no stenting in the past. Unable to find documentation of this - On admission HS troponin elevated with relatively flat trend, however can continue to trend. CTA chest noted scattered aortic atherosclerosis. Likely has some CAD - check lipids and A1C - Pt had some brief chest discomfort on presentation. Does not sound ischemic in nature. Unsure patient would benefit from ischemic eval given age and lack of symptoms. Continue Aspirin   S/P AVR/ severe AS - s/p AVR 4-5 years ago at [facility]. Notes say non-rheumatic AV disease with prosthetic heart valve. Not on a blood thinner, just taking aspirin likely with h/o of chronic anemia/GIB - Echo this admission showed mildly reduced EF with mild to mod AI and severe AS with Vmax 4.1m/s, peak gradient 76.10mmHg, mean gradient 50.38mmHg - consider Structural heart team consult for recommendations. Given that patient was very functional at baseline might be a good candidate, will discuss further with MD.   MR/MS - mod MR  - mild to mod stenosis with mean gradient  Hyperlipidemia - atorvastatin 40 mg daily - FLP tomorrow AM  HTN - pta Hydralazine 10 mg, Lopressor 50 mg BID, lisinopril 10 mg daily - ACE held for renal function - continue lopressor and lasix  Chronic Anemia/H/o of chronic GIB/GERD - Hgb 7.5 on admission. Hgb lower today ordered 1 unit PRBC,  - pta PO iron>>given IV iron given today and folate - follow CBC - Aspirin on hold - per IM  Hypothyroidism - TSH normal  For questions or updates, please contact [facility] Please consult [email or web address] for contact info under    Signed, [place] David Stall, PA-C  03/14/2020 1:00 PM

## 2020-03-15 ENCOUNTER — Encounter (HOSPITAL_COMMUNITY): Payer: Self-pay | Admitting: Internal Medicine

## 2020-03-15 LAB — TYPE AND SCREEN
ABO/RH(D): O POS
Antibody Screen: NEGATIVE
Unit division: 0

## 2020-03-15 LAB — LIPID PANEL
Cholesterol: 139 mg/dL (ref 0–200)
HDL: 30 mg/dL — ABNORMAL LOW (ref >40)
LDL Cholesterol: 75 mg/dL (ref 0–99)
Total CHOL/HDL Ratio: 4.6 RATIO
Triglycerides: 168 mg/dL — ABNORMAL HIGH (ref <150)
VLDL: 34 mg/dL (ref 0–40)

## 2020-03-15 LAB — CBC
HCT: 28.9 % — ABNORMAL LOW (ref 36.0–46.0)
Hemoglobin: 8.8 g/dL — ABNORMAL LOW (ref 12.0–15.0)
MCH: 28.4 pg (ref 26.0–34.0)
MCHC: 30.4 g/dL (ref 30.0–36.0)
MCV: 93.2 fL (ref 80.0–100.0)
Platelets: 267 10*3/uL (ref 150–400)
RBC: 3.1 MIL/uL — ABNORMAL LOW (ref 3.87–5.11)
RDW: 17.8 % — ABNORMAL HIGH (ref 11.5–15.5)
WBC: 8.5 10*3/uL (ref 4.0–10.5)
nRBC: 0.2 % (ref 0.0–0.2)

## 2020-03-15 LAB — BASIC METABOLIC PANEL
Anion gap: 10 (ref 5–15)
BUN: 25 mg/dL — ABNORMAL HIGH (ref 8–23)
CO2: 29 mmol/L (ref 22–32)
Calcium: 8.7 mg/dL — ABNORMAL LOW (ref 8.9–10.3)
Chloride: 101 mmol/L (ref 98–111)
Creatinine, Ser: 1.32 mg/dL — ABNORMAL HIGH (ref 0.44–1.00)
GFR calc Af Amer: 41 mL/min — ABNORMAL LOW (ref >60)
GFR calc non Af Amer: 35 mL/min — ABNORMAL LOW (ref >60)
Glucose, Bld: 107 mg/dL — ABNORMAL HIGH (ref 70–99)
Potassium: 4.3 mmol/L (ref 3.5–5.1)
Sodium: 140 mmol/L (ref 135–145)

## 2020-03-15 LAB — BPAM RBC
Blood Product Expiration Date: 202109082359
ISSUE DATE / TIME: 202109020941
Unit Type and Rh: 5100

## 2020-03-15 MED ORDER — FUROSEMIDE 10 MG/ML IJ SOLN
80.0000 mg | Freq: Every day | INTRAMUSCULAR | Status: DC
  Administered 2020-03-15: 80 mg via INTRAVENOUS
  Filled 2020-03-15: qty 8

## 2020-03-15 MED ORDER — POLYETHYLENE GLYCOL 3350 17 G PO PACK
17.0000 g | PACK | Freq: Every day | ORAL | Status: DC
  Administered 2020-03-15 – 2020-03-16 (×2): 17 g via ORAL
  Filled 2020-03-15 (×3): qty 1

## 2020-03-15 MED ORDER — VITAMIN B-12 1000 MCG PO TABS
1000.0000 ug | ORAL_TABLET | Freq: Every day | ORAL | Status: DC
  Administered 2020-03-16: 1000 ug via ORAL
  Filled 2020-03-15 (×2): qty 1

## 2020-03-15 MED ORDER — FUROSEMIDE 10 MG/ML IJ SOLN: 80.0000 mg | Freq: Every day | INTRAMUSCULAR | Status: DC

## 2020-03-15 NOTE — Progress Notes (Signed)
O2 assessment completed 96% room air at resting 96% room air with exertion

## 2020-03-15 NOTE — Consult Note (Addendum)
Structural Heart Team Consultation:   Patient ID: Morgan Holland MRN: [id number]; DOB: 11-Dec-1926  Admit date: 03/12/2020 Date of Consult: 03/15/2020  Primary Care Provider: Ardyth Gal, MD [facility] Cardiologist: Chilton Si Consulting Physician: Chilton Si  Patient Profile:   Morgan Holland is a 86 y.o. female with a history of aortic valve disease s/p bioprosthetic AVR in 2014, SVT, HTN, vertigo and hypothyroidism who is being seen today for the evaluation of severe bioprosthetic AVR stenosis at the request of Dr. Duke Salvia.   History of Present Illness:   Morgan Holland  is a 86 y.o. female with a history of aortic valve disease s/p bioprosthetic AVR in 2014, SVT, HTN, vertigo and hypothyroidism who is being seen today for the evaluation of severe bioprosthetic AVR stenosis at the request of Dr. Duke Salvia. She had AVR in 2014 at [facility] with placement of a bioprosthetic valve. She has not followed with cardiology since then. She has a history of HTN and this has been well controlled. She is now admitted to [facility] with c/o weakness, dyspnea and chest pain. Troponin mildly elevated. She was found to be anemic with a Hgb of 7.1. Echo 03/13/20 with LVEF=40-45% with global hypokinesis. There is thickening and calcification of the subvalvular mitral apparatus with severe annular calcification. There is moderate MR with mild to moderate mitral stenosis. The bioprosthetic AV leaflets are thickened and do not open well. There is mild to moderate aortic insufficiency. There is severe stenosis. Mean gradient 50.5 mHg, peak gradient 76.7 mmHg, AVA 0.48 cm2, dimensionless index 0.23. She was felt to be volume overloaded on presentation and has diuresed well with improvement in her symptoms. She has been transfused and her Hgb is now 8.8.   She tells me today that she has had dyspnea on exertion, dizziness and fatigue. She has no chest pain. She lives with her granddaughter in [place]. She is  very active at home. She is a retired as an Film/video editor. She has partial dentures and denies any active dental problems.    Past Medical History:  Diagnosis Date  . Hypertension   . MI (myocardial infarction) (HCC)   . S/P AVR   . Thyroid disease     Past Surgical History:  Procedure Laterality Date  . AORTIC VALVE REPLACEMENT       Home Medications:  Prior to Admission medications   Medication Sig Start Date End Date Taking? Authorizing Provider  acetaminophen (TYLENOL) 325 MG tablet Take 650 mg by mouth every 6 (six) hours as needed for headache (pain).   Yes [provider]  aspirin EC 81 MG tablet Take 81 mg by mouth 2 (two) times daily.    Yes [provider]  ferrous sulfate 325 (65 FE) MG EC tablet Take 1 tablet by mouth 2 (two) times daily. 05/10/19  Yes [provider]  hydrALAZINE (APRESOLINE) 10 MG tablet Take 10 mg by mouth daily. 12/30/19  Yes [provider]  levothyroxine (SYNTHROID) 100 MCG tablet Take 100 mcg by mouth every morning. 03/04/20  Yes [provider]  lisinopril (ZESTRIL) 10 MG tablet Take 10 mg by mouth every morning. 04/23/19  Yes [provider]  meclizine (ANTIVERT) 12.5 MG tablet Take 1 tablet (12.5 mg total) by mouth 3 (three) times daily as needed for dizziness. 06/16/19  Yes Dorcas Carrow, MD  metoprolol tartrate (LOPRESSOR) 50 MG tablet Take 1 tablet (50 mg total) by mouth 2 (two) times daily. 06/16/19 03/13/20 Yes Dorcas Carrow, MD  Multiple Vitamin (MULTIVITAMIN) capsule Take 1 capsule by mouth daily.   Yes [provider]  omeprazole (PRILOSEC) 40 MG capsule Take 40 mg by mouth 2 (two) times daily.  04/06/19  Yes [provider]  ondansetron (ZOFRAN-ODT) 8 MG disintegrating tablet Take 8 mg by mouth every 12 (twelve) hours as needed for nausea. 11/23/19  Yes [provider]  triamcinolone cream (KENALOG) 0.1 % Apply 1 application topically See admin instructions. Apply  topically to chin daily as needed for itching/rash 06/20/15  Yes [provider]    Inpatient Medications: Scheduled Meds: . folic acid  1 mg Oral BID  . furosemide  80 mg Intravenous Daily  . levothyroxine  100 mcg Oral q morning - 10a  . meclizine  12.5 mg Oral TID  . metoprolol tartrate  12.5 mg Oral BID  . pantoprazole  40 mg Oral BID AC  . polyethylene glycol  17 g Oral Daily  . sodium chloride flush  3 mL Intravenous Q12H  . vitamin B-12  1,000 mcg Oral Daily   Continuous Infusions: . sodium chloride     PRN Meds: sodium chloride, acetaminophen, alum & mag hydroxide-simeth, meclizine, ondansetron (ZOFRAN) IV, sodium chloride flush  Allergies:   No Known Allergies  Social History:   Social History   Socioeconomic History  . Marital status: Widowed    Spouse name: Not on file  . Number of children: Not on file  . Years of education: Not on file  . Highest education level: Not on file  Occupational History  . Not on file  Tobacco Use  . Smoking status: Never Smoker  . Smokeless tobacco: Never Used  Substance and Sexual Activity  . Alcohol use: Not Currently  . Drug use: Never  . Sexual activity: Not on file  Other Topics Concern  . Not on file  Social History Narrative  . Not on file   Social Determinants of Health   Financial Resource Strain:   . Difficulty of Paying Living Expenses: Not on file  Food Insecurity:   . Worried About Programme researcher, broadcasting/film/videounning Out of Food in the Last Year: Not on file  . Ran Out of Food in the Last Year: Not on file  Transportation Needs:   . Lack of Transportation (Medical): Not on file  . Lack of Transportation (Non-Medical): Not on file  Physical Activity:   . Days of Exercise per Week: Not on file  . Minutes of Exercise per Session: Not on file  Stress:   . Feeling of Stress : Not on file  Social Connections:   . Frequency of Communication with Friends and Family: Not on file  . Frequency of Social Gatherings with Friends and  Family: Not on file  . Attends Religious Services: Not on file  . Active Member of Clubs or Organizations: Not on file  . Attends BankerClub or Organization Meetings: Not on file  . Marital Status: Not on file  Intimate Partner Violence:   . Fear of Current or Ex-Partner: Not on file  . Emotionally Abused: Not on file  . Physically Abused: Not on file  . Sexually Abused: Not on file    Family History:   Family History  Problem Relation Age of Onset  . Cancer Mother   . Heart attack Father      ROS:  Please see the history of present illness.  All other ROS reviewed and negative.     Physical Exam/Data:   Vitals:   03/14/20 2300  03/15/20 0330 03/15/20 0939 03/15/20 1129  BP: 111/63 102/66 103/69 106/60  Pulse: 89 85 87 82  Resp:  19    Temp: 98.6 F (37 C) 98.1 F (36.7 C) 98.3 F (36.8 C)   TempSrc: Oral Oral Oral   SpO2: 94% 98% 96%   Weight:  57.2 kg    Height:        Intake/Output Summary (Last 24 hours) at 03/15/2020 1421 Last data filed at 03/15/2020 1243 Gross per 24 hour  Intake 650 ml  Output 1195 ml  Net -545 ml   Last 3 Weights 03/15/2020 03/14/2020 03/13/2020  Weight (lbs) 126 lb 1.6 oz 127 lb 12.8 oz 132 lb 3.2 oz  Weight (kg) 57.199 kg 57.97 kg 59.966 kg     Body mass index is 23.06 kg/m.  General:  Well nourished, well developed, in no acute distress HEENT: normal Lymph: no adenopathy Neck: no JVD Endocrine:  No thryomegaly Vascular: No carotid bruits; FA pulses 2+ bilaterally without bruits  Cardiac:  normal S1, S2; RRR;  Loud systolic murmur.  Lungs:  clear to auscultation bilaterally, no wheezing, rhonchi or rales  Abd: soft, nontender, no hepatomegaly  Ext: no edema Musculoskeletal:  No deformities, BUE and BLE strength normal and equal Skin: warm and dry  Neuro:  CNs 2-12 intact, no focal abnormalities noted Psych:  Normal affect   EKG:  The EKG was personally reviewed and demonstrates:  Sinus, anterolateral T wave inversions/ST  depression Telemetry:  Telemetry was personally reviewed and demonstrates:  Sinus  Laboratory Data:  High Sensitivity Troponin:   Recent Labs  Lab 03/12/20 1749 03/13/20 0508 03/13/20 0719  TROPONINIHS 176* 169* 182*     Chemistry Recent Labs  Lab 03/12/20 1749 03/14/20 0437 03/15/20 0442  NA 140 142 140  K 3.5 3.3* 4.3  CL 106 104 101  CO2 24 26 29   GLUCOSE 122* 112* 107*  BUN 36* 28* 25*  CREATININE 1.31* 1.28* 1.32*  CALCIUM 8.5* 8.7* 8.7*  GFRNONAA 36* 37* 35*  GFRAA 41* 42* 41*  ANIONGAP 10 12 10     Recent Labs  Lab 03/12/20 1749  PROT 6.6  ALBUMIN 3.0*  AST 15  ALT 7  ALKPHOS 57  BILITOT 0.7   Hematology Recent Labs  Lab 03/12/20 1749 03/12/20 1749 03/13/20 0508 03/13/20 1526 03/15/20 0442  WBC 11.8*  --  9.4  --  8.5  RBC 2.56*   < > 2.40* 2.55* 3.10*  HGB 7.5*  --  7.1*  --  8.8*  HCT 25.0*  --  23.6*  --  28.9*  MCV 97.7  --  98.3  --  93.2  MCH 29.3  --  29.6  --  28.4  MCHC 30.0  --  30.1  --  30.4  RDW 16.3*  --  15.9*  --  17.8*  PLT 230  --  214  --  267   < > = values in this interval not displayed.   BNP Recent Labs  Lab 03/12/20 1749  BNP 3,112.4*    DDimer No results for input(s): DDIMER in the last 168 hours.   CV Studies:  Echo 03/13/20: 1. Left ventricular ejection fraction, by estimation, is 40 to 45%. The  left ventricle has mildly decreased function. The left ventricle  demonstrates global hypokinesis. There is mild concentric left ventricular  hypertrophy. Left ventricular diastolic  function could not be evaluated.  2. Right ventricular systolic function is mildly reduced. The right  ventricular size  is mildly enlarged. There is normal pulmonary artery  systolic pressure.  3. Left atrial size was severely dilated.  4. Right atrial size was moderately dilated.  5. There is severe thickening and calcification of the subvalvular mitral  apparatus. There is severe annular calcification. The mitral valve has   both degenerative and rheumatic features. Moderate mitral valve  regurgitation. Mild to moderate mitral  stenosis. The mean mitral valve gradient is 6.0 mmHg with average heart  rate of 88 bpm.  6. The aortic valve is tricuspid. Aortic valve regurgitation is mild to  moderate. Severe aortic valve stenosis. Aortic valve Vmax measures 4.38  m/s.   FINDINGS  Left Ventricle: Left ventricular ejection fraction, by estimation, is 40  to 45%. The left ventricle has mildly decreased function. The left  ventricle demonstrates global hypokinesis. The left ventricular internal  cavity size was normal in size. There is  mild concentric left ventricular hypertrophy. Left ventricular diastolic  function could not be evaluated due to mitral stenosis. Left ventricular  diastolic function could not be evaluated.   Right Ventricle: The right ventricular size is mildly enlarged. No  increase in right ventricular wall thickness. Right ventricular systolic  function is mildly reduced. There is normal pulmonary artery systolic  pressure. The tricuspid regurgitant velocity  is 2.50 m/s, and with an assumed right atrial pressure of 8 mmHg, the  estimated right ventricular systolic pressure is 33.0 mmHg.   Left Atrium: Left atrial size was severely dilated.   Right Atrium: Right atrial size was moderately dilated.   Pericardium: There is no evidence of pericardial effusion.   Mitral Valve: There is severe thickening and calcification of the  subvalvular apparatus. The mitral valve is degenerative in appearance.  There is moderate thickening of the mitral valve leaflet(s). There is  moderate calcification of the mitral valve  leaflet(s). Severe mitral annular calcification. Moderate mitral valve  regurgitation, with centrally-directed jet. Mild to moderate mitral valve  stenosis. MV peak gradient, 15.1 mmHg. The mean mitral valve gradient is  6.0 mmHg with average heart rate of  88 bpm.    Tricuspid Valve: The tricuspid valve is normal in structure. Tricuspid  valve regurgitation is trivial.   Aortic Valve: The aortic valve is tricuspid. . There is severe thickening  and severe calcifcation of the aortic valve. Aortic valve regurgitation is  mild to moderate. Aortic regurgitation PHT measures 304 msec. Severe  aortic stenosis is present. There is  severe thickening of the aortic valve. There is severe calcifcation of  the aortic valve. Aortic valve mean gradient measures 50.5 mmHg. Aortic  valve peak gradient measures 76.7 mmHg. Aortic valve area, by VTI measures  0.52 cm.   Pulmonic Valve: The pulmonic valve was normal in structure. Pulmonic valve  regurgitation is mild.   Aorta: The aortic root and ascending aorta are structurally normal, with  no evidence of dilitation.   IAS/Shunts: No atrial level shunt detected by color flow Doppler.     LEFT VENTRICLE  PLAX 2D  LVIDd:     5.10 cm Diastology  LVIDs:     4.10 cm LV e' lateral:  6.73 cm/s  LV PW:     1.20 cm LV E/e' lateral: 25.4  LV IVS:    1.30 cm LV e' medial:  3.99 cm/s  LVOT diam:   1.70 cm LV E/e' medial: 42.9  LV SV:     54  LV SV Index:  34  LVOT Area:   2.27 cm  RIGHT VENTRICLE      IVC  RV Basal diam: 4.66 cm  IVC diam: 2.50 cm  RV S prime:   9.89 cm/s  TAPSE (M-mode): 1.1 cm   LEFT ATRIUM       Index    RIGHT ATRIUM      Index  LA diam:    5.50 cm 3.43 cm/m RA Area:   15.10 cm  LA Vol (A2C):  114.0 ml 71.11 ml/m RA Volume:  37.20 ml 23.20 ml/m  LA Vol (A4C):  65.9 ml 41.10 ml/m  LA Biplane Vol: 86.2 ml 53.77 ml/m  AORTIC VALVE  AV Area (Vmax):  0.48 cm  AV Area (Vmean):  0.48 cm  AV Area (VTI):   0.52 cm  AV Vmax:      438.00 cm/s  AV Vmean:     340.500 cm/s  AV VTI:      1.038 m  AV Peak Grad:   76.7 mmHg  AV Mean Grad:   50.5 mmHg  LVOT Vmax:     92.75 cm/s   LVOT Vmean:    72.700 cm/s  LVOT VTI:     0.237 m  LVOT/AV VTI ratio: 0.23  AI PHT:      304 msec    AORTA  Ao Root diam: 3.10 cm  Ao Asc diam: 3.60 cm   MITRAL VALVE        TRICUSPID VALVE  MV Area (PHT): 4.21 cm   TR Peak grad:  25.0 mmHg  MV Peak grad: 15.1 mmHg  TR Vmax:    250.00 cm/s  MV Mean grad: 6.0 mmHg  MV Vmax:    1.94 m/s   SHUNTS  MV Vmean:   112.0 cm/s  Systemic VTI: 0.24 m  MV Decel Time: 180 msec   Systemic Diam: 1.70 cm  MR Peak grad: 159.3 mmHg  MR Mean grad: 109.0 mmHg  MR Vmax:   631.00 cm/s  MR Vmean:   501.0 cm/s  MV E velocity: 171.00 cm/s  MV A velocity: 119.00 cm/s  MV E/A ratio: 1.44   Assessment and Plan:   1. Severe aortic valve stenosis: She has severe, stage D aortic valve stenosis due to failure of her bioprosthetic AVR. I have personally reviewed the echo images. The aortic valve leaflets are thickened, calcified with limited leaflet mobility. I think she may benefit from valve in valve AVR. Given advanced age,  she is not a good candidate for re-do conventional AVR by surgical approach. I think she may be a good candidate for TAVR. Further workup for potential TAVR would include a cardiac cath as the next step. Her anemic is improved post transfusion.    STS Risk Score:  Risk of Mortality: 9.560% Renal Failure: 6.235% Permanent Stroke: 5.172% Prolonged Ventilation: 22.412% DSW Infection: 0.097% Reoperation: 5.025% Morbidity or Mortality: 29.022% Short Length of Stay: 10.159% Long Length of Stay: 18.731%  I have reviewed the natural history of aortic stenosis with the patient and their family members  who are present today. We have discussed the limitations of medical therapy and the poor prognosis associated with symptomatic aortic stenosis. We have reviewed potential treatment options, including palliative medical therapy, conventional surgical aortic valve replacement, and transcatheter aortic  valve replacement. We discussed treatment options in the context of the patient's specific comorbid medical conditions.   Will follow along while her anemia is evaluated. I think it is reasonable to assume that she may be a candidate for TAVR once her anemia is  improved. I would support the risk of GI workup with endoscopy even with severe underlying AS if the endoscopy is indicated. Her workup for TAVR could be as an outpatient. We will need to document stability of her Hgb prior to cardiac cath. If the care teams would like to discharge her home this weekend, we can plan outpatient evaluation of her AS. I have discussed this today with Dr. Butler Denmark by telephone.   We will attempt to get records from [facility] to include her operative report from 2014 and her cath report from prior to her AVR.   For questions or updates, please contact [facility] Please consult [email or web address] for contact info under   Signed, Verne Carrow, MD  03/15/2020 2:21 PM

## 2020-03-15 NOTE — Progress Notes (Signed)
Progress Note  Patient Name: Norberto Sorensonlizbeth Ragas Date of Encounter: 03/15/2020   [facility] Cardi[facility]: No primary care provider on file.   Subjective   Feeling better.  She was able to sleep better last night.  Complains of constipation.   Inpatient Medications    Scheduled Meds: . folic acid  1 mg Oral BID  . [START ON 03/16/2020] furosemide  80 mg Intravenous Daily  . levothyroxine  100 mcg Oral q morning - 10a  . meclizine  12.5 mg Oral TID  . metoprolol tartrate  12.5 mg Oral BID  . pantoprazole  40 mg Oral BID AC  . polyethylene glycol  17 g Oral Daily  . potassium chloride  40 mEq Oral BID  . sodium chloride flush  3 mL Intravenous Q12H   Continuous Infusions: . sodium chloride     PRN Meds: sodium chloride, acetaminophen, alum & mag hydroxide-simeth, meclizine, ondansetron (ZOFRAN) IV, sodium chloride flush   Vital Signs    Vitals:   03/14/20 2007 03/14/20 2300 03/15/20 0330 03/15/20 0939  BP: 98/62 111/63 102/66 103/69  Pulse: 88 89 85 87  Resp: 19  19   Temp:  98.6 F (37 C) 98.1 F (36.7 C) 98.3 F (36.8 C)  TempSrc:  Oral Oral Oral  SpO2: 99% 94% 98% 96%  Weight:   57.2 kg   Height:        Intake/Output Summary (Last 24 hours) at 03/15/2020 1008 Last data filed at 03/15/2020 0828 Gross per 24 hour  Intake 830 ml  Output 1195 ml  Net -365 ml   Last 3 Weights 03/15/2020 03/14/2020 03/13/2020  Weight (lbs) 126 lb 1.6 oz 127 lb 12.8 oz 132 lb 3.2 oz  Weight (kg) 57.199 kg 57.97 kg 59.966 kg      Telemetry    Sinus rhythm. - Personally Reviewed  ECG    n/a - Personally Reviewed  Physical Exam   VS:  BP 103/69 (BP Location: Right Arm)   Pulse 87   Temp 98.3 F (36.8 C) (Oral)   Resp 19   Ht 5\' 2"  (1.575 m)   Wt 57.2 kg   SpO2 96%   BMI 23.06 kg/m  , BMI Body mass index is 23.06 kg/m. GENERAL:  Well appearing HEENT: Pupils equal round and reactive, fundi not visualized, oral mucosa unremarkable NECK:  + jugular venous distention to upper  neck, waveform within normal limits, carotid upstroke brisk and symmetric, no bruits LUNGS:  Basilar crackles. HEART:  RRR.  PMI not displaced or sustained,S1 and S2 within normal limits, no S3, no S4, no clicks, no rubs, III/VI late-peaking systolic murmur ABD:  Flat, positive bowel sounds normal in frequency in pitch, no bruits, no rebound, no guarding, no midline pulsatile mass, no hepatomegaly, no splenomegaly EXT:  2 plus pulses throughout, trace edema, no cyanosis no clubbing SKIN:  No rashes no nodules NEURO:  Cranial nerves II through XII grossly intact, motor grossly intact throughout PSYCH:  Cognitively intact, oriented to person place and time  Labs    High Sensitivity Troponin:   Recent Labs  Lab 03/12/20 1749 03/13/20 0508 03/13/20 0719  TROPONINIHS 176* 169* 182*      Chemistry Recent Labs  Lab 03/12/20 1749 03/14/20 0437 03/15/20 0442  NA 140 142 140  K 3.5 3.3* 4.3  CL 106 104 101  CO2 24 26 29   GLUCOSE 122* 112* 107*  BUN 36* 28* 25*  CREATININE 1.31* 1.28* 1.32*  CALCIUM 8.5* 8.7* 8.7*  PROT 6.6  --   --   ALBUMIN 3.0*  --   --   AST 15  --   --   ALT 7  --   --   ALKPHOS 57  --   --   BILITOT 0.7  --   --   GFRNONAA 36* 37* 35*  GFRAA 41* 42* 41*  ANIONGAP 10 12 10      Hematology Recent Labs  Lab 03/12/20 1749 03/12/20 1749 03/13/20 0508 03/13/20 1526 03/15/20 0442  WBC 11.8*  --  9.4  --  8.5  RBC 2.56*   < > 2.40* 2.55* 3.10*  HGB 7.5*  --  7.1*  --  8.8*  HCT 25.0*  --  23.6*  --  28.9*  MCV 97.7  --  98.3  --  93.2  MCH 29.3  --  29.6  --  28.4  MCHC 30.0  --  30.1  --  30.4  RDW 16.3*  --  15.9*  --  17.8*  PLT 230  --  214  --  267   < > = values in this interval not displayed.    BNP Recent Labs  Lab 03/12/20 1749  BNP 3,112.4*     DDimer No results for input(s): DDIMER in the last 168 hours.   Radiology    ECHOCARDIOGRAM COMPLETE  Result Date: 03/13/2020    ECHOCARDIOGRAM REPORT   Patient Name:   SYNTHIA FAIRBANK  Date of Exam: 03/13/2020 Medical Rec #:  05/13/2020      Height:       62.0 in Accession #:    [id number]     Weight:       132.2 lb Date of Birth:  [birth year]       BSA:          1.603 m Patient Age:    89 years       BP:           101/61 mmHg Patient Gender: F              HR:           88 bpm. Exam Location:  Inpatient Procedure: 2D Echo Indications:    acute diastolic CHF 428.31  History:        Patient has no prior history of Echocardiogram examinations.                 Risk Factors:Hypertension.  Sonographer:    03/17/1927 Referring Phys: [phone] JARED M GARDNER IMPRESSIONS  1. Left ventricular ejection fraction, by estimation, is 40 to 45%. The left ventricle has mildly decreased function. The left ventricle demonstrates global hypokinesis. There is mild concentric left ventricular hypertrophy. Left ventricular diastolic function could not be evaluated.  2. Right ventricular systolic function is mildly reduced. The right ventricular size is mildly enlarged. There is normal pulmonary artery systolic pressure.  3. Left atrial size was severely dilated.  4. Right atrial size was moderately dilated.  5. There is severe thickening and calcification of the subvalvular mitral apparatus. There is severe annular calcification. The mitral valve has both degenerative and rheumatic features. Moderate mitral valve regurgitation. Mild to moderate mitral stenosis. The mean mitral valve gradient is 6.0 mmHg with average heart rate of 88 bpm.  6. The aortic valve is tricuspid. Aortic valve regurgitation is mild to moderate. Severe aortic valve stenosis. Aortic valve Vmax measures 4.38 m/s. FINDINGS  Left Ventricle: Left ventricular ejection fraction, by estimation, is 40  to 45%. The left ventricle has mildly decreased function. The left ventricle demonstrates global hypokinesis. The left ventricular internal cavity size was normal in size. There is  mild concentric left ventricular hypertrophy. Left ventricular diastolic  function could not be evaluated due to mitral stenosis. Left ventricular diastolic function could not be evaluated. Right Ventricle: The right ventricular size is mildly enlarged. No increase in right ventricular wall thickness. Right ventricular systolic function is mildly reduced. There is normal pulmonary artery systolic pressure. The tricuspid regurgitant velocity  is 2.50 m/s, and with an assumed right atrial pressure of 8 mmHg, the estimated right ventricular systolic pressure is 33.0 mmHg. Left Atrium: Left atrial size was severely dilated. Right Atrium: Right atrial size was moderately dilated. Pericardium: There is no evidence of pericardial effusion. Mitral Valve: There is severe thickening and calcification of the subvalvular apparatus. The mitral valve is degenerative in appearance. There is moderate thickening of the mitral valve leaflet(s). There is moderate calcification of the mitral valve leaflet(s). Severe mitral annular calcification. Moderate mitral valve regurgitation, with centrally-directed jet. Mild to moderate mitral valve stenosis. MV peak gradient, 15.1 mmHg. The mean mitral valve gradient is 6.0 mmHg with average heart rate of 88 bpm. Tricuspid Valve: The tricuspid valve is normal in structure. Tricuspid valve regurgitation is trivial. Aortic Valve: The aortic valve is tricuspid. . There is severe thickening and severe calcifcation of the aortic valve. Aortic valve regurgitation is mild to moderate. Aortic regurgitation PHT measures 304 msec. Severe aortic stenosis is present. There is  severe thickening of the aortic valve. There is severe calcifcation of the aortic valve. Aortic valve mean gradient measures 50.5 mmHg. Aortic valve peak gradient measures 76.7 mmHg. Aortic valve area, by VTI measures 0.52 cm. Pulmonic Valve: The pulmonic valve was normal in structure. Pulmonic valve regurgitation is mild. Aorta: The aortic root and ascending aorta are structurally normal, with no  evidence of dilitation. IAS/Shunts: No atrial level shunt detected by color flow Doppler.  LEFT VENTRICLE PLAX 2D LVIDd:         5.10 cm  Diastology LVIDs:         4.10 cm  LV e' lateral:   6.73 cm/s LV PW:         1.20 cm  LV E/e' lateral: 25.4 LV IVS:        1.30 cm  LV e' medial:    3.99 cm/s LVOT diam:     1.70 cm  LV E/e' medial:  42.9 LV SV:         54 LV SV Index:   34 LVOT Area:     2.27 cm  RIGHT VENTRICLE            IVC RV Basal diam:  4.66 cm    IVC diam: 2.50 cm RV S prime:     9.89 cm/s TAPSE (M-mode): 1.1 cm LEFT ATRIUM              Index       RIGHT ATRIUM           Index LA diam:        5.50 cm  3.43 cm/m  RA Area:     15.10 cm LA Vol (A2C):   114.0 ml 71.11 ml/m RA Volume:   37.20 ml  23.20 ml/m LA Vol (A4C):   65.9 ml  41.10 ml/m LA Biplane Vol: 86.2 ml  53.77 ml/m  AORTIC VALVE AV Area (Vmax):    0.48 cm AV Area (  Vmean):   0.48 cm AV Area (VTI):     0.52 cm AV Vmax:           438.00 cm/s AV Vmean:          340.500 cm/s AV VTI:            1.038 m AV Peak Grad:      76.7 mmHg AV Mean Grad:      50.5 mmHg LVOT Vmax:         92.75 cm/s LVOT Vmean:        72.700 cm/s LVOT VTI:          0.237 m LVOT/AV VTI ratio: 0.23 AI PHT:            304 msec  AORTA Ao Root diam: 3.10 cm Ao Asc diam:  3.60 cm MITRAL VALVE                TRICUSPID VALVE MV Area (PHT): 4.21 cm     TR Peak grad:   25.0 mmHg MV Peak grad:  15.1 mmHg    TR Vmax:        250.00 cm/s MV Mean grad:  6.0 mmHg MV Vmax:       1.94 m/s     SHUNTS MV Vmean:      112.0 cm/s   Systemic VTI:  0.24 m MV Decel Time: 180 msec     Systemic Diam: 1.70 cm MR Peak grad: 159.3 mmHg MR Mean grad: 109.0 mmHg MR Vmax:      631.00 cm/s MR Vmean:     501.0 cm/s MV E velocity: 171.00 cm/s MV A velocity: 119.00 cm/s MV E/A ratio:  1.44 Mihai Croitoru MD Electronically signed by Thurmon Fair MD Signature Date/Time: 03/13/2020/6:03:07 PM    Final     Cardiac Studies   Echo 03/13/20: 1. Left ventricular ejection fraction, by estimation, is 40 to 45%.  The  left ventricle has mildly decreased function. The left ventricle  demonstrates global hypokinesis. There is mild concentric left ventricular  hypertrophy. Left ventricular diastolic  function could not be evaluated.  2. Right ventricular systolic function is mildly reduced. The right  ventricular size is mildly enlarged. There is normal pulmonary artery  systolic pressure.  3. Left atrial size was severely dilated.  4. Right atrial size was moderately dilated.  5. There is severe thickening and calcification of the subvalvular mitral  apparatus. There is severe annular calcification. The mitral valve has  both degenerative and rheumatic features. Moderate mitral valve  regurgitation. Mild to moderate mitral  stenosis. The mean mitral valve gradient is 6.0 mmHg with average heart  rate of 88 bpm.  6. The aortic valve is tricuspid. Aortic valve regurgitation is mild to  moderate. Severe aortic valve stenosis. Aortic valve Vmax measures 4.38  m/s.   Patient Profile     86 y.o. female 52F with hypertension, aortic stenosis status post aortic valve replacement, SVT, and vertigo admitted with severe prosthetic aortic valve stenosis and acute systolic diastolic heart failure and acute on chronic heart failure.  Assessment & Plan    #Severe prosthetic valve aortic stenosis: # Mitral stenosis: Ms. Spadoni bioprosthetic aortic valve.  She has degenerative mitral valve disease with mild to moderate mitral stenosis and a mean gradient of 6 mmHg and heart rate of 88.  She also has severe prosthetic aortic valve stenosis with a mean gradient of 50.  She has not seen cardiology since her valve was replaced.  The aortic valve Doppler tracing was rounded and the dimensionless valve index was 0.2.  Findings are consistent with true prosthetic valve stenosis rather than patient prosthesis mismatch, high flow, or other etiologies.  The leaflets also look very thickened and calcified.  She does not  have the valve replaced her 1 year prognosis is very poor.  However she would not be a candidate for TAVR unless her blood counts remained stable.  We will ask our structural heart team to see her and determine whether she would be a potential candidate for TAVR.  If she is not, would not push for more endoscopies.  If she is, the risk of sedation for upper and lower endoscopy merits the potential benefit.  # Acute systolic and diastolic heart failure: Admission BNP was 3000.  LVEF 40 to 45% with global hypokinesis and mild LVH.  Her echo was personally reviewed.    She was only net -700 mL yesterday.  We will increase Lasix to 80 mg IV today.  # Acute on chronic anemia:  Admission hbg 7.1.  It has increased to 8.8 this AM after transfusion.  # Demand ischemia:  High-sensitivity troponin was elevated to 182.  She has no ischemic symptoms at this time.  I suspect this is demand ischemia in the setting of volume overload.  Diuresis as above.  If she is a candidate for TAVR she will need a cath at that time.  Anemia must be resolved first.  Resume aspirin when able.       For questions or updates, please contact [facility] Please consult [email or web address] for contact info under        Signed, Chilton Si, MD  03/15/2020, 10:08 AM

## 2020-03-15 NOTE — Progress Notes (Signed)
PROGRESS NOTE    Morgan Holland   ZOX:[id number]  DOB: Mar 12, 1927  DOA: 03/12/2020 PCP: Ardyth Gal, MD   Brief Narrative:  Morgan Holland is a 86 year old female with a history of hypertension, hypothyroidism, prosthetic aortic valve and gastroesophageal reflux disease with chronic GI bleeding and iron deficiency anemia.  She has been "feeling bad" for about 4 days with complaints of fatigue increased sleeping, generalized weakness, dizziness, nausea and decrease in appetite.  This was associated with shortness of breath as well.  In the ED: Found to have congestive heart failure with crackles on exam, elevated BNP and CT scan suggestive of pulmonary edema.  Started on Lasix. She was also found to have a hemoglobin of 7.5.   Subjective: She feels much better today. Feeling stronger. In good spirits. No complaints.     Assessment & Plan:   Principal Problem:   New onset of systolic congestive heart failure-severe aortic stenosis and moderate mitral regurgitation - h/o MI in the past per the patient - she states she had an MI about 4-5 yrs ago and received a pig valve at that time- she is not aware of any stenting  -He continues to have crackles on exam-we will continue IV Lasix- increased to 80 mg daily today -2D echo reveals that she has an EF of 40 to 45% and global hypokinesis-there is bilateral atrial dilatation and significant calcification of the mitral valve with moderate mitral regurg and severe aortic valve stenosis - cardiology eval requested for above- Dr Duke Salvia is requesting an eval by the TAVR team today  Active Problems:  Mild AKI -Creatinine has risen slightly to 1.28-continue to follow with diuresis  Hypokalemia - Due to Lasix-replace-follow-up on magnesium level    Chronic iron deficiency and blood loss anemia- -history of chronic GI bleeding Iron deficiency, folate deficiency, B12 deficiency -Hemoglobin is 7.1 -I have reviewed a progress note from her  primary care doctor's office from 12/01/2019 which mentioned that her hemoglobin was 11.4 at that time -In regards to her iron deficiency anemia, no further work-up has been recommended by her gastroenterologist- last EGD and colonoscopy were in 2018- she was found to have a hiatal hernia, erythema and congestion in the antrum, internal hemorrhoids, diverticulosis and a polyp of 4 mm -of note she is on aspirin 81 mg twice a day as outpatient (she states she had an MI 3-4 yrs ago) which is on hold during this hospital stay -Iron levels are quite low despite taking oral iron supplements twice daily as outpatient Plan: - Will give her 1 unit of packed red blood cells as she remains quite weak -I will also give her IV iron-Feraheme 550 mg -She also has folate deficiency as her folate level is 4.2-I have started replacing this as well - She also has a low B12 level at 242 which I am replacing    Iron  13   UIBC  285   TIBC  298   Saturation Ratios  4   Ferritin  20   Folate  4.2     Vitamin B12  242    Gastro esophageal reflux disease -Continue PPI BID  Hypertension -She takes hydralazine 10 mg as outpatient however her blood pressures have been less than 120/80 and this is on hold  Hypothyroidism -TSH is 0.557-in the past it was in the 80s and 90s  Hyperlipidemia -She is on a atorvastatin 40 mg daily  BPPV -Uses as needed meclizine for this-currently no symptoms  Very hard  of hearing   Time spent in minutes: 40 minutes DVT prophylaxis: SCDs Code Status: Full code Family Communication:  Disposition Plan:  Status is: Inpatient  Remains inpatient appropriate because:Needs ongoing IV diuretics and a cardiology eval for TAVR   Dispo: The patient is from: Home              Anticipated d/c is to: Home              Anticipated d/c date is: 2 days              Patient currently is not medically stable to d/c.   Consultants:   Cardiology Procedures:   2D echo 1. Left  ventricular ejection fraction, by estimation, is 40 to 45%. The  left ventricle has mildly decreased function. The left ventricle  demonstrates global hypokinesis. There is mild concentric left ventricular  hypertrophy. Left ventricular diastolic  function could not be evaluated.  2. Right ventricular systolic function is mildly reduced. The right  ventricular size is mildly enlarged. There is normal pulmonary artery  systolic pressure.  3. Left atrial size was severely dilated.  4. Right atrial size was moderately dilated.  5. There is severe thickening and calcification of the subvalvular mitral  apparatus. There is severe annular calcification. The mitral valve has  both degenerative and rheumatic features. Moderate mitral valve  regurgitation. Mild to moderate mitral  stenosis. The mean mitral valve gradient is 6.0 mmHg with average heart  rate of 88 bpm.  6. The aortic valve is tricuspid. Aortic valve regurgitation is mild to  moderate. Severe aortic valve stenosis. Aortic valve Vmax measures 4.38  m/s.  Antimicrobials:  Anti-infectives (From admission, onward)   None       Objective: Vitals:   03/14/20 2007 03/14/20 2300 03/15/20 0330 03/15/20 0939  BP: 98/62 111/63 102/66 103/69  Pulse: 88 89 85 87  Resp: 19  19   Temp:  98.6 F (37 C) 98.1 F (36.7 C) 98.3 F (36.8 C)  TempSrc:  Oral Oral Oral  SpO2: 99% 94% 98% 96%  Weight:   57.2 kg   Height:        Intake/Output Summary (Last 24 hours) at 03/15/2020 1010 Last data filed at 03/15/2020 0828 Gross per 24 hour  Intake 830 ml  Output 1195 ml  Net -365 ml   Filed Weights   03/13/20 0100 03/14/20 0338 03/15/20 0330  Weight: 60 kg 58 kg 57.2 kg    Examination: General exam: Appears comfortable  HEENT: PERRLA, oral mucosa moist, no sclera icterus or thrush Respiratory system: Clear to auscultation. Respiratory effort normal. Cardiovascular system: S1 & S2 heard,  No murmurs  Gastrointestinal system:  Abdomen soft, non-tender, nondistended. Normal bowel sounds   Central nervous system: Alert and oriented. No focal neurological deficits. Extremities: No cyanosis, clubbing or edema Skin: No rashes or ulcers Psychiatry:  Mood & affect appropriate.    Data Reviewed: I have personally reviewed following labs and imaging studies  CBC: Recent Labs  Lab 03/12/20 1749 03/13/20 0508 03/15/20 0442  WBC 11.8* 9.4 8.5  NEUTROABS 8.5*  --   --   HGB 7.5* 7.1* 8.8*  HCT 25.0* 23.6* 28.9*  MCV 97.7 98.3 93.2  PLT 230 214 267   Basic Metabolic Panel: Recent Labs  Lab 03/12/20 1749 03/14/20 0437 03/15/20 0442  NA 140 142 140  K 3.5 3.3* 4.3  CL 106 104 101  CO2 GLUCOSE 122* 112* 107*  BUN 36* 28* 25*  CREATININE 1.31* 1.28* 1.32*  CALCIUM 8.5* 8.7* 8.7*   GFR: Estimated Creatinine Clearance: 22 mL/min (A) (by C-G formula based on SCr of 1.32 mg/dL (H)). Liver Function Tests: Recent Labs  Lab 03/12/20 1749  AST 15  ALT 7  ALKPHOS 57  BILITOT 0.7  PROT 6.6  ALBUMIN 3.0*   No results for input(s): LIPASE, AMYLASE in the last 168 hours. No results for input(s): AMMONIA in the last 168 hours. Coagulation Profile: No results for input(s): INR, PROTIME in the last 168 hours. Cardiac Enzymes: No results for input(s): CKTOTAL, CKMB, CKMBINDEX, TROPONINI in the last 168 hours. BNP (last 3 results) No results for input(s): PROBNP in the last 8760 hours. HbA1C: Recent Labs    03/14/20 1436  HGBA1C 4.8   CBG: No results for input(s): GLUCAP in the last 168 hours. Lipid Profile: Recent Labs    03/15/20 0442  CHOL 139  HDL 30*  LDLCALC 75  TRIG 295168*  CHOLHDL 4.6   Thyroid Function Tests: Recent Labs    03/13/20 0508  TSH 0.557   Anemia Panel: Recent Labs    03/13/20 1526  VITAMINB12 242  FOLATE 4.2*  FERRITIN 20  TIBC 298  IRON 13*  RETICCTPCT 5.9*   Urine analysis:    Component Value Date/Time   COLORURINE YELLOW 03/13/2020 0001    APPEARANCEUR CLEAR 03/13/2020 0001   LABSPEC 1.010 03/13/2020 0001   PHURINE 5.0 03/13/2020 0001   GLUCOSEU NEGATIVE 03/13/2020 0001   HGBUR NEGATIVE 03/13/2020 0001   BILIRUBINUR NEGATIVE 03/13/2020 0001   KETONESUR NEGATIVE 03/13/2020 0001   PROTEINUR NEGATIVE 03/13/2020 0001   NITRITE NEGATIVE 03/13/2020 0001   LEUKOCYTESUR SMALL (A) 03/13/2020 0001   Sepsis Labs: @LABRCNTIP (procalcitonin:4,lacticidven:4) ) Recent Results (from the past 240 hour(s))  Culture, blood (routine x 2)     Status: None (Preliminary result)   Collection Time: 03/12/20  5:50 PM   Specimen: BLOOD  Result Value Ref Range Status   Specimen Description   Final    BLOOD RIGHT ANTECUBITAL Performed at [facility] [place], [address] [place], [place] [address]    Special Requests   Final    BOTTLES DRAWN AEROBIC AND ANAEROBIC Blood Culture adequate volume Performed at [facility] [place], [address] [place], [place] [address]    Culture   Final    NO GROWTH 3 DAYS Performed at [facility], [address] [place], [place] [address]    Report Status PENDING  Incomplete  SARS Coronavirus 2 by RT PCR (hospital order, performed in [facility] hospital lab) Nasopharyngeal Peripheral     Status: None   Collection Time: 03/12/20  5:52 PM   Specimen: Peripheral; Nasopharyngeal  Result Value Ref Range Status   SARS Coronavirus 2 NEGATIVE NEGATIVE Final    Comment: (NOTE) SARS-CoV-2 target nucleic acids are NOT DETECTED.  The SARS-CoV-2 RNA is generally detectable in upper and lower respiratory specimens during the acute phase of infection. The lowest concentration of SARS-CoV-2 viral copies this assay can detect is 250 copies / mL. A negative result does not preclude SARS-CoV-2 infection and should not be used as the sole basis for treatment or other patient management decisions.  A negative result may occur with improper specimen collection / handling, submission of specimen  other than nasopharyngeal swab, presence of viral mutation(s) within the areas targeted by this assay, and inadequate number of viral copies (<250 copies / mL). A negative  result must be combined with clinical observations, patient history, and epidemiological information.  Fact Sheet for Patients:   BoilerBrush.com.cy  Fact Sheet for Healthcare Providers: https://pope.com/  This test is not yet approved or  cleared by the [place] FDA and has been authorized for detection and/or diagnosis of SARS-CoV-2 by FDA under an Emergency Use Authorization (EUA).  This EUA will remain in effect (meaning this test can be used) for the duration of the COVID-19 declaration under Section 564(b)(1) of the Act, 21 U.S.C. section 360bbb-3(b)(1), unless the authorization is terminated or revoked sooner.  Performed at [facility], [address] [place], [place] [address]   Culture, blood (routine x 2)     Status: None (Preliminary result)   Collection Time: 03/12/20  6:07 PM   Specimen: BLOOD  Result Value Ref Range Status   Specimen Description   Final    BLOOD LEFT ANTECUBITAL Performed at [facility], [address] [place], [place] [address]    Special Requests   Final    BOTTLES DRAWN AEROBIC AND ANAEROBIC Blood Culture adequate volume Performed at [facility], [address] [place], [place] [address]    Culture   Final    NO GROWTH 3 DAYS Performed at [facility], [address], [place], [place] [address]    Report Status PENDING  Incomplete  Urine culture     Status: Abnormal   Collection Time: 03/12/20 11:54 PM   Specimen: Urine, Random  Result Value Ref Range Status   Specimen Description   Final    URINE, RANDOM Performed at [facility], [address] [place], [place] [address]    Special Requests   Final    NONE Performed at [facility], [address] [place], [place] [address]    Culture MULTIPLE SPECIES PRESENT, SUGGEST RECOLLECTION (A)  Final   Report Status 03/13/2020 FINAL  Final         Radiology Studies: ECHOCARDIOGRAM COMPLETE  Result Date: 03/13/2020    ECHOCARDIOGRAM REPORT   Patient Name:   URIYAH MASSIMO Date of Exam: 03/13/2020 Medical Rec #:  [id number]      Height:       62.0 in Accession #:    [id number]     Weight:       132.2 lb Date of Birth:  19-Sep-1926       BSA:          1.603 m Patient Age:    89 years       BP:           101/61 mmHg Patient Gender: F              HR:           88 bpm. Exam Location:  Inpatient Procedure: 2D Echo Indications:    acute diastolic CHF 428.31  History:        Patient has no prior history of Echocardiogram examinations.                 Risk Factors:Hypertension.  Sonographer:    Delcie Roch Referring Phys: [phone] JARED M GARDNER IMPRESSIONS  1. Left ventricular ejection fraction, by estimation, is 40 to 45%. The left ventricle has mildly decreased function. The left ventricle demonstrates global hypokinesis. There is mild concentric left ventricular hypertrophy. Left ventricular diastolic function could not be evaluated.  2. Right ventricular systolic  function is mildly reduced. The right ventricular size is mildly enlarged. There is normal pulmonary artery systolic pressure.  3. Left atrial size was severely dilated.  4. Right atrial size was moderately dilated.  5. There is severe thickening and calcification of the subvalvular mitral apparatus. There is severe annular calcification. The mitral valve has both degenerative and rheumatic features. Moderate mitral valve regurgitation. Mild to moderate mitral stenosis. The mean mitral valve gradient is 6.0 mmHg with average heart rate of 88 bpm.  6. The aortic valve is tricuspid. Aortic valve regurgitation is mild to moderate. Severe aortic valve stenosis. Aortic valve Vmax measures 4.38 m/s. FINDINGS  Left Ventricle: Left ventricular ejection  fraction, by estimation, is 40 to 45%. The left ventricle has mildly decreased function. The left ventricle demonstrates global hypokinesis. The left ventricular internal cavity size was normal in size. There is  mild concentric left ventricular hypertrophy. Left ventricular diastolic function could not be evaluated due to mitral stenosis. Left ventricular diastolic function could not be evaluated. Right Ventricle: The right ventricular size is mildly enlarged. No increase in right ventricular wall thickness. Right ventricular systolic function is mildly reduced. There is normal pulmonary artery systolic pressure. The tricuspid regurgitant velocity  is 2.50 m/s, and with an assumed right atrial pressure of 8 mmHg, the estimated right ventricular systolic pressure is 33.0 mmHg. Left Atrium: Left atrial size was severely dilated. Right Atrium: Right atrial size was moderately dilated. Pericardium: There is no evidence of pericardial effusion. Mitral Valve: There is severe thickening and calcification of the subvalvular apparatus. The mitral valve is degenerative in appearance. There is moderate thickening of the mitral valve leaflet(s). There is moderate calcification of the mitral valve leaflet(s). Severe mitral annular calcification. Moderate mitral valve regurgitation, with centrally-directed jet. Mild to moderate mitral valve stenosis. MV peak gradient, 15.1 mmHg. The mean mitral valve gradient is 6.0 mmHg with average heart rate of 88 bpm. Tricuspid Valve: The tricuspid valve is normal in structure. Tricuspid valve regurgitation is trivial. Aortic Valve: The aortic valve is tricuspid. . There is severe thickening and severe calcifcation of the aortic valve. Aortic valve regurgitation is mild to moderate. Aortic regurgitation PHT measures 304 msec. Severe aortic stenosis is present. There is  severe thickening of the aortic valve. There is severe calcifcation of the aortic valve. Aortic valve mean gradient  measures 50.5 mmHg. Aortic valve peak gradient measures 76.7 mmHg. Aortic valve area, by VTI measures 0.52 cm. Pulmonic Valve: The pulmonic valve was normal in structure. Pulmonic valve regurgitation is mild. Aorta: The aortic root and ascending aorta are structurally normal, with no evidence of dilitation. IAS/Shunts: No atrial level shunt detected by color flow Doppler.  LEFT VENTRICLE PLAX 2D LVIDd:         5.10 cm  Diastology LVIDs:         4.10 cm  LV e' lateral:   6.73 cm/s LV PW:         1.20 cm  LV E/e' lateral: 25.4 LV IVS:        1.30 cm  LV e' medial:    3.99 cm/s LVOT diam:     1.70 cm  LV E/e' medial:  42.9 LV SV:         54 LV SV Index:   34 LVOT Area:     2.27 cm  RIGHT VENTRICLE            IVC RV Basal diam:  4.66 cm    IVC diam: 2.50  cm RV S prime:     9.89 cm/s TAPSE (M-mode): 1.1 cm LEFT ATRIUM              Index       RIGHT ATRIUM           Index LA diam:        5.50 cm  3.43 cm/m  RA Area:     15.10 cm LA Vol (A2C):   114.0 ml 71.11 ml/m RA Volume:   37.20 ml  23.20 ml/m LA Vol (A4C):   65.9 ml  41.10 ml/m LA Biplane Vol: 86.2 ml  53.77 ml/m  AORTIC VALVE AV Area (Vmax):    0.48 cm AV Area (Vmean):   0.48 cm AV Area (VTI):     0.52 cm AV Vmax:           438.00 cm/s AV Vmean:          340.500 cm/s AV VTI:            1.038 m AV Peak Grad:      76.7 mmHg AV Mean Grad:      50.5 mmHg LVOT Vmax:         92.75 cm/s LVOT Vmean:        72.700 cm/s LVOT VTI:          0.237 m LVOT/AV VTI ratio: 0.23 AI PHT:            304 msec  AORTA Ao Root diam: 3.10 cm Ao Asc diam:  3.60 cm MITRAL VALVE                TRICUSPID VALVE MV Area (PHT): 4.21 cm     TR Peak grad:   25.0 mmHg MV Peak grad:  15.1 mmHg    TR Vmax:        250.00 cm/s MV Mean grad:  6.0 mmHg MV Vmax:       1.94 m/s     SHUNTS MV Vmean:      112.0 cm/s   Systemic VTI:  0.24 m MV Decel Time: 180 msec     Systemic Diam: 1.70 cm MR Peak grad: 159.3 mmHg MR Mean grad: 109.0 mmHg MR Vmax:      631.00 cm/s MR Vmean:     501.0 cm/s MV E  velocity: 171.00 cm/s MV A velocity: 119.00 cm/s MV E/A ratio:  1.44 Mihai Croitoru MD Electronically signed by Thurmon Fair MD Signature Date/Time: 03/13/2020/6:03:07 PM    Final       Scheduled Meds:  folic acid  1 mg Oral BID   [START ON 03/16/2020] furosemide  80 mg Intravenous Daily   levothyroxine  100 mcg Oral q morning - 10a   meclizine  12.5 mg Oral TID   metoprolol tartrate  12.5 mg Oral BID   pantoprazole  40 mg Oral BID AC   polyethylene glycol  17 g Oral Daily   sodium chloride flush  3 mL Intravenous Q12H   Continuous Infusions:  sodium chloride       LOS: 2 days      Calvert Cantor, MD [facility] Pager: [email or web address] 03/15/2020, 10:10 AM

## 2020-03-16 DIAGNOSIS — D509 Iron deficiency anemia, unspecified: Secondary | ICD-10-CM

## 2020-03-16 DIAGNOSIS — I080 Rheumatic disorders of both mitral and aortic valves: Secondary | ICD-10-CM

## 2020-03-16 DIAGNOSIS — D529 Folate deficiency anemia, unspecified: Secondary | ICD-10-CM

## 2020-03-16 LAB — CBC
HCT: 32 % — ABNORMAL LOW (ref 36.0–46.0)
Hemoglobin: 9.7 g/dL — ABNORMAL LOW (ref 12.0–15.0)
MCH: 28.7 pg (ref 26.0–34.0)
MCHC: 30.3 g/dL (ref 30.0–36.0)
MCV: 94.7 fL (ref 80.0–100.0)
Platelets: 326 10*3/uL (ref 150–400)
RBC: 3.38 MIL/uL — ABNORMAL LOW (ref 3.87–5.11)
RDW: 17.1 % — ABNORMAL HIGH (ref 11.5–15.5)
WBC: 10.3 10*3/uL (ref 4.0–10.5)
nRBC: 0.2 % (ref 0.0–0.2)

## 2020-03-16 LAB — BASIC METABOLIC PANEL
Anion gap: 10 (ref 5–15)
BUN: 24 mg/dL — ABNORMAL HIGH (ref 8–23)
CO2: 25 mmol/L (ref 22–32)
Calcium: 8.7 mg/dL — ABNORMAL LOW (ref 8.9–10.3)
Chloride: 99 mmol/L (ref 98–111)
Creatinine, Ser: 1.51 mg/dL — ABNORMAL HIGH (ref 0.44–1.00)
GFR calc Af Amer: 35 mL/min — ABNORMAL LOW (ref >60)
GFR calc non Af Amer: 30 mL/min — ABNORMAL LOW (ref >60)
Glucose, Bld: 130 mg/dL — ABNORMAL HIGH (ref 70–99)
Potassium: 3.9 mmol/L (ref 3.5–5.1)
Sodium: 134 mmol/L — ABNORMAL LOW (ref 135–145)

## 2020-03-16 MED ORDER — POTASSIUM CHLORIDE ER 10 MEQ PO TBCR: 20.0000 meq | EXTENDED_RELEASE_TABLET | Freq: Every day | ORAL | 0 refills | Status: DC

## 2020-03-16 MED ORDER — METOPROLOL TARTRATE 25 MG PO TABS: 12.5000 mg | ORAL_TABLET | Freq: Two times a day (BID) | ORAL | 0 refills | Status: AC

## 2020-03-16 MED ORDER — CYANOCOBALAMIN 1000 MCG PO TABS: 1000.0000 ug | ORAL_TABLET | Freq: Every day | ORAL | 0 refills | Status: DC

## 2020-03-16 MED ORDER — FUROSEMIDE 40 MG PO TABS: 40.0000 mg | ORAL_TABLET | Freq: Every day | ORAL | 11 refills | Status: DC

## 2020-03-16 MED ORDER — FOLIC ACID 1 MG PO TABS
1.0000 mg | ORAL_TABLET | Freq: Every day | ORAL | 0 refills | Status: DC
Start: 2020-03-16 — End: 2020-03-23

## 2020-03-16 MED ORDER — POLYETHYLENE GLYCOL 3350 17 G PO PACK: 17.0000 g | PACK | Freq: Every day | ORAL | 0 refills | Status: DC

## 2020-03-16 NOTE — Discharge Instructions (Signed)
You were cared for by a hospitalist during your hospital stay. If you have any questions about your discharge medications or the care you received while you were in the hospital after you are discharged, you can call the unit and asked to speak with the hospitalist on call if the hospitalist that took care of you is not available. Once you are discharged, your primary care physician will handle any further medical issues.   Please note that NO REFILLS for any discharge medications will be authorized once you are discharged, as it is imperative that you return to your primary care physician (or establish a relationship with a primary care physician if you do not have one) for your aftercare needs so that they can reassess your need for medications and monitor your lab values.  Please take all your medications with you for your next visit with your Primary MD. Please ask your Primary MD to get all Hospital records sent to his/her office. Please request your Primary MD to go over all hospital test results at the follow up.   If you experience worsening of your admission symptoms, develop shortness of breath, chest pain, suicidal or homicidal thoughts or a life threatening emergency, you must seek medical attention immediately by calling 911 or calling your MD.   Morgan Holland must read the complete instructions/literature along with all the possible adverse reactions/side effects for all the medicines you take including new medications that have been prescribed to you. Take new medicines after you have completely understood and accpet all the possible adverse reactions/side effects.    Do not drive when taking pain medications or sedatives.     Do not take more than prescribed Pain, Sleep and Anxiety Medications   If you have smoked or chewed Tobacco in the last 2 yrs please stop. Stop any regular alcohol  and or recreational drug use.   Wear Seat belts while driving.    Heart Failure, Self Care Heart  failure is a serious condition. This sheet explains things you need to do to take care of yourself at home. To help you stay as healthy as possible, you may be asked to change your diet, take certain medicines, and make other changes in your life. Your doctor may also give you more specific instructions. If you have problems or questions, call your doctor. What are the risks? Having heart failure makes it more likely for you to have some problems. These problems can get worse if you do not take good care of yourself. Problems may include:  Blood clotting problems. This may cause a stroke.  Damage to the kidneys, liver, or lungs.  Abnormal heart rhythms. Supplies needed:  Scale for weighing yourself.  Blood pressure monitor.  Notebook.  Medicines. How to care for yourself when you have heart failure Medicines Take over-the-counter and prescription medicines only as told by your doctor. Take your medicines every day.  Do not stop taking your medicine unless your doctor tells you to do so.  Do not skip any medicines.  Get your prescriptions refilled before you run out of medicine. This is important. Eating and drinking   Eat heart-healthy foods. Talk with a diet specialist (dietitian) to create an eating plan.  Choose foods that: ? Have no trans fat. ? Are low in saturated fat and cholesterol.  Choose healthy foods, such as: ? Fresh or frozen fruits and vegetables. ? Fish. ? Low-fat (lean) meats. ? Legumes, such as beans, peas, and lentils. ? Fat-free or low-fat  dairy products. ? Whole-grain foods. ? High-fiber foods.  Limit salt (sodium) if told by your doctor. Ask your diet specialist to tell you which seasonings are healthy for your heart.  Cook in healthy ways instead of frying. Healthy ways of cooking include roasting, grilling, broiling, baking, poaching, steaming, and stir-frying.  Limit how much fluid you drink, if told by your doctor. Alcohol use  Do not  drink alcohol if: ? Your doctor tells you not to drink. ? Your heart was damaged by alcohol, or you have very bad heart failure. ? You are pregnant, may be pregnant, or are planning to become pregnant.  If you drink alcohol: ? Limit how much you use to:  0-1 drink a day for women.  0-2 drinks a day for men. ? Be aware of how much alcohol is in your drink. In the [place] one drink equals one 12 oz bottle of beer (355 mL), one 5 oz glass of wine (148 mL), or one 1 oz glass of hard liquor (44 mL). Lifestyle   Do not use any products that contain nicotine or tobacco, such as cigarettes, e-cigarettes, and chewing tobacco. If you need help quitting, ask your doctor. ? Do not use nicotine gum or patches before talking to your doctor.  Do not use illegal drugs.  Lose weight if told by your doctor.  Do physical activity if told by your doctor. Talk to your doctor before you begin an exercise if: ? You are an older adult. ? You have very bad heart failure.  Learn to manage stress. If you need help, ask your doctor.  Get rehab (rehabilitation) to help you stay independent and to help with your quality of life.  Plan time to rest when you get tired. Check weight and blood pressure   Weigh yourself every day. This will help you to know if fluid is building up in your body. ? Weigh yourself every morning after you pee (urinate) and before you eat breakfast. ? Wear the same amount of clothing each time. ? Write down your daily weight. Give your record to your doctor.  Check and write down your blood pressure as told by your doctor.  Check your pulse as told by your doctor. Dealing with very hot and very cold weather  If it is very hot: ? Avoid activities that take a lot of energy. ? Use air conditioning or fans, or find a cooler place. ? Avoid caffeine and alcohol. ? Wear clothing that is loose-fitting, lightweight, and light-colored.  If it is very cold: ? Avoid activities that  take a lot of energy. ? Layer your clothes. ? Wear mittens or gloves, a hat, and a scarf when you go outside. ? Avoid alcohol. Follow these instructions at home:  Stay up to date with shots (vaccines). Get pneumococcal and flu (influenza) shots.  Keep all follow-up visits as told by your doctor. This is important. Contact a doctor if:  You gain weight quickly.  You have increasing shortness of breath.  You cannot do your normal activities.  You get tired easily.  You cough a lot.  You don't feel like eating or feel like you may vomit (nauseous).  You become puffy (swell) in your hands, feet, ankles, or belly (abdomen).  You cannot sleep well because it is hard to breathe.  You feel like your heart is beating fast (palpitations).  You get dizzy when you stand up. Get help right away if:  You have trouble breathing.  You or someone else notices a change in your behavior, such as having trouble staying awake.  You have chest pain or discomfort.  You pass out (faint). These symptoms may be an emergency. Do not wait to see if the symptoms will go away. Get medical help right away. Call your local emergency services (911 in the [place] Do not drive yourself to the hospital. Summary  Heart failure is a serious condition. To care for yourself, you may have to change your diet, take medicines, and make other lifestyle changes.  Take your medicines every day. Do not stop taking them unless your doctor tells you to do so.  Eat heart-healthy foods, such as fresh or frozen fruits and vegetables, fish, lean meats, legumes, fat-free or low-fat dairy products, and whole-grain or high-fiber foods.  Ask your doctor if you can drink alcohol. You may have to stop alcohol use if you have very bad heart failure.  Contact your doctor if you gain weight quickly or feel that your heart is beating too fast. Get help right away if you pass out, or have chest pain or trouble breathing. This  information is not intended to replace advice given to you by your health care provider. Make sure you discuss any questions you have with your health care provider. Document Revised: 10/11/2018 Document Reviewed: 10/12/2018 [employer] Patient Education  2020 [employer].

## 2020-03-16 NOTE — Discharge Summary (Signed)
Physician Discharge Summary  Morgan Holland NWG:[id number] DOB: 09/03/1926 DOA: 03/12/2020  PCP: Ardyth Gal, MD  Admit date: 03/12/2020 Discharge date: 03/16/2020  Admitted From: *home Disposition:  home   Recommendations for Outpatient Follow-up:  1. F/u on daily BP and daily weights- I have given her grand daughter, [name], extensive education 2. F/u Hb and stool hemoccults  Home Health:  RN ordered  Discharge Condition:  stable   CODE STATUS:  Full code   Diet recommendation:  Heart healthy Consultations:  cardiology  Procedures/Studies: . 2 D ECHO 1. Left ventricular ejection fraction, by estimation, is 40 to 45%. The  left ventricle has mildly decreased function. The left ventricle  demonstrates global hypokinesis. There is mild concentric left ventricular  hypertrophy. Left ventricular diastolic  function could not be evaluated.  2. Right ventricular systolic function is mildly reduced. The right  ventricular size is mildly enlarged. There is normal pulmonary artery  systolic pressure.  3. Left atrial size was severely dilated.  4. Right atrial size was moderately dilated.  5. There is severe thickening and calcification of the subvalvular mitral  apparatus. There is severe annular calcification. The mitral valve has  both degenerative and rheumatic features. Moderate mitral valve  regurgitation. Mild to moderate mitral  stenosis. The mean mitral valve gradient is 6.0 mmHg with average heart  rate of 88 bpm.  6. The aortic valve is tricuspid. Aortic valve regurgitation is mild to  moderate. Severe aortic valve stenosis. Aortic valve Vmax measures 4.38  m/s.   Discharge Diagnoses:  Principal Problem:   New onset of acute systolic congestive heart failure (HCC) Active Problems:    Chronic blood loss anemia   Chronic GI bleeding   Rheumatic mitral stenosis- Aortic valve replaced- Severe aortic stenosis   Hypertension    Brief Summary: Morgan Holland is a  86 year old female with a history of hypertension, hypothyroidism, prosthetic aortic valve and gastroesophageal reflux disease with chronic GI bleeding and iron deficiency anemia.  She has been "feeling bad" for about 4 days with complaints of fatigue increased sleeping, generalized weakness, dizziness, nausea and decrease in appetite.  This was associated with shortness of breath as well.  In the ED: Found to have congestive heart failure with crackles on exam, elevated BNP and CT scan suggestive of pulmonary edema.  Started on Lasix. She was also found to have a hemoglobin of 7.5.   Hospital Course:  Principal Problem:   New onset of systolic congestive heart failure-severe aortic stenosis (bioprosthetic valve) and moderate mitral regurgitation - h/o MI in the past per the patient - she states she had an MI about 4-5 yrs ago and received a pig valve at that time- she is not aware of any stenting  -He continues to have crackles on exam-we will continue IV Lasix- increased to 80 mg daily today -2D echo reveals that she has an EF of 40 to 45% and global hypokinesis-there is bilateral atrial dilatation and significant calcification of the mitral valve with moderate mitral regurg and severe aortic valve stenosis - cardiology eval requested for above - Primary cardiology team requested an evaluate from the TAVR team. Dr Clifton James feels she might be a good candidate for a TAVR and would like to do a cardiac cath-he feels that this can be done as an out patientas long as her Hb remains stable  Active Problems:   Chronic anemia multifactorial  Iron deficiency, Folate deficiency, B12 deficiency Possible history of chronic GI bleeding -Hemoglobin is 7.4> 7.1  -  I have reviewed a progress note from her primary care doctor's office from 12/01/2019 which mentioned that her hemoglobin was 11.4 at that time -In regards to her iron deficiency anemia, no further work-up has been recommended by her  gastroenterologist due to her age - her last EGD and colonoscopy were in 2018- she was found to have a hiatal hernia, erythema and congestion in the antrum, internal hemorrhoids, diverticulosis and a polyp of 4 mm -of note she is on aspirin 81 mg twice a day as outpatient (she states she had an MI 3-4 yrs ago)   -Iron levels are quite low despite taking oral iron supplements twice daily as outpatient - folate and B12 levels also low- see below Iron  13   UIBC  285   TIBC  298   Saturation Ratios  4   Ferritin  20   Folate  4.2        Vitamin B12  242     Plan: - given 1 unit of packed red blood cells for symptomatic anemia - Hb improved to 8.8 after transfusion and today is 9.7 -given IV iron >> Feraheme 550 mg  - Folate level is 4.2-I have started replacing this -   B12 level- 242 which I am replacing   - hopefully with all of the above measures, her Hb should improve - I did order stool hemoccult however it has not been collected- would recommend stool hemoccult be performed as outpatient    Mild AKI -Creatinine has risen slightly from 1.31 to 1.51 with diuretics- will hold IV diuresis today and allow her to d/c home - oral diuretics can start tomorrow  Hypokalemia - Due to Lasix - replaced    Gastro esophageal reflux disease -Continue PPI BID  Hypertension -She takes hydralazine 10 mg and Lisinopril 10 mg as outpatient however her blood pressures have been less than 120/80 and these are on hold - Metoprolol dose reduced from 50 BID to 12.5 BID due to low BP  Hypothyroidism -TSH is 0.557-in the past this level was in the 80s and 90s  Hyperlipidemia -She is on a atorvastatin 40 mg daily  BPPV -Uses as needed meclizine for this-currently no symptoms  Very hard of hearing     Discharge Exam: Vitals:   03/15/20 2037 03/16/20 0349  BP: 94/70 101/66  Pulse: 89 83  Resp: 19 18  Temp: 98.3 F (36.8 C) 98.1 F (36.7 C)  SpO2: 93% 92%    Vitals:   03/15/20 1129 03/15/20 1510 03/15/20 2037 03/16/20 0349  BP: 106/60 (!) 94/58 94/70 101/66  Pulse: 82 81 89 83  Resp:  Temp:  98.3 F (36.8 C) 98.3 F (36.8 C) 98.1 F (36.7 C)  TempSrc:  Oral Oral Oral  SpO2:  98% 93% 92%  Weight:    56.1 kg  Height:        General: Pt is alert, awake, not in acute distress- very hard of hearing Cardiovascular: RRR, S1/S2 +, + 2/6 aortic murmur no rubs, no gallops Respiratory: CTA bilaterally, no wheezing, no rhonchi Abdominal: Soft, NT, ND, bowel sounds + Extremities: no edema, no cyanosis   Discharge Instructions  Discharge Instructions    Diet - low sodium heart healthy   Complete by: As directed    Increase activity slowly   Complete by: As directed    No wound care   Complete by: As directed      Allergies as of 03/16/2020  No Known Allergies     Medication List    STOP taking these medications   hydrALAZINE 10 MG tablet Commonly known as: APRESOLINE   lisinopril 10 MG tablet Commonly known as: ZESTRIL     TAKE these medications   acetaminophen 325 MG tablet Commonly known as: TYLENOL Take 650 mg by mouth every 6 (six) hours as needed for headache (pain).   aspirin EC 81 MG tablet Take 81 mg by mouth 2 (two) times daily.   cyanocobalamin 1000 MCG tablet Take 1 tablet (1,000 mcg total) by mouth daily.   ferrous sulfate 325 (65 FE) MG EC tablet Take 1 tablet by mouth 2 (two) times daily.   folic acid 1 MG tablet Commonly known as: FOLVITE Take 1 tablet (1 mg total) by mouth daily.   furosemide 40 MG tablet Commonly known as: Lasix Take 1 tablet (40 mg total) by mouth daily.   levothyroxine 100 MCG tablet Commonly known as: SYNTHROID Take 100 mcg by mouth every morning.   meclizine 12.5 MG tablet Commonly known as: ANTIVERT Take 1 tablet (12.5 mg total) by mouth 3 (three) times daily as needed for dizziness.   metoprolol tartrate 25 MG tablet Commonly known as: LOPRESSOR Take 0.5  tablets (12.5 mg total) by mouth 2 (two) times daily. What changed:   medication strength  how much to take   multivitamin capsule Take 1 capsule by mouth daily.   omeprazole 40 MG capsule Commonly known as: PRILOSEC Take 40 mg by mouth 2 (two) times daily.   ondansetron 8 MG disintegrating tablet Commonly known as: ZOFRAN-ODT Take 8 mg by mouth every 12 (twelve) hours as needed for nausea.   polyethylene glycol 17 g packet Commonly known as: MIRALAX / GLYCOLAX Take 17 g by mouth daily.   potassium chloride 10 MEQ tablet Commonly known as: KLOR-CON Take 2 tablets (20 mEq total) by mouth daily.   triamcinolone cream 0.1 % Commonly known as: KENALOG Apply 1 application topically See admin instructions. Apply topically to chin daily as needed for itching/rash       Follow-up Information    [facility] Follow up.   Specialty: Home Health Services Why: [facility] Contact information: [address] [place] [address] [phone]        Ardyth Gal, MD.   Specialty: Internal Medicine Contact information: [address] [place] [address] [phone]              No Known Allergies    CT Angio Chest PE W/Cm &/Or Wo Cm  Result Date: 03/12/2020 CLINICAL DATA:  Weakness dizziness shortness of breath EXAM: CT ANGIOGRAPHY CHEST WITH CONTRAST TECHNIQUE: Multidetector CT imaging of the chest was performed using the standard protocol during bolus administration of intravenous contrast. Multiplanar CT image reconstructions and MIPs were obtained to evaluate the vascular anatomy. CONTRAST:  75mL OMNIPAQUE IOHEXOL 350 MG/ML SOLN COMPARISON:  None. FINDINGS: Cardiovascular: There is a optimal opacification of the pulmonary arteries. There is no central,segmental, or subsegmental filling defects within the pulmonary arteries. There is moderate cardiomegaly. Aortic valve and coronary artery calcifications are seen. Scattered aortic atherosclerosis is noted. No  pericardial effusion or thickening. No evidence right heart strain. There is normal three-vessel brachiocephalic anatomy without proximal stenosis. The thoracic aorta is normal in appearance. Mediastinum/Nodes: No hilar, mediastinal, or axillary adenopathy. Thyroid gland, trachea, are unremarkable. There is a moderate hiatal hernia with reflux of fluid in the distal esophagus. Lungs/Pleura: Hazy ground-glass opacity seen at  both lung bases with interlobular septal thickening. There is a small right and trace left pleural effusion present. No large airspace consolidation or pneumothorax. Upper Abdomen: No acute abnormalities present in the visualized portions of the upper abdomen. Musculoskeletal: No chest wall abnormality. No acute or significant osseous findings. Review of the MIP images confirms the above findings. IMPRESSION: No central, segmental, or subsegmental pulmonary embolism. Findings suggestive interstitial edema. Small right and trace left pleural effusion Aortic Atherosclerosis (ICD10-I70.0). Moderate hiatal hernia with gastroesophageal reflux Electronically Signed   By: Jonna Clark M.D.   On: 03/12/2020 21:02   DG Chest Port 1 View  Result Date: 03/12/2020 CLINICAL DATA:  Weakness and shortness of breath EXAM: PORTABLE CHEST 1 VIEW COMPARISON:  06/12/2019 FINDINGS: The Chin overlies the apices. Midline trachea. Reverse apical lordotic positioning. Mild cardiomegaly. Small left pleural effusion. No pneumothorax. Low lung volumes. Mild pulmonary interstitial prominence and indistinctness. Left greater than right base airspace disease. IMPRESSION: 1. Cardiomegaly with mild pulmonary venous congestion and small left pleural effusion. 2. Bibasilar airspace disease, atelectasis or infection. Electronically Signed   By: Jeronimo Greaves M.D.   On: 03/12/2020 17:56   ECHOCARDIOGRAM COMPLETE  Result Date: 03/13/2020    ECHOCARDIOGRAM REPORT   Patient Name:   NYKAYLA MARCELLI Date of Exam: 03/13/2020 Medical  Rec #:  [id number]      Height:       62.0 in Accession #:    [id number]     Weight:       132.2 lb Date of Birth:  1926-10-04       BSA:          1.603 m Patient Age:    89 years       BP:           101/61 mmHg Patient Gender: F              HR:           88 bpm. Exam Location:  Inpatient Procedure: 2D Echo Indications:    acute diastolic CHF 428.31  History:        Patient has no prior history of Echocardiogram examinations.                 Risk Factors:Hypertension.  Sonographer:    Delcie Roch Referring Phys: [phone] JARED M GARDNER IMPRESSIONS  1. Left ventricular ejection fraction, by estimation, is 40 to 45%. The left ventricle has mildly decreased function. The left ventricle demonstrates global hypokinesis. There is mild concentric left ventricular hypertrophy. Left ventricular diastolic function could not be evaluated.  2. Right ventricular systolic function is mildly reduced. The right ventricular size is mildly enlarged. There is normal pulmonary artery systolic pressure.  3. Left atrial size was severely dilated.  4. Right atrial size was moderately dilated.  5. There is severe thickening and calcification of the subvalvular mitral apparatus. There is severe annular calcification. The mitral valve has both degenerative and rheumatic features. Moderate mitral valve regurgitation. Mild to moderate mitral stenosis. The mean mitral valve gradient is 6.0 mmHg with average heart rate of 88 bpm.  6. The aortic valve is tricuspid. Aortic valve regurgitation is mild to moderate. Severe aortic valve stenosis. Aortic valve Vmax measures 4.38 m/s. FINDINGS  Left Ventricle: Left ventricular ejection fraction, by estimation, is 40 to 45%. The left ventricle has mildly decreased function. The left ventricle demonstrates global hypokinesis. The left ventricular internal cavity size was normal in size. There is  mild concentric left  ventricular hypertrophy. Left ventricular diastolic function could not be evaluated due  to mitral stenosis. Left ventricular diastolic function could not be evaluated. Right Ventricle: The right ventricular size is mildly enlarged. No increase in right ventricular wall thickness. Right ventricular systolic function is mildly reduced. There is normal pulmonary artery systolic pressure. The tricuspid regurgitant velocity  is 2.50 m/s, and with an assumed right atrial pressure of 8 mmHg, the estimated right ventricular systolic pressure is 33.0 mmHg. Left Atrium: Left atrial size was severely dilated. Right Atrium: Right atrial size was moderately dilated. Pericardium: There is no evidence of pericardial effusion. Mitral Valve: There is severe thickening and calcification of the subvalvular apparatus. The mitral valve is degenerative in appearance. There is moderate thickening of the mitral valve leaflet(s). There is moderate calcification of the mitral valve leaflet(s). Severe mitral annular calcification. Moderate mitral valve regurgitation, with centrally-directed jet. Mild to moderate mitral valve stenosis. MV peak gradient, 15.1 mmHg. The mean mitral valve gradient is 6.0 mmHg with average heart rate of 88 bpm. Tricuspid Valve: The tricuspid valve is normal in structure. Tricuspid valve regurgitation is trivial. Aortic Valve: The aortic valve is tricuspid. . There is severe thickening and severe calcifcation of the aortic valve. Aortic valve regurgitation is mild to moderate. Aortic regurgitation PHT measures 304 msec. Severe aortic stenosis is present. There is  severe thickening of the aortic valve. There is severe calcifcation of the aortic valve. Aortic valve mean gradient measures 50.5 mmHg. Aortic valve peak gradient measures 76.7 mmHg. Aortic valve area, by VTI measures 0.52 cm. Pulmonic Valve: The pulmonic valve was normal in structure. Pulmonic valve regurgitation is mild. Aorta: The aortic root and ascending aorta are structurally normal, with no evidence of dilitation. IAS/Shunts: No  atrial level shunt detected by color flow Doppler.  LEFT VENTRICLE PLAX 2D LVIDd:         5.10 cm  Diastology LVIDs:         4.10 cm  LV e' lateral:   6.73 cm/s LV PW:         1.20 cm  LV E/e' lateral: 25.4 LV IVS:        1.30 cm  LV e' medial:    3.99 cm/s LVOT diam:     1.70 cm  LV E/e' medial:  42.9 LV SV:         54 LV SV Index:   34 LVOT Area:     2.27 cm  RIGHT VENTRICLE            IVC RV Basal diam:  4.66 cm    IVC diam: 2.50 cm RV S prime:     9.89 cm/s TAPSE (M-mode): 1.1 cm LEFT ATRIUM              Index       RIGHT ATRIUM           Index LA diam:        5.50 cm  3.43 cm/m  RA Area:     15.10 cm LA Vol (A2C):   114.0 ml 71.11 ml/m RA Volume:   37.20 ml  23.20 ml/m LA Vol (A4C):   65.9 ml  41.10 ml/m LA Biplane Vol: 86.2 ml  53.77 ml/m  AORTIC VALVE AV Area (Vmax):    0.48 cm AV Area (Vmean):   0.48 cm AV Area (VTI):     0.52 cm AV Vmax:           438.00 cm/s AV Vmean:  340.500 cm/s AV VTI:            1.038 m AV Peak Grad:      76.7 mmHg AV Mean Grad:      50.5 mmHg LVOT Vmax:         92.75 cm/s LVOT Vmean:        72.700 cm/s LVOT VTI:          0.237 m LVOT/AV VTI ratio: 0.23 AI PHT:            304 msec  AORTA Ao Root diam: 3.10 cm Ao Asc diam:  3.60 cm MITRAL VALVE                TRICUSPID VALVE MV Area (PHT): 4.21 cm     TR Peak grad:   25.0 mmHg MV Peak grad:  15.1 mmHg    TR Vmax:        250.00 cm/s MV Mean grad:  6.0 mmHg MV Vmax:       1.94 m/s     SHUNTS MV Vmean:      112.0 cm/s   Systemic VTI:  0.24 m MV Decel Time: 180 msec     Systemic Diam: 1.70 cm MR Peak grad: 159.3 mmHg MR Mean grad: 109.0 mmHg MR Vmax:      631.00 cm/s MR Vmean:     501.0 cm/s MV E velocity: 171.00 cm/s MV A velocity: 119.00 cm/s MV E/A ratio:  1.44 Mihai Croitoru MD Electronically signed by Thurmon FairMihai Croitoru MD Signature Date/Time: 03/13/2020/6:03:07 PM    Final      The results of significant diagnostics from this hospitalization (including imaging, microbiology, ancillary and laboratory) are listed below  for reference.     Microbiology: Recent Results (from the past 240 hour(s))  Culture, blood (routine x 2)     Status: None (Preliminary result)   Collection Time: 03/12/20  5:50 PM   Specimen: BLOOD  Result Value Ref Range Status   Specimen Description   Final    BLOOD RIGHT ANTECUBITAL Performed at [facility] [place] [facility], [address] [place], [place] [address]    Special Requests   Final    BOTTLES DRAWN AEROBIC AND ANAEROBIC Blood Culture adequate volume Performed at [facility], [address] [place], [place] [address]    Culture   Final    NO GROWTH 3 DAYS Performed at [facility], [address] [place], [place] [address]    Report Status PENDING  Incomplete  SARS Coronavirus 2 by RT PCR (hospital order, performed in [facility] hospital lab) Nasopharyngeal Peripheral     Status: None   Collection Time: 03/12/20  5:52 PM   Specimen: Peripheral; Nasopharyngeal  Result Value Ref Range Status   SARS Coronavirus 2 NEGATIVE NEGATIVE Final    Comment: (NOTE) SARS-CoV-2 target nucleic acids are NOT DETECTED.  The SARS-CoV-2 RNA is generally detectable in upper and lower respiratory specimens during the acute phase of infection. The lowest concentration of SARS-CoV-2 viral copies this assay can detect is 250 copies / mL. A negative result does not preclude SARS-CoV-2 infection and should not be used as the sole basis for treatment or other patient management decisions.  A negative result may occur with improper specimen collection / handling, submission of specimen other than nasopharyngeal swab, presence of viral mutation(s) within the areas targeted by this assay, and inadequate number of viral copies (<250 copies / mL). A negative result must be  combined with clinical observations, patient history, and epidemiological information.  Fact Sheet for Patients:   BoilerBrush.com.cy  Fact Sheet for Healthcare  Providers: https://pope.com/  This test is not yet approved or  cleared by the [place] FDA and has been authorized for detection and/or diagnosis of SARS-CoV-2 by FDA under an Emergency Use Authorization (EUA).  This EUA will remain in effect (meaning this test can be used) for the duration of the COVID-19 declaration under Section 564(b)(1) of the Act, 21 U.S.C. section 360bbb-3(b)(1), unless the authorization is terminated or revoked sooner.  Performed at [facility], [address] [place], [place] [address]   Culture, blood (routine x 2)     Status: None (Preliminary result)   Collection Time: 03/12/20  6:07 PM   Specimen: BLOOD  Result Value Ref Range Status   Specimen Description   Final    BLOOD LEFT ANTECUBITAL Performed at [facility], [address] [place], [place] [address]    Special Requests   Final    BOTTLES DRAWN AEROBIC AND ANAEROBIC Blood Culture adequate volume Performed at [facility], [address] [place], [place] [address]    Culture   Final    NO GROWTH 3 DAYS Performed at [facility], [address] [place], [place] [address]    Report Status PENDING  Incomplete  Urine culture     Status: Abnormal   Collection Time: 03/12/20 11:54 PM   Specimen: Urine, Random  Result Value Ref Range Status   Specimen Description   Final    URINE, RANDOM Performed at [facility], [address] [place], [place] [address]    Special Requests   Final    NONE Performed at [facility], [address] [place], [place] [address]    Culture MULTIPLE SPECIES PRESENT, SUGGEST RECOLLECTION (A)  Final   Report Status 03/13/2020 FINAL  Final     Labs: BNP (last 3 results) Recent Labs    06/12/19 1950 03/12/20 1749  BNP 164.0* 3,112.4*   Basic Metabolic Panel: Recent Labs  Lab 03/12/20 1749 03/14/20 0437 03/15/20 0442 03/16/20 0822  NA 140 142 140 134*  K  3.5 3.3* 4.3 3.9  CL 106 104 101 99  CO2 24 26 29 25   GLUCOSE 122* 112* 107* 130*  BUN 36* 28* 25* 24*  CREATININE 1.31* 1.28* 1.32* 1.51*  CALCIUM 8.5* 8.7* 8.7* 8.7*   Liver Function Tests: Recent Labs  Lab 03/12/20 1749  AST 15  ALT 7  ALKPHOS 57  BILITOT 0.7  PROT 6.6  ALBUMIN 3.0*   No results for input(s): LIPASE, AMYLASE in the last 168 hours. No results for input(s): AMMONIA in the last 168 hours. CBC: Recent Labs  Lab 03/12/20 1749 03/13/20 0508 03/15/20 0442 03/16/20 0827  WBC 11.8* 9.4 8.5 10.3  NEUTROABS 8.5*  --   --   --   HGB 7.5* 7.1* 8.8* 9.7*  HCT 25.0* 23.6* 28.9* 32.0*  MCV 97.7 98.3 93.2 94.7  PLT 230 214 267 326   Cardiac Enzymes: No results for input(s): CKTOTAL, CKMB, CKMBINDEX, TROPONINI in the last 168 hours. BNP: Invalid input(s): POCBNP CBG: No results for input(s): GLUCAP in the last 168 hours. D-Dimer No results for input(s): DDIMER in the last 72 hours. Hgb A1c Recent Labs    03/14/20 1436  HGBA1C 4.8   Lipid Profile Recent Labs    03/15/20 0442  CHOL 139  HDL 30*  LDLCALC 75  TRIG 478*  CHOLHDL 4.6   Thyroid function studies No results for input(s): TSH, T4TOTAL, T3FREE, THYROIDAB in the last 72 hours.  Invalid input(s): FREET3 Anemia work up Recent Labs    03/13/20 1526  VITAMINB12 242  FOLATE 4.2*  FERRITIN 20  TIBC 298  IRON 13*  RETICCTPCT 5.9*   Urinalysis    Component Value Date/Time   COLORURINE YELLOW 03/13/2020 0001   APPEARANCEUR CLEAR 03/13/2020 0001   LABSPEC 1.010 03/13/2020 0001   PHURINE 5.0 03/13/2020 0001   GLUCOSEU NEGATIVE 03/13/2020 0001   HGBUR NEGATIVE 03/13/2020 0001   BILIRUBINUR NEGATIVE 03/13/2020 0001   KETONESUR NEGATIVE 03/13/2020 0001   PROTEINUR NEGATIVE 03/13/2020 0001   NITRITE NEGATIVE 03/13/2020 0001   LEUKOCYTESUR SMALL (A) 03/13/2020 0001   Sepsis Labs Invalid input(s): PROCALCITONIN,  WBC,  LACTICIDVEN Microbiology Recent Results (from the past 240 hour(s))   Culture, blood (routine x 2)     Status: None (Preliminary result)   Collection Time: 03/12/20  5:50 PM   Specimen: BLOOD  Result Value Ref Range Status   Specimen Description   Final    BLOOD RIGHT ANTECUBITAL Performed at [facility], [address] [place], [place] [address]    Special Requests   Final    BOTTLES DRAWN AEROBIC AND ANAEROBIC Blood Culture adequate volume Performed at [facility], [address] [place], [place] [address]    Culture   Final    NO GROWTH 3 DAYS Performed at [facility], [address], [place], [place] [address]    Report Status PENDING  Incomplete  SARS Coronavirus 2 by RT PCR (hospital order, performed in [facility] hospital lab) Nasopharyngeal Peripheral     Status: None   Collection Time: 03/12/20  5:52 PM   Specimen: Peripheral; Nasopharyngeal  Result Value Ref Range Status   SARS Coronavirus 2 NEGATIVE NEGATIVE Final    Comment: (NOTE) SARS-CoV-2 target nucleic acids are NOT DETECTED.  The SARS-CoV-2 RNA is generally detectable in upper and lower respiratory specimens during the acute phase of infection. The lowest concentration of SARS-CoV-2 viral copies this assay can detect is 250 copies / mL. A negative result does not preclude SARS-CoV-2 infection and should not be used as the sole basis for treatment or other patient management decisions.  A negative result may occur with improper specimen collection / handling, submission of specimen other than nasopharyngeal swab, presence of viral mutation(s) within the areas targeted by this assay, and inadequate number of viral copies (<250 copies / mL). A negative result must be combined with clinical observations, patient history, and epidemiological information.  Fact Sheet for Patients:   BoilerBrush.com.cy  Fact Sheet for Healthcare Providers: https://pope.com/  This test is not yet approved or  cleared  by the [place] FDA and has been authorized for detection and/or diagnosis of SARS-CoV-2 by FDA under an Emergency Use Authorization (EUA).  This EUA will remain in effect (meaning this test can be used) for the duration of the COVID-19 declaration under Section 564(b)(1) of the Act, 21 U.S.C. section 360bbb-3(b)(1), unless the authorization is terminated or revoked sooner.  Performed at [facility], [address] [place], [place] [address]   Culture, blood (routine x 2)     Status: None (Preliminary result)   Collection Time: 03/12/20  6:07 PM   Specimen: BLOOD  Result Value Ref Range Status  Specimen Description   Final    BLOOD LEFT ANTECUBITAL Performed at [facility], [address] [place], [place] [address]    Special Requests   Final    BOTTLES DRAWN AEROBIC AND ANAEROBIC Blood Culture adequate volume Performed at [facility], [address] [place], [place] [address]    Culture   Final    NO GROWTH 3 DAYS Performed at [facility], [address] [place], [place] [address]    Report Status PENDING  Incomplete  Urine culture     Status: Abnormal   Collection Time: 03/12/20 11:54 PM   Specimen: Urine, Random  Result Value Ref Range Status   Specimen Description   Final    URINE, RANDOM Performed at [facility], [address] [place], [place] [address]    Special Requests   Final    NONE Performed at [facility], [address] [place], [place] [address]    Culture MULTIPLE SPECIES PRESENT, SUGGEST RECOLLECTION (A)  Final   Report Status 03/13/2020 FINAL  Final     Time coordinating discharge in minutes: 65  SIGNED:   Calvert Cantor, MD  [facility] 03/16/2020, 10:54 AM

## 2020-03-16 NOTE — Progress Notes (Signed)
Progress Note  Patient Name: Morgan Holland Date of Encounter: 03/16/2020  [facility] Cardiologist: Chilton Si, MD (new)  Subjective   Feels like breathing is better, noticed her urine is less foamy today. Her bottom is sore from sitting, but she has been up in the chair and ambulating. She had a bowel movement yesterday but feels like it didn't empty completely. Didn't notice any blood, but stools always dark from iron. No other concerns today.  Inpatient Medications    Scheduled Meds: . folic acid  1 mg Oral BID  . furosemide  80 mg Intravenous Daily  . levothyroxine  100 mcg Oral q morning - 10a  . meclizine  12.5 mg Oral TID  . metoprolol tartrate  12.5 mg Oral BID  . pantoprazole  40 mg Oral BID AC  . polyethylene glycol  17 g Oral Daily  . sodium chloride flush  3 mL Intravenous Q12H  . vitamin B-12  1,000 mcg Oral Daily   Continuous Infusions: . sodium chloride     PRN Meds: sodium chloride, acetaminophen, alum & mag hydroxide-simeth, meclizine, ondansetron (ZOFRAN) IV, sodium chloride flush   Vital Signs    Vitals:   03/15/20 1129 03/15/20 1510 03/15/20 2037 03/16/20 0349  BP: 106/60 (!) 94/58 94/70 101/66  Pulse: 82 81 89 83  Resp:  17 19 18   Temp:  98.3 F (36.8 C) 98.3 F (36.8 C) 98.1 F (36.7 C)  TempSrc:  Oral Oral Oral  SpO2:  98% 93% 92%  Weight:    56.1 kg  Height:        Intake/Output Summary (Last 24 hours) at 03/16/2020 0842 Last data filed at 03/16/2020 0349 Gross per 24 hour  Intake 363 ml  Output 1050 ml  Net -687 ml   Last 3 Weights 03/16/2020 03/15/2020 03/14/2020  Weight (lbs) 123 lb 11.2 oz 126 lb 1.6 oz 127 lb 12.8 oz  Weight (kg) 56.11 kg 57.199 kg 57.97 kg      Telemetry    NSR - Personally Reviewed  ECG    No new - Personally Reviewed  Physical Exam   GEN: No acute distress.   Neck: No JVD visible at 60 degrees Cardiac: RRR, no rubs, or gallops. 4/6 systolic murmur, peaks early but continues through nearly all  systole with diminished S2, radiates to carotids. Respiratory: Clear to auscultation bilaterally except for fine crackles at bases GI: Soft, nontender, non-distended  MS: No edema; No deformity. Neuro:  Nonfocal  Psych: Normal affect   Labs    High Sensitivity Troponin:   Recent Labs  Lab 03/12/20 1749 03/13/20 0508 03/13/20 0719  TROPONINIHS 176* 169* 182*      Chemistry Recent Labs  Lab 03/12/20 1749 03/14/20 0437 03/15/20 0442  NA 140 142 140  K 3.5 3.3* 4.3  CL 106 104 101  CO2 24 26 29   GLUCOSE 122* 112* 107*  BUN 36* 28* 25*  CREATININE 1.31* 1.28* 1.32*  CALCIUM 8.5* 8.7* 8.7*  PROT 6.6  --   --   ALBUMIN 3.0*  --   --   AST 15  --   --   ALT 7  --   --   ALKPHOS 57  --   --   BILITOT 0.7  --   --   GFRNONAA 36* 37* 35*  GFRAA 41* 42* 41*  ANIONGAP 10 12 10      Hematology Recent Labs  Lab 03/12/20 1749 03/12/20 1749 03/13/20 03/14/20 03/13/20 1526 03/15/20 9323  WBC 11.8*  --  9.4  --  8.5  RBC 2.56*   < > 2.40* 2.55* 3.10*  HGB 7.5*  --  7.1*  --  8.8*  HCT 25.0*  --  23.6*  --  28.9*  MCV 97.7  --  98.3  --  93.2  MCH 29.3  --  29.6  --  28.4  MCHC 30.0  --  30.1  --  30.4  RDW 16.3*  --  15.9*  --  17.8*  PLT 230  --  214  --  267   < > = values in this interval not displayed.    BNP Recent Labs  Lab 03/12/20 1749  BNP 3,112.4*     DDimer No results for input(s): DDIMER in the last 168 hours.   Radiology    No results found.  Cardiac Studies   Echo 03/13/20: 1. Left ventricular ejection fraction, by estimation, is 40 to 45%. The  left ventricle has mildly decreased function. The left ventricle  demonstrates global hypokinesis. There is mild concentric left ventricular  hypertrophy. Left ventricular diastolic  function could not be evaluated.  2. Right ventricular systolic function is mildly reduced. The right  ventricular size is mildly enlarged. There is normal pulmonary artery  systolic pressure.  3. Left atrial size was  severely dilated.  4. Right atrial size was moderately dilated.  5. There is severe thickening and calcification of the subvalvular mitral  apparatus. There is severe annular calcification. The mitral valve has  both degenerative and rheumatic features. Moderate mitral valve  regurgitation. Mild to moderate mitral  stenosis. The mean mitral valve gradient is 6.0 mmHg with average heart  rate of 88 bpm.  6. The aortic valve is tricuspid. Aortic valve regurgitation is mild to  moderate. Severe aortic valve stenosis. Aortic valve Vmax measures 4.38  m/s.   Patient Profile     86 y.o. female with hypertension, aortic stenosis status post aortic valve replacement, SVT, and vertigo admitted with severe prosthetic aortic valve stenosis and acute systolic diastolic heart failure and acute on chronic heart failure.  Assessment & Plan    Severe prosthetic valve aortic stenosis: Mitral stenosis: -s/p bioprosthetic aortic valve. Mean gradient of 50 mmHg. Difficult situation given anemia, unlikely to be a good candidate for SAVR. Seen by Dr. Clifton James yesterday, felt to be a potential candidate for valve-in-valve TAVR once anemia stabilized and evaluated. Ok for outpatient TAVR eval. -degenerative mitral valve disease with mild to moderate mitral stenosis and a mean gradient of 6 mmHg and heart rate of 88.   Acute systolic and diastolic heart failure: -Admission BNP was 3000.  LVEF 40 to 45% with global hypokinesis and mild LVH.  -difficult I/O, as some output unmeasured. Reported as net even, but weight down from 59.4 kg on admission to 56.1 kg today -No labs collected yet today. Cr 1.32 yesterday from 1.28 day prior. Still appears volume up. Would continue IV lasix 80 mg IV BID for at least today. Last K 4.3. Suspect she is nearing euvolemia, may be able to change to oral soon.  Acute on chronic anemia:  Admission hbg 7.1.  Was 8.8 yesterday post transfusion. No labs yet today, I ordered  CBC -continue to trend, will need anemia evaluated and stabilized prior to TAVR workup  Demand ischemia:  High-sensitivity troponin was elevated to 182.  She has no ischemic symptoms at this time. Likely demand ischemia with acute heart failure  For questions or updates, please  contact [facility] Please consult [email or web address] for contact info under     Signed, Jodelle Red, MD  03/16/2020, 8:42 AM

## 2020-03-17 LAB — CULTURE, BLOOD (ROUTINE X 2)
Culture: NO GROWTH
Culture: NO GROWTH
Special Requests: ADEQUATE
Special Requests: ADEQUATE

## 2020-03-19 ENCOUNTER — Emergency Department (HOSPITAL_BASED_OUTPATIENT_CLINIC_OR_DEPARTMENT_OTHER): Payer: Medicare Other

## 2020-03-19 ENCOUNTER — Encounter (HOSPITAL_BASED_OUTPATIENT_CLINIC_OR_DEPARTMENT_OTHER): Payer: Self-pay | Admitting: *Deleted

## 2020-03-19 ENCOUNTER — Inpatient Hospital Stay (HOSPITAL_BASED_OUTPATIENT_CLINIC_OR_DEPARTMENT_OTHER)
Admission: EM | Admit: 2020-03-19 | Discharge: 2020-03-23 | DRG: 291 | Disposition: A | Discharge status: Hospice | Payer: Medicare Other | Attending: Internal Medicine | Admitting: Internal Medicine

## 2020-03-19 ENCOUNTER — Other Ambulatory Visit: Payer: Self-pay

## 2020-03-19 DIAGNOSIS — I959 Hypotension, unspecified: Secondary | ICD-10-CM

## 2020-03-19 DIAGNOSIS — H9193 Unspecified hearing loss, bilateral: Secondary | ICD-10-CM | POA: Diagnosis present

## 2020-03-19 DIAGNOSIS — R1314 Dysphagia, pharyngoesophageal phase: Secondary | ICD-10-CM | POA: Diagnosis present

## 2020-03-19 DIAGNOSIS — Z79899 Other long term (current) drug therapy: Secondary | ICD-10-CM

## 2020-03-19 DIAGNOSIS — N1832 Chronic kidney disease, stage 3b: Secondary | ICD-10-CM | POA: Diagnosis present

## 2020-03-19 DIAGNOSIS — Z7189 Other specified counseling: Secondary | ICD-10-CM | POA: Diagnosis not present

## 2020-03-19 DIAGNOSIS — Z8249 Family history of ischemic heart disease and other diseases of the circulatory system: Secondary | ICD-10-CM | POA: Diagnosis not present

## 2020-03-19 DIAGNOSIS — Z20822 Contact with and (suspected) exposure to covid-19: Secondary | ICD-10-CM | POA: Diagnosis present

## 2020-03-19 DIAGNOSIS — Z7982 Long term (current) use of aspirin: Secondary | ICD-10-CM | POA: Diagnosis not present

## 2020-03-19 DIAGNOSIS — L89152 Pressure ulcer of sacral region, stage 2: Secondary | ICD-10-CM | POA: Diagnosis present

## 2020-03-19 DIAGNOSIS — I350 Nonrheumatic aortic (valve) stenosis: Secondary | ICD-10-CM | POA: Diagnosis not present

## 2020-03-19 DIAGNOSIS — I130 Hypertensive heart and chronic kidney disease with heart failure and stage 1 through stage 4 chronic kidney disease, or unspecified chronic kidney disease: Principal | ICD-10-CM | POA: Diagnosis present

## 2020-03-19 DIAGNOSIS — E039 Hypothyroidism, unspecified: Secondary | ICD-10-CM | POA: Diagnosis present

## 2020-03-19 DIAGNOSIS — I10 Essential (primary) hypertension: Secondary | ICD-10-CM

## 2020-03-19 DIAGNOSIS — Z66 Do not resuscitate: Secondary | ICD-10-CM | POA: Diagnosis not present

## 2020-03-19 DIAGNOSIS — I5021 Acute systolic (congestive) heart failure: Secondary | ICD-10-CM

## 2020-03-19 DIAGNOSIS — I6330 Cerebral infarction due to thrombosis of unspecified cerebral artery: Secondary | ICD-10-CM | POA: Insufficient documentation

## 2020-03-19 DIAGNOSIS — Z953 Presence of xenogenic heart valve: Secondary | ICD-10-CM

## 2020-03-19 DIAGNOSIS — Z79890 Hormone replacement therapy (postmenopausal): Secondary | ICD-10-CM

## 2020-03-19 DIAGNOSIS — Z8616 Personal history of COVID-19: Secondary | ICD-10-CM | POA: Diagnosis not present

## 2020-03-19 DIAGNOSIS — N179 Acute kidney failure, unspecified: Secondary | ICD-10-CM | POA: Diagnosis present

## 2020-03-19 DIAGNOSIS — I252 Old myocardial infarction: Secondary | ICD-10-CM | POA: Diagnosis not present

## 2020-03-19 DIAGNOSIS — Z515 Encounter for palliative care: Secondary | ICD-10-CM | POA: Diagnosis not present

## 2020-03-19 DIAGNOSIS — I5023 Acute on chronic systolic (congestive) heart failure: Secondary | ICD-10-CM | POA: Diagnosis present

## 2020-03-19 DIAGNOSIS — I5043 Acute on chronic combined systolic (congestive) and diastolic (congestive) heart failure: Secondary | ICD-10-CM | POA: Diagnosis present

## 2020-03-19 DIAGNOSIS — R110 Nausea: Secondary | ICD-10-CM

## 2020-03-19 DIAGNOSIS — R1310 Dysphagia, unspecified: Secondary | ICD-10-CM | POA: Diagnosis not present

## 2020-03-19 DIAGNOSIS — D509 Iron deficiency anemia, unspecified: Secondary | ICD-10-CM | POA: Diagnosis not present

## 2020-03-19 DIAGNOSIS — I639 Cerebral infarction, unspecified: Secondary | ICD-10-CM | POA: Diagnosis not present

## 2020-03-19 DIAGNOSIS — D500 Iron deficiency anemia secondary to blood loss (chronic): Secondary | ICD-10-CM | POA: Diagnosis present

## 2020-03-19 DIAGNOSIS — L8990 Pressure ulcer of unspecified site, unspecified stage: Secondary | ICD-10-CM | POA: Diagnosis present

## 2020-03-19 DIAGNOSIS — E875 Hyperkalemia: Secondary | ICD-10-CM | POA: Diagnosis not present

## 2020-03-19 DIAGNOSIS — R0989 Other specified symptoms and signs involving the circulatory and respiratory systems: Secondary | ICD-10-CM

## 2020-03-19 DIAGNOSIS — I083 Combined rheumatic disorders of mitral, aortic and tricuspid valves: Secondary | ICD-10-CM | POA: Diagnosis present

## 2020-03-19 DIAGNOSIS — R933 Abnormal findings on diagnostic imaging of other parts of digestive tract: Secondary | ICD-10-CM | POA: Diagnosis not present

## 2020-03-19 DIAGNOSIS — R778 Other specified abnormalities of plasma proteins: Secondary | ICD-10-CM

## 2020-03-19 DIAGNOSIS — N183 Chronic kidney disease, stage 3 unspecified: Secondary | ICD-10-CM | POA: Diagnosis present

## 2020-03-19 DIAGNOSIS — Z8673 Personal history of transient ischemic attack (TIA), and cerebral infarction without residual deficits: Secondary | ICD-10-CM

## 2020-03-19 DIAGNOSIS — I509 Heart failure, unspecified: Secondary | ICD-10-CM | POA: Diagnosis present

## 2020-03-19 DIAGNOSIS — F411 Generalized anxiety disorder: Secondary | ICD-10-CM | POA: Diagnosis not present

## 2020-03-19 DIAGNOSIS — Q278 Other specified congenital malformations of peripheral vascular system: Secondary | ICD-10-CM

## 2020-03-19 DIAGNOSIS — R569 Unspecified convulsions: Secondary | ICD-10-CM | POA: Diagnosis present

## 2020-03-19 DIAGNOSIS — E785 Hyperlipidemia, unspecified: Secondary | ICD-10-CM | POA: Diagnosis present

## 2020-03-19 HISTORY — DX: Nonrheumatic aortic (valve) stenosis: I35.0

## 2020-03-19 LAB — CBC WITH DIFFERENTIAL/PLATELET
Abs Immature Granulocytes: 0.11 10*3/uL — ABNORMAL HIGH (ref 0.00–0.07)
Basophils Absolute: 0 10*3/uL (ref 0.0–0.1)
Basophils Relative: 0 %
Eosinophils Absolute: 0.1 10*3/uL (ref 0.0–0.5)
Eosinophils Relative: 1 %
HCT: 30.4 % — ABNORMAL LOW (ref 36.0–46.0)
Hemoglobin: 9.4 g/dL — ABNORMAL LOW (ref 12.0–15.0)
Immature Granulocytes: 1 %
Lymphocytes Relative: 20 %
Lymphs Abs: 1.7 10*3/uL (ref 0.7–4.0)
MCH: 29.6 pg (ref 26.0–34.0)
MCHC: 30.9 g/dL (ref 30.0–36.0)
MCV: 95.6 fL (ref 80.0–100.0)
Monocytes Absolute: 0.9 10*3/uL (ref 0.1–1.0)
Monocytes Relative: 10 %
Neutro Abs: 6 10*3/uL (ref 1.7–7.7)
Neutrophils Relative %: 68 %
Platelets: 255 10*3/uL (ref 150–400)
RBC: 3.18 MIL/uL — ABNORMAL LOW (ref 3.87–5.11)
RDW: 17.2 % — ABNORMAL HIGH (ref 11.5–15.5)
WBC: 8.8 10*3/uL (ref 4.0–10.5)
nRBC: 0 % (ref 0.0–0.2)

## 2020-03-19 LAB — COMPREHENSIVE METABOLIC PANEL
ALT: 8 U/L (ref 0–44)
AST: 22 U/L (ref 15–41)
Albumin: 3.1 g/dL — ABNORMAL LOW (ref 3.5–5.0)
Alkaline Phosphatase: 56 U/L (ref 38–126)
Anion gap: 16 — ABNORMAL HIGH (ref 5–15)
BUN: 47 mg/dL — ABNORMAL HIGH (ref 8–23)
CO2: 24 mmol/L (ref 22–32)
Calcium: 8.8 mg/dL — ABNORMAL LOW (ref 8.9–10.3)
Chloride: 94 mmol/L — ABNORMAL LOW (ref 98–111)
Creatinine, Ser: 1.82 mg/dL — ABNORMAL HIGH (ref 0.44–1.00)
GFR calc Af Amer: 28 mL/min — ABNORMAL LOW (ref >60)
GFR calc non Af Amer: 24 mL/min — ABNORMAL LOW (ref >60)
Glucose, Bld: 117 mg/dL — ABNORMAL HIGH (ref 70–99)
Potassium: 4.7 mmol/L (ref 3.5–5.1)
Sodium: 134 mmol/L — ABNORMAL LOW (ref 135–145)
Total Bilirubin: 1.2 mg/dL (ref 0.3–1.2)
Total Protein: 6.7 g/dL (ref 6.5–8.1)

## 2020-03-19 LAB — MAGNESIUM: Magnesium: 1.7 mg/dL (ref 1.7–2.4)

## 2020-03-19 LAB — BRAIN NATRIURETIC PEPTIDE: B Natriuretic Peptide: 4371.6 pg/mL — ABNORMAL HIGH (ref 0.0–100.0)

## 2020-03-19 LAB — TROPONIN I (HIGH SENSITIVITY): Troponin I (High Sensitivity): 796 ng/L (ref <18)

## 2020-03-19 LAB — SARS CORONAVIRUS 2 BY RT PCR (HOSPITAL ORDER, PERFORMED IN ~~LOC~~ HOSPITAL LAB): SARS Coronavirus 2: NEGATIVE

## 2020-03-19 MED ORDER — SODIUM CHLORIDE 0.9% FLUSH: 3.0000 mL | INTRAVENOUS | Status: DC | PRN

## 2020-03-19 MED ORDER — SODIUM CHLORIDE 0.9 % IV SOLN: 250.0000 mL | INTRAVENOUS | Status: DC | PRN

## 2020-03-19 MED ORDER — HEPARIN SODIUM (PORCINE) 5000 UNIT/ML IJ SOLN
5000.0000 [IU] | Freq: Three times a day (TID) | INTRAMUSCULAR | Status: AC
  Administered 2020-03-20 – 2020-03-21 (×7): 5000 [IU] via SUBCUTANEOUS
  Filled 2020-03-19 (×8): qty 1

## 2020-03-19 MED ORDER — PANTOPRAZOLE SODIUM 40 MG PO TBEC
40.0000 mg | DELAYED_RELEASE_TABLET | Freq: Every day | ORAL | Status: DC
  Administered 2020-03-20 – 2020-03-23 (×4): 40 mg via ORAL
  Filled 2020-03-19 (×4): qty 1

## 2020-03-19 MED ORDER — SODIUM CHLORIDE 0.9% FLUSH
3.0000 mL | Freq: Two times a day (BID) | INTRAVENOUS | Status: DC
  Administered 2020-03-20 – 2020-03-22 (×7): 3 mL via INTRAVENOUS

## 2020-03-19 MED ORDER — ONDANSETRON HCL 4 MG/2ML IJ SOLN
4.0000 mg | Freq: Four times a day (QID) | INTRAMUSCULAR | Status: DC | PRN
  Administered 2020-03-20 – 2020-03-23 (×8): 4 mg via INTRAVENOUS
  Filled 2020-03-19 (×8): qty 2

## 2020-03-19 MED ORDER — LEVOTHYROXINE SODIUM 100 MCG PO TABS
100.0000 ug | ORAL_TABLET | Freq: Every day | ORAL | Status: DC
  Administered 2020-03-20 – 2020-03-23 (×4): 100 ug via ORAL
  Filled 2020-03-19 (×4): qty 1

## 2020-03-19 MED ORDER — SODIUM CHLORIDE 0.9 % IV SOLN: INTRAVENOUS | Status: DC

## 2020-03-19 MED ORDER — ONDANSETRON HCL 4 MG/2ML IJ SOLN
4.0000 mg | Freq: Four times a day (QID) | INTRAMUSCULAR | Status: DC | PRN
  Administered 2020-03-19: 4 mg via INTRAVENOUS
  Filled 2020-03-19: qty 2

## 2020-03-19 MED ORDER — ASPIRIN EC 81 MG PO TBEC
81.0000 mg | DELAYED_RELEASE_TABLET | Freq: Every day | ORAL | Status: DC
  Administered 2020-03-20 – 2020-03-22 (×3): 81 mg via ORAL
  Filled 2020-03-19 (×4): qty 1

## 2020-03-19 MED ORDER — ACETAMINOPHEN 325 MG PO TABS: 650.0000 mg | ORAL_TABLET | ORAL | Status: DC | PRN

## 2020-03-19 NOTE — H&P (Signed)
History and Physical    Morgan Holland ZOX:[id number] DOB: 05/27/1927 DOA: 03/19/2020  PCP: Ardyth GalYbanez, [name], MD   Patient coming from: Home   Chief Complaint: Low BP, seizure  HPI: Morgan Holland is a 86 y.o. female with medical history significant for recent diagnosis of systolic CHF with EF 40 to 45%, severe aortic stenosis, chronic kidney disease, chronic anemia, hypothyroidism, and hypertension, now presenting to the emergency department with low blood pressures and seizure-like episode.  Patient was discharged from the hospital on 03/16/2020 after admission for newly diagnosed CHF.  Since returning home, her systolic blood pressure has been in the 80s much of the time and her granddaughter whom she lives with has been holding her metoprolol.  Patient complains of intermittent nausea without vomiting and states that she has been experiencing this ever since she was hospitalized with Covid last December.  She denies any chest pain or palpitations, has not noticed any leg swelling, and does not feel particularly short of breath.  Per report of her granddaughter, the patient was preparing to eat lunch on 03/18/2020 when she began shaking all over and would not respond to questioning.  This lasted 15 to 20 seconds and was followed by a period of somnolence.  Granddaughter has a child with epilepsy and states that this looked just like generalized seizures she has seen before along with a postictal period.  Patient reports that she was experiencing nausea prior to the episode, but again states that she has had this frequently for the past several months.  She has never had any seizure-like episodes previously per family report.  [facility] ED Course: Upon arrival to the ED, patient is found to be afebrile, saturating low 90s on room air, and with blood pressure 90/70.  EKG features sinus rhythm with T wave inversions.  Head CT is notable for advanced chronic ischemic disease but no acute intracranial abnormality.  Chest  x-ray with cardiomegaly, prominent interstitial markings, and possible small bilateral pleural effusions.  Chemistry panel reveals a creatinine 1.87, up from 1.51 two days earlier.  CBC notable for stable normocytic anemia.  Troponin is elevated to 796 and BNP is elevated 4372.  Covid PCR is negative.  Cardiology was consulted by the ED physician and hospitalist asked to admit.  Review of Systems:  All other systems reviewed and apart from HPI, are negative.  Past Medical History:  Diagnosis Date   Hypertension    MI (myocardial infarction) (HCC)    S/P AVR    Thyroid disease     Past Surgical History:  Procedure Laterality Date   AORTIC VALVE REPLACEMENT      Social History:   reports that she has never smoked. She has never used smokeless tobacco. She reports previous alcohol use. She reports that she does not use drugs.  No Known Allergies  Family History  Problem Relation Age of Onset   Cancer Mother    Heart attack Father      Prior to Admission medications   Medication Sig Start Date End Date Taking? Authorizing Provider  acetaminophen (TYLENOL) 325 MG tablet Take 650 mg by mouth every 6 (six) hours as needed for headache (pain).    [provider]  aspirin EC 81 MG tablet Take 81 mg by mouth 2 (two) times daily.     [provider]  ferrous sulfate 325 (65 FE) MG EC tablet Take 1 tablet by mouth 2 (two) times daily. 05/10/19   [provider]  folic acid (FOLVITE)  1 MG tablet Take 1 tablet (1 mg total) by mouth daily. 03/16/20   Calvert Cantor, MD  furosemide (LASIX) 40 MG tablet Take 1 tablet (40 mg total) by mouth daily. 03/16/20 03/16/21  Calvert Cantor, MD  levothyroxine (SYNTHROID) 100 MCG tablet Take 100 mcg by mouth every morning. 03/04/20   [provider]  meclizine (ANTIVERT) 12.5 MG tablet Take 1 tablet (12.5 mg total) by mouth 3 (three) times daily as needed for dizziness. 06/16/19   Dorcas Carrow, MD  metoprolol tartrate  (LOPRESSOR) 25 MG tablet Take 0.5 tablets (12.5 mg total) by mouth 2 (two) times daily. 03/16/20   Calvert Cantor, MD  Multiple Vitamin (MULTIVITAMIN) capsule Take 1 capsule by mouth daily.    [provider]  omeprazole (PRILOSEC) 40 MG capsule Take 40 mg by mouth 2 (two) times daily.  04/06/19   [provider]  ondansetron (ZOFRAN-ODT) 8 MG disintegrating tablet Take 8 mg by mouth every 12 (twelve) hours as needed for nausea. 11/23/19   [provider]  polyethylene glycol (MIRALAX / GLYCOLAX) 17 g packet Take 17 g by mouth daily. 03/16/20   Calvert Cantor, MD  potassium chloride (KLOR-CON) 10 MEQ tablet Take 2 tablets (20 mEq total) by mouth daily. 03/16/20   Calvert Cantor, MD  triamcinolone cream (KENALOG) 0.1 % Apply 1 application topically See admin instructions. Apply topically to chin daily as needed for itching/rash 06/20/15   [provider]  vitamin B-12 1000 MCG tablet Take 1 tablet (1,000 mcg total) by mouth daily. 03/16/20   Calvert Cantor, MD    Physical Exam: Vitals:   03/19/20 2006 03/19/20 2030 03/19/20 2100 03/19/20 2231  BP: 98/66 97/64 92/62  96/65  Pulse: 89 88 86 89  Resp: 16 (!) 22 18 17   Temp:    98.5 F (36.9 C)  TempSrc:    Oral  SpO2: 95% 95% 95% 96%  Weight:      Height:        Constitutional: NAD, calm  Eyes: PERTLA, lids and conjunctivae normal ENMT: Mucous membranes are moist. Posterior pharynx clear of any exudate or lesions.   Neck: normal, supple, no masses, no thyromegaly Respiratory:  no wheezing, no crackles. No accessory muscle use.  Cardiovascular: S1 & S2 heard, regular rate and rhythm. Mild pedal edema bilaterally.   Abdomen: No distension, no tenderness, soft. Bowel sounds active.  Musculoskeletal: no clubbing / cyanosis. No joint deformity upper and lower extremities.   Skin: no significant rashes, lesions, ulcers. Warm, dry, well-perfused. Neurologic: No facial asymmetry, gross hearing deficit. Sensation intact.  Moving all extremities.  Psychiatric: Alert and oriented to person, place, and situation. Very pleasant and cooperative.    Labs and Imaging on Admission: I have personally reviewed following labs and imaging studies  CBC: Recent Labs  Lab 03/13/20 0508 03/15/20 0442 03/16/20 0827 03/19/20 1644  WBC 9.4 8.5 10.3 8.8  NEUTROABS  --   --   --  6.0  HGB 7.1* 8.8* 9.7* 9.4*  HCT 23.6* 28.9* 32.0* 30.4*  MCV 98.3 93.2 94.7 95.6  PLT 214 267 326 255   Basic Metabolic Panel: Recent Labs  Lab 03/14/20 0437 03/15/20 0442 03/16/20 0822 03/19/20 1644  NA 142 140 134* 134*  K 3.3* 4.3 3.9 4.7  CL 104 101 99 94*  CO2 26 29 25 24   GLUCOSE 112* 107* 130* 117*  BUN 28* 25* 24* 47*  CREATININE 1.28* 1.32* 1.51* 1.82*  CALCIUM 8.7* 8.7* 8.7* 8.8*  MG  --   --   --  1.7   GFR: Estimated Creatinine Clearance: 15.9 mL/min (A) (by C-G formula based on SCr of 1.82 mg/dL (H)). Liver Function Tests: Recent Labs  Lab 03/19/20 1644  AST 22  ALT 8  ALKPHOS 56  BILITOT 1.2  PROT 6.7  ALBUMIN 3.1*   No results for input(s): LIPASE, AMYLASE in the last 168 hours. No results for input(s): AMMONIA in the last 168 hours. Coagulation Profile: No results for input(s): INR, PROTIME in the last 168 hours. Cardiac Enzymes: No results for input(s): CKTOTAL, CKMB, CKMBINDEX, TROPONINI in the last 168 hours. BNP (last 3 results) No results for input(s): PROBNP in the last 8760 hours. HbA1C: No results for input(s): HGBA1C in the last 72 hours. CBG: No results for input(s): GLUCAP in the last 168 hours. Lipid Profile: No results for input(s): CHOL, HDL, LDLCALC, TRIG, CHOLHDL, LDLDIRECT in the last 72 hours. Thyroid Function Tests: No results for input(s): TSH, T4TOTAL, FREET4, T3FREE, THYROIDAB in the last 72 hours. Anemia Panel: No results for input(s): VITAMINB12, FOLATE, FERRITIN, TIBC, IRON, RETICCTPCT in the last 72 hours. Urine analysis:    Component Value Date/Time   COLORURINE  YELLOW 03/13/2020 0001   APPEARANCEUR CLEAR 03/13/2020 0001   LABSPEC 1.010 03/13/2020 0001   PHURINE 5.0 03/13/2020 0001   GLUCOSEU NEGATIVE 03/13/2020 0001   HGBUR NEGATIVE 03/13/2020 0001   BILIRUBINUR NEGATIVE 03/13/2020 0001   KETONESUR NEGATIVE 03/13/2020 0001   PROTEINUR NEGATIVE 03/13/2020 0001   NITRITE NEGATIVE 03/13/2020 0001   LEUKOCYTESUR SMALL (A) 03/13/2020 0001   Sepsis Labs: (procalcitonin:4,lacticidven:4) ) Recent Results (from the past 240 hour(s))  Culture, blood (routine x 2)     Status: None   Collection Time: 03/12/20  5:50 PM   Specimen: BLOOD  Result Value Ref Range Status   Specimen Description   Final    BLOOD RIGHT ANTECUBITAL Performed at [facility], [address] [place], [place] [address]    Special Requests   Final    BOTTLES DRAWN AEROBIC AND ANAEROBIC Blood Culture adequate volume Performed at [facility], [address] [place], [place] [address]    Culture   Final    NO GROWTH 5 DAYS Performed at [facility], [address], [place], [place] [address]    Report Status 03/17/2020 FINAL  Final  SARS Coronavirus 2 by RT PCR (hospital order, performed in [facility] hospital lab) Nasopharyngeal Peripheral     Status: None   Collection Time: 03/12/20  5:52 PM   Specimen: Peripheral; Nasopharyngeal  Result Value Ref Range Status   SARS Coronavirus 2 NEGATIVE NEGATIVE Final    Comment: (NOTE) SARS-CoV-2 target nucleic acids are NOT DETECTED.  The SARS-CoV-2 RNA is generally detectable in upper and lower respiratory specimens during the acute phase of infection. The lowest concentration of SARS-CoV-2 viral copies this assay can detect is 250 copies / mL. A negative result does not preclude SARS-CoV-2 infection and should not be used as the sole basis for treatment or other patient management decisions.  A negative result may occur with improper specimen collection / handling, submission of specimen  other than nasopharyngeal swab, presence of viral mutation(s) within the areas targeted by this assay, and inadequate number of viral copies (<250 copies / mL). A negative result must be combined with clinical observations, patient history, and epidemiological information.  Fact Sheet for Patients:   BoilerBrush.com.cy  Fact Sheet for Healthcare Providers: https://pope.com/  This test is not yet approved or  cleared by the [place] and has been authorized for detection and/or diagnosis of SARS-CoV-2 by FDA under an Emergency Use Authorization (EUA).  This EUA will remain in effect (meaning this test can be used) for the duration of the COVID-19 declaration under Section 564(b)(1) of the Act, 21 U.S.C. section 360bbb-3(b)(1), unless the authorization is terminated or revoked sooner.  Performed at [facility], [address] [place], [place] [address]   Culture, blood (routine x 2)     Status: None   Collection Time: 03/12/20  6:07 PM   Specimen: BLOOD  Result Value Ref Range Status   Specimen Description   Final    BLOOD LEFT ANTECUBITAL Performed at [facility], [address] [place], [place] [address]    Special Requests   Final    BOTTLES DRAWN AEROBIC AND ANAEROBIC Blood Culture adequate volume Performed at [facility], [address] [place], [place] [address]    Culture   Final    NO GROWTH 5 DAYS Performed at [facility], [address], [place], [place] [address]    Report Status 03/17/2020 FINAL  Final  Urine culture     Status: Abnormal   Collection Time: 03/12/20 11:54 PM   Specimen: Urine, Random  Result Value Ref Range Status   Specimen Description   Final    URINE, RANDOM Performed at [facility], [address] [place], [place] [address]    Special Requests   Final    NONE Performed at [facility], [address] [place], [place] [address]    Culture MULTIPLE SPECIES PRESENT, SUGGEST RECOLLECTION (A)  Final   Report Status 03/13/2020 FINAL  Final  SARS Coronavirus 2 by RT PCR (hospital order, performed in [facility] hospital lab) Nasopharyngeal Nasopharyngeal Swab     Status: None   Collection Time: 03/19/20  7:52 PM   Specimen: Nasopharyngeal Swab  Result Value Ref Range Status   SARS Coronavirus 2 NEGATIVE NEGATIVE Final    Comment: (NOTE) SARS-CoV-2 target nucleic acids are NOT DETECTED.  The SARS-CoV-2 RNA is generally detectable in upper and lower respiratory specimens during the acute phase of infection. The lowest concentration of SARS-CoV-2 viral copies this assay can detect is 250 copies / mL. A negative result does not preclude SARS-CoV-2 infection and should not be used as the sole basis for treatment or other patient management decisions.  A negative result may occur with improper specimen collection / handling, submission of specimen other than nasopharyngeal swab, presence of viral mutation(s) within the areas targeted by this assay, and inadequate number of viral copies (<250 copies / mL). A negative result must be combined with clinical observations, patient history, and epidemiological information.  Fact Sheet for Patients:   BoilerBrush.com.cy  Fact Sheet for Healthcare Providers: https://pope.com/  This test is not yet approved or  cleared by the [place] FDA and has been authorized for detection and/or diagnosis of SARS-CoV-2 by FDA under an Emergency Use Authorization (EUA).  This EUA will remain in effect (meaning this test can be used) for the duration of the COVID-19 declaration under Section 564(b)(1) of the Act, 21 U.S.C. section 360bbb-3(b)(1), unless the authorization is terminated or revoked sooner.  Performed at [facility], [address] [place], [place] [address]      Radiological Exams on  Admission: CT Head Wo Contrast  Result Date: 03/19/2020  CLINICAL DATA:  86 year old female recently hospitalized with CHF. Seizure last night. EXAM: CT HEAD WITHOUT CONTRAST TECHNIQUE: Contiguous axial images were obtained from the base of the skull through the vertex without intravenous contrast. COMPARISON:  None. FINDINGS: Brain: No midline shift, mass effect, or evidence of intracranial mass lesion. No acute intracranial hemorrhage identified. No ventriculomegaly. Patchy and confluent widespread cerebral white matter hypodensity, including deep white matter capsule involvement which appears greater in the left hemisphere. Comparatively mild heterogeneity in the bilateral deep gray nuclei. Small chronic appearing infarct of the right superior cerebellum. Occasional small areas of cortical encephalomalacia, including in the left middle frontal gyrus on coronal image 23. No cortically based acute infarct identified. Vascular: Calcified atherosclerosis at the skull base. Skull: No acute osseous abnormality identified. Sinuses/Orbits: Mild bubbly opacity in the right sphenoid. Other Visualized paranasal sinuses and mastoids are clear. Other: No acute orbit or scalp soft tissue finding. IMPRESSION: Advanced chronic ischemic disease with no acute intracranial abnormality by CT. Electronically Signed   By: Odessa Fleming M.D.   On: 03/19/2020 17:00   DG Chest Port 1 View  Result Date: 03/19/2020 CLINICAL DATA:  Low blood pressure. EXAM: PORTABLE CHEST 1 VIEW COMPARISON:  A 08/12/2019 FINDINGS: The heart size remains enlarged. The patient is status post prior median sternotomy. There are hazy bilateral airspace opacities with prominent interstitial lung markings. Aortic calcifications are again noted. There is no pneumothorax. There is no acute osseous abnormality. There may be small bilateral pleural effusions. IMPRESSION: Cardiomegaly with findings of congestive heart failure. Electronically Signed   By: Katherine Mantle M.D.   On: 03/19/2020 17:00    EKG: Independently reviewed. Sinus rhythm, T-wave inversions.   Assessment/Plan   1. Acute on chronic systolic CHF  - Patient recently found to have EF 40-45%, now presents with nausea and low BP at home, and is found to have BNP 4372 and CXR findings consistent with acute CHF  - Cardiology consulted by ED physician, will follow-up recs  - Monitor I/O and daily weights, restrict fluid intake, and hold Lasix for now as she is not in any distress, has AKI on CKD, and BP has been low    2. Acute kidney injury superimposed on CKD IIIb  - SCr is 1.87 on admission, up from 1.51 on 03/16/20  - Likely acute prerenal azotemia in setting of hypotension at home - ACE was discontinued during recent admission  - Hold Lasix for now as above, check urine chemistries, renally-dose medications, and repeat chem panel in am   3. Seizure-like activity  - Family witnessed a 15-20 sec episode of generalized shaking on 9/6 with postictal period; this was her first episode  - Check MRI and EEG    4. Elevated troponin   - Troponin elevated to 796 without chest pain  - EKG with T-wave inversions  - Cardiology consulting and much appreciated  - Continue cardiac monitoring, repeat troponin and EKG, continue ASA    5. Severe AS  - Noted on echo during recent admission  - She is following with cardiology and being considered for TAVR    6. Hypothyroidism  - TSH was normal a week ago  - Continue Synthroid   7. Anemia  - Hgb is 9.4 on admission, similar to priors will monitor    8. Hypotension; history of HTN  - BP was on low side during recent admission, lisinopril and hydralazine were stopped and metoprolol reduced  - Patient has been monitoring BP at  home and has been running 80s/50s per her grandaughter who has been holding the metoprolol for the past few days because of this  - SBP 90s-low 100s since arrival, will continue to hold metoprolol for now    DVT  prophylaxis: sq heparin  Code Status: Full, confirmed with patient on admission  Family Communication: Lamount Cranker (grandaughter) updated by phone   Disposition Plan:  Patient is from: Home  Anticipated d/c is to: TBD Anticipated d/c date is: 03/22/20 Patient currently: Pending cardiology consultation, EEG and MRI  Consults called: Cardiology consulted by ED physician  Admission status: Inpatient     Briscoe Deutscher, MD [facility]  03/19/2020, 11:27 PM

## 2020-03-19 NOTE — ED Provider Notes (Signed)
[facility] EMERGENCY DEPARTMENT Provider Note   CSN: [id number] Arrival date & time: 03/19/20  1353     History Chief Complaint  Patient presents with  . Hypotension    Morgan Holland is a 86 y.o. female.  Patient with recent admission to [facility] with cardiology consultation patient was discharged home on September 4.  Patient during that admission had a new diagnosis of acute systolic heart failure.  Patient also with valve disease.  Cardiology was considering a TAV procedure.  Patient brought back in by daughter today because using their wrist blood pressure cuff that they have at home her pressures were running in the mid 80s.  And daughter also noted that her mother oxygen levels have been going down at night but they were fine during the daytime.  And there also was an event this morning when patient was sitting up of may be a seizure lasting maybe 15 seconds with some tonic-clonic movement.  But then patient seemed to be fine.  Not clear whether this was a hypotensive episode as well.  Patient without any specific complaints currently.  Patient is on a beta-blocker but the daughter has held that for the past 3 days.  Patient not on a blood thinner.  Patient is on Lasix and that was increased during her last hospitalization.  Discharged home with 40 mg daily according to the notes.  Patient denies any chest pain.  Blood pressures here initially systolic was a slightly above 409100.  Past medical history sniffing for hypertension myocardial infarction status post AVR and history of thyroid disease.  Oxygen saturations on room air in the upper 90s.        Past Medical History:  Diagnosis Date  . Hypertension   . MI (myocardial infarction) (HCC)   . S/P AVR   . Thyroid disease     Patient Active Problem List   Diagnosis Date Noted  . 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 Iron deficiency anemia 03/16/2020  . Mitral regurgitation and aortic stenosis 03/16/2020  .  Acute combined systolic and diastolic heart failure (HCC)   . Aortic valve replaced   . Severe aortic stenosis   . Chronic blood loss anemia 03/13/2020  . Chronic GI bleeding 03/13/2020  . New onset of congestive heart failure (HCC) 03/12/2020  . Pressure injury of skin 06/13/2019  . Pneumonia due to COVID-19 virus 06/12/2019  . Hypothyroidism 06/12/2019  . Hypertension   . Bradycardia     Past Surgical History:  Procedure Laterality Date  . AORTIC VALVE REPLACEMENT       OB History   No obstetric history on file.     Family History  Problem Relation Age of Onset  . Cancer Mother   . Heart attack Father     Social History   Tobacco Use  . Smoking status: Never Smoker  . Smokeless tobacco: Never Used  Substance Use Topics  . Alcohol use: Not Currently  . Drug use: Never    Home Medications Prior to Admission medications   Medication Sig Start Date End Date Taking? Authorizing Provider  acetaminophen (TYLENOL) 325 MG tablet Take 650 mg by mouth every 6 (six) hours as needed for headache (pain).    [provider]  aspirin EC 81 MG tablet Take 81 mg by mouth 2 (two) times daily.     [provider]  ferrous sulfate 325 (65 FE) MG EC tablet Take 1 tablet by mouth 2 (two) times daily. 05/10/19  [provider]  folic acid (FOLVITE) 1 MG tablet Take 1 tablet (1 mg total) by mouth daily. 03/16/20   Calvert Cantor, MD  furosemide (LASIX) 40 MG tablet Take 1 tablet (40 mg total) by mouth daily. 03/16/20 03/16/21  Calvert Cantor, MD  levothyroxine (SYNTHROID) 100 MCG tablet Take 100 mcg by mouth every morning. 03/04/20   [provider]  meclizine (ANTIVERT) 12.5 MG tablet Take 1 tablet (12.5 mg total) by mouth 3 (three) times daily as needed for dizziness. 06/16/19   Dorcas Carrow, MD  metoprolol tartrate (LOPRESSOR) 25 MG tablet Take 0.5 tablets (12.5 mg total) by mouth 2 (two) times daily. 03/16/20   Calvert Cantor, MD  Multiple Vitamin  (MULTIVITAMIN) capsule Take 1 capsule by mouth daily.    [provider]  omeprazole (PRILOSEC) 40 MG capsule Take 40 mg by mouth 2 (two) times daily.  04/06/19   [provider]  ondansetron (ZOFRAN-ODT) 8 MG disintegrating tablet Take 8 mg by mouth every 12 (twelve) hours as needed for nausea. 11/23/19   [provider]  polyethylene glycol (MIRALAX / GLYCOLAX) 17 g packet Take 17 g by mouth daily. 03/16/20   Calvert Cantor, MD  potassium chloride (KLOR-CON) 10 MEQ tablet Take 2 tablets (20 mEq total) by mouth daily. 03/16/20   Calvert Cantor, MD  triamcinolone cream (KENALOG) 0.1 % Apply 1 application topically See admin instructions. Apply topically to chin daily as needed for itching/rash 06/20/15   [provider]  vitamin B-12 1000 MCG tablet Take 1 tablet (1,000 mcg total) by mouth daily. 03/16/20   Calvert Cantor, MD    Allergies    Patient has no known allergies.  Review of Systems   Review of Systems  Constitutional: Negative for chills and fever.  HENT: Negative for congestion, rhinorrhea and sore throat.   Eyes: Negative for visual disturbance.  Respiratory: Negative for cough and shortness of breath.   Cardiovascular: Negative for chest pain and leg swelling.  Gastrointestinal: Negative for abdominal pain, diarrhea, nausea and vomiting.  Genitourinary: Negative for dysuria.  Musculoskeletal: Negative for back pain and neck pain.  Skin: Negative for rash.  Neurological: Positive for seizures. Negative for dizziness, light-headedness and headaches.  Hematological: Does not bruise/bleed easily.  Psychiatric/Behavioral: Negative for confusion.    Physical Exam Updated Vital Signs BP 90/69   Pulse 90   Temp 98.1 F (36.7 C) (Oral)   Resp 19   Ht 1.575 m (5\' 2" )   Wt 56.1 kg   SpO2 98%   BMI 22.62 kg/m   Physical Exam Vitals and nursing note reviewed.  Constitutional:      General: She is not in acute distress.    Appearance: Normal  appearance. She is well-developed.  HENT:     Head: Normocephalic and atraumatic.  Eyes:     Conjunctiva/sclera: Conjunctivae normal.     Pupils: Pupils are equal, round, and reactive to light.  Cardiovascular:     Rate and Rhythm: Normal rate and regular rhythm.     Heart sounds: No murmur heard.   Pulmonary:     Effort: Pulmonary effort is normal. No respiratory distress.     Breath sounds: Normal breath sounds. No rales.  Abdominal:     Palpations: Abdomen is soft.     Tenderness: There is no abdominal tenderness.  Musculoskeletal:        General: No swelling. Normal range of motion.     Cervical back: Normal range of motion and neck supple.  Skin:    General: Skin is warm and dry.     Capillary Refill: Capillary refill takes less than 2 seconds.  Neurological:     General: No focal deficit present.     Mental Status: She is alert and oriented to person, place, and time.     Cranial Nerves: No cranial nerve deficit.     Sensory: No sensory deficit.     Motor: No weakness.     ED Results / Procedures / Treatments   Labs (all labs ordered are listed, but only abnormal results are displayed) Labs Reviewed  CBC WITH DIFFERENTIAL/PLATELET - Abnormal; Notable for the following components:      Result Value   RBC 3.18 (*)    Hemoglobin 9.4 (*)    HCT 30.4 (*)    RDW 17.2 (*)    Abs Immature Granulocytes 0.11 (*)    All other components within normal limits  COMPREHENSIVE METABOLIC PANEL - Abnormal; Notable for the following components:   Sodium 134 (*)    Chloride 94 (*)    Glucose, Bld 117 (*)    BUN 47 (*)    Creatinine, Ser 1.82 (*)    Calcium 8.8 (*)    Albumin 3.1 (*)    GFR calc non Af Amer 24 (*)    GFR calc Af Amer 28 (*)    Anion gap 16 (*)    All other components within normal limits  BRAIN NATRIURETIC PEPTIDE - Abnormal; Notable for the following components:   B Natriuretic Peptide 4,371.6 (*)    All other components within normal limits  MAGNESIUM     EKG EKG Interpretation  Date/Time:  Tuesday March 19 2020 14:59:35 EDT Ventricular Rate:  96 PR Interval:    QRS Duration: 86 QT Interval:  317 QTC Calculation: 401 R Axis:   95 Text Interpretation: Sinus rhythm Anteroseptal infarct, age indeterminate Lateral leads are also involved Baseline wander in lead(s) V6 New since previous tracing Confirmed by Vanetta Mulders [phone]) on 03/19/2020 3:19:09 PM   Radiology CT Head Wo Contrast  Result Date: 03/19/2020 CLINICAL DATA:  86 year old female recently hospitalized with CHF. Seizure last night. EXAM: CT HEAD WITHOUT CONTRAST TECHNIQUE: Contiguous axial images were obtained from the base of the skull through the vertex without intravenous contrast. COMPARISON:  None. FINDINGS: Brain: No midline shift, mass effect, or evidence of intracranial mass lesion. No acute intracranial hemorrhage identified. No ventriculomegaly. Patchy and confluent widespread cerebral white matter hypodensity, including deep white matter capsule involvement which appears greater in the left hemisphere. Comparatively mild heterogeneity in the bilateral deep gray nuclei. Small chronic appearing infarct of the right superior cerebellum. Occasional small areas of cortical encephalomalacia, including in the left middle frontal gyrus on coronal image 23. No cortically based acute infarct identified. Vascular: Calcified atherosclerosis at the skull base. Skull: No acute osseous abnormality identified. Sinuses/Orbits: Mild bubbly opacity in the right sphenoid. Other Visualized paranasal sinuses and mastoids are clear. Other: No acute orbit or scalp soft tissue finding. IMPRESSION: Advanced chronic ischemic disease with no acute intracranial abnormality by CT. Electronically Signed   By: Odessa Fleming M.D.   On: 03/19/2020 17:00   DG Chest Port 1 View  Result Date: 03/19/2020 CLINICAL DATA:  Low blood pressure. EXAM: PORTABLE CHEST 1 VIEW COMPARISON:  A 08/12/2019 FINDINGS: The heart  size remains enlarged. The patient is status post prior median sternotomy. There are hazy bilateral airspace opacities with prominent interstitial lung markings. Aortic calcifications are again noted.  There is no pneumothorax. There is no acute osseous abnormality. There may be small bilateral pleural effusions. IMPRESSION: Cardiomegaly with findings of congestive heart failure. Electronically Signed   By: Katherine Mantle M.D.   On: 03/19/2020 17:00    Procedures Procedures (including critical care time)  CRITICAL CARE Performed by: Vanetta Mulders Total critical care time: 45 minutes Critical care time was exclusive of separately billable procedures and treating other patients. Critical care was necessary to treat or prevent imminent or life-threatening deterioration. Critical care was time spent personally by me on the following activities: development of treatment plan with patient and/or surrogate as well as nursing, discussions with consultants, evaluation of patient's response to treatment, examination of patient, obtaining history from patient or surrogate, ordering and performing treatments and interventions, ordering and review of laboratory studies, ordering and review of radiographic studies, pulse oximetry and re-evaluation of patient's condition.    Medications Ordered in ED Medications  0.9 %  sodium chloride infusion ( Intravenous New Bag/Given 03/19/20 1701)    ED Course  I have reviewed the triage vital signs and the nursing notes.  Pertinent labs & imaging results that were available during my care of the patient were reviewed by me and considered in my medical decision making (see chart for details).    MDM Rules/Calculators/A&P                          Patient clinically here initially looked very good with blood pressures in the low 100s systolic.  Oxygen saturations in the upper 90s no leg swelling patient not in any distress.  Neuro exam without any acute  findings.  Head CT was negative this was done for the questionable seizure activity.  Which may have been a hypotensive episode.  But then her blood pressures started to drop down and now had 1 at 85 systolic.  And then the next one was 90 systolic.  The last pressure was around 95.  Chest x-ray consistent with congestive heart failure.  BNP was in the 4000 range to higher than what it was during the last admission.   Patient with past history of anemia.  But hemoglobin here 9.4 which is similar to where she was during the last hospitalization.  Also renal function was worse with an elevation in BUN and creatinine worse than when she was in the hospital last time.  Based on the combination of the hypotension chest x-ray consistent with congestive heart failure elevated BNP and worsening renal function.  Patient will require admission.  Discussed with Dr. Delton See from cardiology service at [facility].  Cardiology agrees with the admission and they will consult.  Hospitalist to admit.  Discussed with hospitalist [place] patient will be admitted to the [facility] service.  We will hold on any Lasix at this time since patient very stable.  We will continue to monitor blood pressures.  Last blood pressure was 95 systolic.  Patient very comfortable in no acute distress.     Final Clinical Impression(s) / ED Diagnoses Final diagnoses:  Hypotension, unspecified hypotension type  Acute systolic congestive heart failure [facility])    Rx / DC Orders ED Discharge Orders    None       Vanetta Mulders, MD 03/19/20 [phone]

## 2020-03-19 NOTE — Progress Notes (Signed)
   03/19/20 2231  Vitals  Temp 98.5 F (36.9 C)  Temp Source Oral  BP 96/65  MAP (mmHg) 73  BP Location Left Arm  BP Method Automatic  Patient Position (if appropriate) Lying  Pulse Rate 89  Resp 17  MEWS COLOR  MEWS Score Color Green  Oxygen Therapy  SpO2 96 %  MEWS Score  MEWS Temp 0  MEWS Systolic 1  MEWS Pulse 0  MEWS RR 0  MEWS LOC 0  MEWS Score 1  Admitted pt to room 3E04 from [facility] via [facility], pt is alert and oriented, currently denies any pain. Placed on cardiac monitor, CCMD made aware, oriented to room, call bell placed within reach. Admission placement made aware. Pt placed on low bed.

## 2020-03-19 NOTE — ED Triage Notes (Signed)
She was recently admitted to [facility] with new diagnosis of CHF. Her BP has been low since getting home. Her family states she feels she had a  20 sec seizure last night but refused EMS to be called. She is ambulatory using a walker,  alert oriented. She states she is nauseated and has a headache.

## 2020-03-20 ENCOUNTER — Inpatient Hospital Stay (HOSPITAL_COMMUNITY): Payer: Medicare Other

## 2020-03-20 ENCOUNTER — Encounter (HOSPITAL_COMMUNITY): Payer: Self-pay | Admitting: Family Medicine

## 2020-03-20 DIAGNOSIS — I6330 Cerebral infarction due to thrombosis of unspecified cerebral artery: Secondary | ICD-10-CM | POA: Insufficient documentation

## 2020-03-20 DIAGNOSIS — I639 Cerebral infarction, unspecified: Secondary | ICD-10-CM

## 2020-03-20 DIAGNOSIS — I5023 Acute on chronic systolic (congestive) heart failure: Secondary | ICD-10-CM

## 2020-03-20 DIAGNOSIS — I959 Hypotension, unspecified: Secondary | ICD-10-CM

## 2020-03-20 DIAGNOSIS — R569 Unspecified convulsions: Secondary | ICD-10-CM

## 2020-03-20 LAB — CBC
HCT: 27.6 % — ABNORMAL LOW (ref 36.0–46.0)
Hemoglobin: 8.3 g/dL — ABNORMAL LOW (ref 12.0–15.0)
MCH: 28.8 pg (ref 26.0–34.0)
MCHC: 30.1 g/dL (ref 30.0–36.0)
MCV: 95.8 fL (ref 80.0–100.0)
Platelets: 244 10*3/uL (ref 150–400)
RBC: 2.88 MIL/uL — ABNORMAL LOW (ref 3.87–5.11)
RDW: 17.2 % — ABNORMAL HIGH (ref 11.5–15.5)
WBC: 9.5 10*3/uL (ref 4.0–10.5)
nRBC: 0 % (ref 0.0–0.2)

## 2020-03-20 LAB — BASIC METABOLIC PANEL
Anion gap: 12 (ref 5–15)
BUN: 42 mg/dL — ABNORMAL HIGH (ref 8–23)
CO2: 25 mmol/L (ref 22–32)
Calcium: 8.6 mg/dL — ABNORMAL LOW (ref 8.9–10.3)
Chloride: 102 mmol/L (ref 98–111)
Creatinine, Ser: 1.86 mg/dL — ABNORMAL HIGH (ref 0.44–1.00)
GFR calc Af Amer: 27 mL/min — ABNORMAL LOW (ref >60)
GFR calc non Af Amer: 23 mL/min — ABNORMAL LOW (ref >60)
Glucose, Bld: 124 mg/dL — ABNORMAL HIGH (ref 70–99)
Potassium: 4.4 mmol/L (ref 3.5–5.1)
Sodium: 139 mmol/L (ref 135–145)

## 2020-03-20 LAB — TROPONIN I (HIGH SENSITIVITY): Troponin I (High Sensitivity): 769 ng/L (ref <18)

## 2020-03-20 MED ORDER — SODIUM CHLORIDE 0.9 % IV SOLN: INTRAVENOUS | Status: AC

## 2020-03-20 MED ORDER — ATORVASTATIN CALCIUM 80 MG PO TABS
80.0000 mg | ORAL_TABLET | Freq: Every day | ORAL | Status: DC
  Administered 2020-03-20: 80 mg via ORAL
  Filled 2020-03-20: qty 1

## 2020-03-20 MED ORDER — ATORVASTATIN CALCIUM 10 MG PO TABS
20.0000 mg | ORAL_TABLET | Freq: Every day | ORAL | Status: DC
  Administered 2020-03-21 – 2020-03-22 (×2): 20 mg via ORAL
  Filled 2020-03-20 (×3): qty 2

## 2020-03-20 NOTE — Progress Notes (Signed)
EEG Completed; Results Pending  

## 2020-03-20 NOTE — Consult Note (Addendum)
Neurology Consultation  Reason for Consult: Bilateral infarcts on MRI Referring Physician: Lanae Boast, MD  CC: Bilateral infarcts on MRI  History is obtained from: Chart and patient  HPI: Morgan Holland is a 86 y.o. female with past medical history of atrial valve replacement, myocardial infarct, along with severe aortic stenosis which was noted on echocardiogram during previous admission. And hypertension.  Patient presented to the hospital on 03/19/2020 secondary to hypotension and possible seizure-like episode.  Patient systolic blood pressure dropped to the 80s.  Her granddaughter lives with her and held her metoprolol.  Patient was also complaining of intermittent nausea vomiting which she has been experiencing since she was hospitalized in December for Covid.  Per her granddaughter the patient apparently was preparing to eat lunch on 03/18/2020 when she began to have shaking motions throughout and was not responding.  This lasted for 15 to 20 seconds followed by period of somnolence.  Granddaughter reports that she has never had seizure-like episodes in the past.  On arrival to the ED patient was afebrile, saturating in the 90s on room air with blood pressure of 90/70.  She had a creatinine of 1.87 which was up from 1.51 couple days previously.  BNP was 796.  Due to her seizure-like activity a MRI of the brain was obtained along with EEG.  MRI brain showed small acute to subacute infarct along the bilateral cerebral cortex which is likely embolic.  For that reason neurology was consulted.  Currently patient has no complaints other than feeling generalized weakness.  LKW: No clear last known well tpa given?: no, out of window Premorbid modified Rankin scale (mRS): 2 NIHSS:0   Past Medical History:  Diagnosis Date  . Aortic stenosis, severe   . Hypertension   . MI (myocardial infarction) (HCC)   . S/P AVR   . Thyroid disease     Family History  Problem Relation Age of Onset  . Cancer  Mother   . Heart attack Father      Social History:   reports that she has never smoked. She has never used smokeless tobacco. She reports previous alcohol use. She reports that she does not use drugs.  Medications  Current Facility-Administered Medications:  .  0.9 %  sodium chloride infusion, 250 mL, Intravenous, PRN, Opyd, Lavone Neri, MD .  acetaminophen (TYLENOL) tablet 650 mg, 650 mg, Oral, Q4H PRN, Opyd, Timothy S, MD .  aspirin EC tablet 81 mg, 81 mg, Oral, Daily, Opyd, Lavone Neri, MD, 81 mg at 03/20/20 1019 .  heparin injection 5,000 Units, 5,000 Units, Subcutaneous, Q8H, Opyd, Lavone Neri, MD, 5,000 Units at 03/20/20 0547 .  levothyroxine (SYNTHROID) tablet 100 mcg, 100 mcg, Oral, Q0600, Opyd, Lavone Neri, MD, 100 mcg at 03/20/20 0547 .  ondansetron (ZOFRAN) injection 4 mg, 4 mg, Intravenous, Q6H PRN, Opyd, Lavone Neri, MD, 4 mg at 03/20/20 1019 .  pantoprazole (PROTONIX) EC tablet 40 mg, 40 mg, Oral, Daily, Opyd, Lavone Neri, MD, 40 mg at 03/20/20 1019 .  sodium chloride flush (NS) 0.9 % injection 3 mL, 3 mL, Intravenous, Q12H, Opyd, Lavone Neri, MD, 3 mL at 03/20/20 0039 .  sodium chloride flush (NS) 0.9 % injection 3 mL, 3 mL, Intravenous, PRN, Opyd, Timothy S, MD  ROS:    General ROS: Positive for -  Fatigue, weight loss Psychological ROS: negative for - behavioral disorder, hallucinations, memory difficulties, mood swings or suicidal ideation Ophthalmic ROS: Positive for - blurry vision ENT ROS: negative for - epistaxis, nasal  discharge, oral lesions, sore throat, tinnitus or vertigo Allergy and Immunology ROS: negative for - hives or itchy/watery eyes Hematological and Lymphatic ROS: negative for - bleeding problems, bruising or swollen lymph nodes Endocrine ROS: negative for - galactorrhea, hair pattern changes, polydipsia/polyuria or temperature intolerance Respiratory ROS: negative for - cough, hemoptysis, shortness of breath or wheezing Cardiovascular ROS: negative for - chest  pain, dyspnea on exertion, edema or irregular heartbeat Gastrointestinal ROS: Positive for - nausea/vomiting  Genito-Urinary ROS: negative for - dysuria, hematuria, incontinence or urinary frequency/urgency Musculoskeletal ROS: Positive for - joint swelling or muscular weakness Neurological ROS: as noted in HPI Dermatological ROS: negative for rash and skin lesion changes  Exam: Current vital signs: BP 90/64 (BP Location: Right Arm)   Pulse 96   Temp 98.2 F (36.8 C) (Oral)   Resp 20   Ht 5\' 2"  (1.575 m)   Wt 55.4 kg   SpO2 97%   BMI 22.35 kg/m  Vital signs in last 24 hours: Temp:  [97.9 F (36.6 C)-98.5 F (36.9 C)] 98.2 F (36.8 C) (09/08 1118) Pulse Rate:  [86-96] 96 (09/08 1118) Resp:  [14-25] 20 (09/08 1118) BP: (90-104)/(60-75) 90/64 (09/08 1118) SpO2:  [90 %-100 %] 97 % (09/08 1118) Weight:  [55.4 kg-56.1 kg] 55.4 kg (09/08 0526)   Constitutional: Cachectic Psych: Affect appropriate to situation Eyes: No scleral injection HENT: No OP obstrucion Head: Normocephalic.  Cardiovascular: Normal rate and regular rhythm.  Respiratory: Effort normal, non-labored breathing GI: Soft.  No distension. There is no tenderness.  Skin: WDI, thin  Neuro: Mental Status: Patient is awake, alert, oriented to person, place, month, believes it is 2012, and situation. Speech-shows no dysarthria or aphasia, she has intact naming, repeating and comprehension.  She follows all commands with no difficulties however she is hard of hearing thus at times the command had to be repeated Cranial Nerves: II: Visual Fields are full.  III,IV, VI: EOMI without ptosis or diploplia. Pupils equal, round and reactive to light V: Facial sensation is symmetric to temperature VII: Facial movement is symmetric.  VIII: Decreased hearing bilaterally X: Palat elevates symmetrically XI: Shoulder shrug is symmetric. XII: tongue is midline without atrophy or fasciculations.  Motor: Tone is normal. Bulk is  decreased. 5/5 strength was present in all four extremities.  No drift Sensory: Sensation is symmetric to light touch and temperature in the arms and legs. DSS Plantars: Mute bilaterally Cerebellar: Finger-nose-finger showed no dysmetria   Labs I have reviewed labs in epic and the results pertinent to this consultation are:  CBC    Component Value Date/Time   WBC 9.5 03/20/2020 0035   RBC 2.88 (L) 03/20/2020 0035   HGB 8.3 (L) 03/20/2020 0035   HCT 27.6 (L) 03/20/2020 0035   PLT 244 03/20/2020 0035   MCV 95.8 03/20/2020 0035   MCH 28.8 03/20/2020 0035   MCHC 30.1 03/20/2020 0035   RDW 17.2 (H) 03/20/2020 0035   LYMPHSABS 1.7 03/19/2020 1644   MONOABS 0.9 03/19/2020 1644   EOSABS 0.1 03/19/2020 1644   BASOSABS 0.0 03/19/2020 1644    CMP     Component Value Date/Time   NA 139 03/20/2020 0035   K 4.4 03/20/2020 0035   CL 102 03/20/2020 0035   CO2 25 03/20/2020 0035   GLUCOSE 124 (H) 03/20/2020 0035   BUN 42 (H) 03/20/2020 0035   CREATININE 1.86 (H) 03/20/2020 0035   CALCIUM 8.6 (L) 03/20/2020 0035   PROT 6.7 03/19/2020 1644   ALBUMIN 3.1 (  L) 03/19/2020 1644   AST 22 03/19/2020 1644   ALT 8 03/19/2020 1644   ALKPHOS 56 03/19/2020 1644   BILITOT 1.2 03/19/2020 1644   GFRNONAA 23 (L) 03/20/2020 0035   GFRAA 27 (L) 03/20/2020 0035   Echocardiogram on 03/13/2020-left ventricular ejection fraction estimated to be 40 to 45% with left ventricle showing mild decreased function.  Left ventricle demonstrates global hypokinesis.  Imaging I have reviewed the images obtained:  MRI examination of the brain-small acute to subacute infarcts along the bilateral cerebral cortex, likely to be embolic.  EEG-study was within normal limits.  No seizure or epileptiform discharges were seen throughout the recording  LDL 75 obtained on 03/15/2020  Hemoglobin A1c 4.8 obtained 6 days ago  Felicie Morn PA-C [facility] [phone]  M-F  (9:00 am- 5:00 PM)  03/20/2020, 11:31  AM   Attending addendum I seen and examined the patient independently History obtained independently Review of systems brain independently Imaging reviewed independently  Assessment: This is a 86 year old female presenting to the hospital secondary to hypotension, nausea vomiting and possible seizure activity.  EEG was negative however MRI did show small acute to subacute infarcts bilaterally most likely embolic.  1 of these infarctions in the left cortical area, which could have also led to seizure activity. Patient also recently showed severe aortic stenosis along with cardiac ejection fraction of 40 to 45% and global hypokinesis of the left ventricle.  Patient's bilateral embolic strokes likely secondary to severe aortic stenosis versus significant hypokinesis of left ventricle with EF of 40 to 45%.  Impression: -Bilateral embolic strokes -Seizure-like activity  Recommendations: -As patient's creatinine is 1.86 would recommend MRA head along with MRA neck without contrast -Further evaluate patient's severe aortic stenosis -Continue aspirin 81 mg -Start Lipitor 80 mg daily -No need for antiepileptics-seizure might have been secondary to the cortical stroke.  It has another seizure episode, will initiate antiepileptics. -Maintain seizure precautions -Continue frequent neurochecks and telemetry -PT OT speech therapy -N.p.o. until cleared by bedside stroke swallow screen. Stroke team will follow with you  -- Milon Dikes, MD [facility] Pager: [phone] If 7pm to 7am, please call on call as listed on AMION.

## 2020-03-20 NOTE — Consult Note (Addendum)
Cardiology Consultation:   Patient ID: Morgan Holland MRN: [id number]; DOB: 1927/04/08  Admit date: 03/19/2020 Date of Consult: 03/20/2020  Primary Care Provider: Ardyth Gal, MD [facility] Cardiologist: Chilton Si, MD   [facility] Electrophysiologist:  None    Patient Profile:   Morgan Holland is a 86 y.o. female with a hx of HTN, SVT, vertigo, AS s/p AVR who is being seen today for the evaluation of CHF and elevated troponin at the request of Dr. Jonathon Bellows .  History of Present Illness:   Morgan Holland has a history of AS s/p AVR in 2014. She was recently admitted to [facility] 03/14/20 with weakness, dyspnea, and chest pain. Echocardiogram at that time showed degenerative mitral valve disease with mild to moderate mitral stenosis and severe prosthetic aortic valve stenosis. The structural heart team was consulted and determined she was a candidate for valve-in-valve TAVR and recommended heart cath and GI workup with endoscopy if indicated for her anemia. Wanted to show stable CBC first.  Echo also revealed EF of 40-45% with global hypokinesis and mild LVH. She was diuresed with IV lasix. Course was complicated by acute on chronic anemia with admission Hb 7.1 treated with transfusion. Demand ischemia also noted with hs troponin 182. She was discharged on 03/16/20.  She unfortunately presented back to [facility] 03/19/20 with hypotension and possible seizure activity. Neurology was consulted. EEG was negative for seizure activity. MRI brain revealed small bilateral acute to subacute infarcts, likely embolic, along with advanced chronic small vessel disease. Neurology felt that one of the infarctions in the left cortical area could have led to seizure activity.   HS troponin 796 --> 769 BNP 4372 EKG with TWI V4/5/6 CXR with small bilateral pleural effusions COVID-19 negative  Cardiology was consulted for CHF exacerbation and elevated troponin. During my interview, she is very hard of hearing and somewhat  difficult historian. She has been short of breath and weak for the last 4-5 months with orthopnea. She does state she feels worse with increased shortness of breath over the past 2-3 days. However, her main complaint seems to focus on her vertigo and somewhat chronic nausea since her COVID infection in Dec.  She denies bleeding.    Past Medical History:  Diagnosis Date  . Aortic stenosis, severe   . Hypertension   . MI (myocardial infarction) (HCC)   . S/P AVR   . Thyroid disease     Past Surgical History:  Procedure Laterality Date  . AORTIC VALVE REPLACEMENT       Home Medications:  Prior to Admission medications   Medication Sig Start Date End Date Taking? Authorizing Provider  acetaminophen (TYLENOL) 325 MG tablet Take 650 mg by mouth every 6 (six) hours as needed for headache (pain).   Yes [provider]  aspirin EC 81 MG tablet Take 81 mg by mouth daily.    Yes [provider]  ferrous sulfate 325 (65 FE) MG EC tablet Take 1 tablet by mouth 2 (two) times daily. 05/10/19  Yes [provider]  folic acid (FOLVITE) 1 MG tablet Take 1 tablet (1 mg total) by mouth daily. 03/16/20  Yes Calvert Cantor, MD  furosemide (LASIX) 40 MG tablet Take 1 tablet (40 mg total) by mouth daily. 03/16/20 03/16/21 Yes Calvert Cantor, MD  levothyroxine (SYNTHROID) 100 MCG tablet Take 100 mcg by mouth every morning. 03/04/20  Yes [provider]  meclizine (ANTIVERT) 12.5 MG tablet Take 1 tablet (12.5 mg total) by mouth 3 (three) times daily  as needed for dizziness. 06/16/19  Yes Dorcas Carrow, MD  metoprolol tartrate (LOPRESSOR) 25 MG tablet Take 0.5 tablets (12.5 mg total) by mouth 2 (two) times daily. 03/16/20  Yes Calvert Cantor, MD  Multiple Vitamin (MULTIVITAMIN) capsule Take 1 capsule by mouth daily.   Yes [provider]  omeprazole (PRILOSEC) 40 MG capsule Take 40 mg by mouth 2 (two) times daily.  04/06/19  Yes [provider]  ondansetron (ZOFRAN-ODT) 8  MG disintegrating tablet Take 8 mg by mouth every 12 (twelve) hours as needed for nausea. 11/23/19  Yes [provider]  polyethylene glycol (MIRALAX / GLYCOLAX) 17 g packet Take 17 g by mouth daily. 03/16/20  Yes Calvert Cantor, MD  potassium chloride (KLOR-CON) 10 MEQ tablet Take 2 tablets (20 mEq total) by mouth daily. 03/16/20  Yes Calvert Cantor, MD  triamcinolone cream (KENALOG) 0.1 % Apply 1 application topically See admin instructions. Apply topically to chin daily as needed for itching/rash 06/20/15  Yes [provider]  vitamin B-12 1000 MCG tablet Take 1 tablet (1,000 mcg total) by mouth daily. 03/16/20  Yes Calvert Cantor, MD    Inpatient Medications: Scheduled Meds: . aspirin EC  81 mg Oral Daily  . atorvastatin  80 mg Oral Daily  . heparin  5,000 Units Subcutaneous Q8H  . levothyroxine  100 mcg Oral Q0600  . pantoprazole  40 mg Oral Daily  . sodium chloride flush  3 mL Intravenous Q12H   Continuous Infusions: . sodium chloride    . sodium chloride 50 mL/hr at 03/20/20 1546   PRN Meds: sodium chloride, acetaminophen, ondansetron (ZOFRAN) IV, sodium chloride flush  Allergies:   No Known Allergies  Social History:   Social History   Socioeconomic History  . Marital status: Widowed    Spouse name: Not on file  . Number of children: Not on file  . Years of education: Not on file  . Highest education level: Not on file  Occupational History  . Not on file  Tobacco Use  . Smoking status: Never Smoker  . Smokeless tobacco: Never Used  Substance and Sexual Activity  . Alcohol use: Not Currently  . Drug use: Never  . Sexual activity: Not on file  Other Topics Concern  . Not on file  Social History Narrative  . Not on file   Social Determinants of Health   Financial Resource Strain:   . Difficulty of Paying Living Expenses: Not on file  Food Insecurity:   . Worried About Programme researcher, broadcasting/film/video in the Last Year: Not on file  . Ran Out of Food in the Last  Year: Not on file  Transportation Needs:   . Lack of Transportation (Medical): Not on file  . Lack of Transportation (Non-Medical): Not on file  Physical Activity:   . Days of Exercise per Week: Not on file  . Minutes of Exercise per Session: Not on file  Stress:   . Feeling of Stress : Not on file  Social Connections:   . Frequency of Communication with Friends and Family: Not on file  . Frequency of Social Gatherings with Friends and Family: Not on file  . Attends Religious Services: Not on file  . Active Member of Clubs or Organizations: Not on file  . Attends Banker Meetings: Not on file  . Marital Status: Not on file  Intimate Partner Violence:   . Fear of Current or Ex-Partner: Not on file  . Emotionally Abused: Not on file  .  Physically Abused: Not on file  . Sexually Abused: Not on file    Family History:    Family History  Problem Relation Age of Onset  . Cancer Mother   . Heart attack Father      ROS:  Please see the history of present illness.   All other ROS reviewed and negative.     Physical Exam/Data:   Vitals:   03/19/20 2100 03/19/20 2231 03/20/20 0526 03/20/20 1118  BP: 92/62 96/65 96/68  90/64  Pulse: 86 89 93 96  Resp: 18 17 18 20   Temp:  98.5 F (36.9 C) 97.9 F (36.6 C) 98.2 F (36.8 C)  TempSrc:  Oral Oral Oral  SpO2: 95% 96% 98% 97%  Weight:   55.4 kg   Height:        Intake/Output Summary (Last 24 hours) at 03/20/2020 1659 Last data filed at 03/20/2020 0700 Gross per 24 hour  Intake 611.71 ml  Output --  Net 611.71 ml   Last 3 Weights 03/20/2020 03/19/2020 03/16/2020  Weight (lbs) 122 lb 3.2 oz 123 lb 10.9 oz 123 lb 11.2 oz  Weight (kg) 55.43 kg 56.1 kg 56.11 kg     Body mass index is 22.35 kg/m.  General:  Elderly female in NAD HEENT: normal Neck: mild JVD Vascular: No carotid bruits  Cardiac:  normal S1, S2; regular rhythm, regular rate, +murmur Lungs: diminished in bases, occasional crackles Abd: soft, nontender, no  hepatomegaly  Ext: minimal edema Musculoskeletal:  No deformities, BUE and BLE strength normal and equal Skin: warm and dry  Neuro:  CNs 2-12 intact, no focal abnormalities noted Psych:  Normal affect   EKG:  The EKG was personally reviewed and demonstrates:  Sinus rhythm with HR 95 with TWI in V4/5/6 Telemetry:  Telemetry was personally reviewed and demonstrates:  Sinus rhythm in the 90s  Relevant CV Studies:  Echo 03/13/20: 1. Left ventricular ejection fraction, by estimation, is 40 to 45%. The  left ventricle has mildly decreased function. The left ventricle  demonstrates global hypokinesis. There is mild concentric left ventricular  hypertrophy. Left ventricular diastolic  function could not be evaluated.  2. Right ventricular systolic function is mildly reduced. The right  ventricular size is mildly enlarged. There is normal pulmonary artery  systolic pressure.  3. Left atrial size was severely dilated.  4. Right atrial size was moderately dilated.  5. There is severe thickening and calcification of the subvalvular mitral  apparatus. There is severe annular calcification. The mitral valve has  both degenerative and rheumatic features. Moderate mitral valve  regurgitation. Mild to moderate mitral  stenosis. The mean mitral valve gradient is 6.0 mmHg with average heart  rate of 88 bpm.  6. The aortic valve is tricuspid. Aortic valve regurgitation is mild to  moderate. Severe aortic valve stenosis. Aortic valve Vmax measures 4.38  m/s.   Laboratory Data:  High Sensitivity Troponin:   Recent Labs  Lab 03/12/20 1749 03/13/20 0508 03/13/20 0719 03/19/20 2032 03/20/20 0035  TROPONINIHS 176* 169* 182* 796* 769*     Chemistry Recent Labs  Lab 03/16/20 0822 03/19/20 1644 03/20/20 0035  NA 134* 134* 139  K 3.9 4.7 4.4  CL 99 94* 102  CO2 25 24 25   GLUCOSE 130* 117* 124*  BUN 24* 47* 42*  CREATININE 1.51* 1.82* 1.86*  CALCIUM 8.7* 8.8* 8.6*  GFRNONAA 30* 24* 23*   GFRAA 35* 28* 27*  ANIONGAP 10 16* 12    Recent Labs  Lab  03/19/20 1644  PROT 6.7  ALBUMIN 3.1*  AST 22  ALT 8  ALKPHOS 56  BILITOT 1.2   Hematology Recent Labs  Lab 03/16/20 0827 03/19/20 1644 03/20/20 0035  WBC 10.3 8.8 9.5  RBC 3.38* 3.18* 2.88*  HGB 9.7* 9.4* 8.3*  HCT 32.0* 30.4* 27.6*  MCV 94.7 95.6 95.8  MCH 28.7 29.6 28.8  MCHC 30.3 30.9 30.1  RDW 17.1* 17.2* 17.2*  PLT 326 255 244   BNP Recent Labs  Lab 03/19/20 1643  BNP 4,371.6*    DDimer No results for input(s): DDIMER in the last 168 hours.   Radiology/Studies:  CT Head Wo Contrast  Result Date: 03/19/2020 CLINICAL DATA:  86 year old female recently hospitalized with CHF. Seizure last night. EXAM: CT HEAD WITHOUT CONTRAST TECHNIQUE: Contiguous axial images were obtained from the base of the skull through the vertex without intravenous contrast. COMPARISON:  None. FINDINGS: Brain: No midline shift, mass effect, or evidence of intracranial mass lesion. No acute intracranial hemorrhage identified. No ventriculomegaly. Patchy and confluent widespread cerebral white matter hypodensity, including deep white matter capsule involvement which appears greater in the left hemisphere. Comparatively mild heterogeneity in the bilateral deep gray nuclei. Small chronic appearing infarct of the right superior cerebellum. Occasional small areas of cortical encephalomalacia, including in the left middle frontal gyrus on coronal image 23. No cortically based acute infarct identified. Vascular: Calcified atherosclerosis at the skull base. Skull: No acute osseous abnormality identified. Sinuses/Orbits: Mild bubbly opacity in the right sphenoid. Other Visualized paranasal sinuses and mastoids are clear. Other: No acute orbit or scalp soft tissue finding. IMPRESSION: Advanced chronic ischemic disease with no acute intracranial abnormality by CT. Electronically Signed   By: Odessa Fleming M.D.   On: 03/19/2020 17:00   MR ANGIO HEAD WO  CONTRAST  Result Date: 03/20/2020 CLINICAL DATA:  Neuro deficit, acute, stroke suspected. EXAM: MRA HEAD WITHOUT CONTRAST TECHNIQUE: Angiographic images of the Circle of Willis were obtained using MRA technique without intravenous contrast. COMPARISON:  Brain MRI 03/20/2020, head CT 03/19/2020 FINDINGS: The intracranial internal carotid arteries are patent. The M1 middle cerebral arteries are patent without significant stenosis. No M2 proximal branch occlusion or high-grade proximal stenosis is identified. The anterior cerebral arteries are patent. The visualized intracranial vertebral arteries are patent. The intracranial left vertebral artery is dominant. The basilar artery is patent. The posterior cerebral arteries are patent proximally without significant stenosis. Posterior communicating arteries are hypoplastic or absent bilaterally. No intracranial aneurysm is identified. IMPRESSION: No intracranial large vessel occlusion or proximal high-grade stenosis. Electronically Signed   By: Jackey Loge DO   On: 03/20/2020 15:40   MR ANGIO NECK WO CONTRAST  Result Date: 03/20/2020 CLINICAL DATA:  Neuro deficit, acute, stroke suspected. EXAM: MRA NECK WITHOUT CONTRAST TECHNIQUE: Angiographic images of the neck were obtained using MRA technique without intravenous contrast. Carotid stenosis measurements (when applicable) are obtained utilizing NASCET criteria, using the distal internal carotid diameter as the denominator. COMPARISON:  Noncontrast brain MRI 03/20/2020, concurrently performed MRA head 03/20/2020, head CT 03/19/2020. FINDINGS: Aberrant right subclavian artery. Motion degradation and non-contrast technique precludes adequate evaluation for stenosis within the proximal right subclavian artery. No hemodynamically significant stenosis of the proximal left subclavian artery. The bilateral common and internal carotid arteries are patent within the neck without evidence of hemodynamically significant stenosis  (50% or greater). There is atherosclerotic plaque within both carotid bifurcations and proximal internal carotid arteries. Non-contrast technique and motion artifact precludes adequate evaluation of the origins of the  vertebral arteries. Within this limitation, the vertebral arteries are patent within the neck bilaterally with antegrade flow and without appreciable significant stenosis. The left vertebral artery is dominant. IMPRESSION: The bilateral common and internal carotid arteries are patent within the neck without evidence of hemodynamically significant stenosis. Atherosclerotic plaque is present within both carotid bifurcations and proximal internal carotid arteries. Non-contrast technique and motion artifact precludes adequate evaluation of the origins of the vertebral arteries. Within this limitation, the cervical vertebral arteries are patent within the neck without appreciable significant stenosis. Aberrant right subclavian artery. Electronically Signed   By: Jackey Loge DO   On: 03/20/2020 15:51   MR BRAIN WO CONTRAST  Result Date: 03/20/2020 CLINICAL DATA:  Nontraumatic seizure. Recent hypertensive episode and admission for CHF. EXAM: MRI HEAD WITHOUT CONTRAST TECHNIQUE: Multiplanar, multiecho pulse sequences of the brain and surrounding structures were obtained without intravenous contrast. COMPARISON:  Head CT from yesterday FINDINGS: Brain: Punctate acute to subacute infarcts along the bilateral cerebral convexity and right caudate body, roughly along the cerebral watershed territories. A small more linear subacute appearing infarct is seen along the high left frontal cortex. Confluent chronic small vessel ischemic gliosis in the cerebral white matter. Small remote bilateral cerebellar infarcts. Remote cerebral hemispheric infarcts. Age normal brain volume. No acute hemorrhage, hydrocephalus, or masslike finding. There have been a few remote microhemorrhages with nonspecific pattern given the  limited degree. Vascular: Normal flow voids Skull and upper cervical spine: Normal marrow signal Sinuses/Orbits: Bilateral cataract resection. Small proteinaceous nasopharyngeal cyst on the right. IMPRESSION: 1. Small acute to subacute infarcts along the bilateral cerebral cortex, likely embolic. 2. Background of advanced chronic small vessel disease. Electronically Signed   By: Marnee Spring M.D.   On: 03/20/2020 04:16   DG Chest Port 1 View  Result Date: 03/19/2020 CLINICAL DATA:  Low blood pressure. EXAM: PORTABLE CHEST 1 VIEW COMPARISON:  A 08/12/2019 FINDINGS: The heart size remains enlarged. The patient is status post prior median sternotomy. There are hazy bilateral airspace opacities with prominent interstitial lung markings. Aortic calcifications are again noted. There is no pneumothorax. There is no acute osseous abnormality. There may be small bilateral pleural effusions. IMPRESSION: Cardiomegaly with findings of congestive heart failure. Electronically Signed   By: Katherine Mantle M.D.   On: 03/19/2020 17:00   EEG adult  Result Date: 03/20/2020 Charlsie Quest, MD     03/20/2020 11:02 AM Patient Name: Sanyla Summey MRN: [id number] Epilepsy Attending: Charlsie Quest Referring Physician/Provider: Dr Odie Sera Date: 03/20/2020 Duration: 23.17 mins Patient history: 86 year old female with seizure-like activity.  EEG to evaluate for seizures. Level of alertness: Awake, asleep AEDs during EEG study: None Technical aspects: This EEG study was done with scalp electrodes positioned according to the 10-20 International system of electrode placement. Electrical activity was acquired at a sampling rate of 500Hz  and reviewed with a high frequency filter of 70Hz  and a low frequency filter of 1Hz . EEG data were recorded continuously and digitally stored. Description: The posterior dominant rhythm consists of 8-9 Hz activity of moderate voltage (25-35 uV) seen predominantly in posterior head regions,  symmetric and reactive to eye opening and eye closing. Sleep was characterized by vertex waves, sleep spindles (12 to 14 Hz), maximal frontocentral region.    Hyperventilation and photic stimulation were not performed.   IMPRESSION: This study is within normal limits. No seizures or epileptiform discharges were seen throughout the recording. Priyanka O Yadav       TIMI Risk Score for  Unstable Angina or Non-ST Elevation MI:   The patient's TIMI risk score is 2, which indicates a 8% risk of all cause mortality, new or recurrent myocardial infarction or need for urgent revascularization in the next 14 days.   New York Heart Association (NYHA) Functional Class NYHA Class II  Assessment and Plan:   Acute on chronic systolic heart failure - BNP 4372 (3111) - EF 40-45% with global hypokinesis and mild LVH - was 56.1 kg at recent discharge - weight today is 55.4 kg - she is mildly volume up, I do not think she has many reserves for volume overload given her anemia and valvular disease - BNP slightly up from recent discharge - was discharged on 40 mg lasix - will order limited echo to make sure her EF has not deteriorated further - agree with gentle IV lasix   Severe prosthetic aortic valve stenosis Mitral stenosis - s/p bioprosthetic AVR in 2014 - mean gradient of 50 mmHg - has been evaluated by structural heart team and planned for OP workup if anemia stable   Elevated troponin - hs trop 796 --> 769 - pt does report chest cramping earlier today during her trips to MRI - I suspect his is continued demand ischemia in the setting of low EF, anemia, and valvular disease - needs right and left heart cath for evaluation of valve-in-valve treatment, but recommend stabilization of her anemia first   Anemia - Hb 8.3 (9.4) - was 9.7 at recent discharge - per primary  - given her prosthetic valve stenosis, she will likely not tolerate Hb   Acute on chronic renal insufficiency stage III -  sCr 1.86 (1.82), baseline previously 1.00 - discharged at 1.51 on 03/16/20   Difficult situation. Barriers to moving forward with TAVR evaluation, including heart cath, are her anemia. Would recommend GI consult and palliative/goals of care discussion.    For questions or updates, please contact [facility] Please consult [email or web address] for contact info under    Signed, Marcelino Duster, [place]  03/20/2020 4:59 PM   Patient seen and examined with Bettina Gavia PA.  Agree as above, with the following exceptions and changes as noted below.  This is a 86 year old female with a history of aortic stenosis status post AVR now found to have severe prosthetic aortic valve stenosis.  She also has symptomatic anemia.  Seen previously by structural cardiology service on last admission for possible valve in valve TAVR.  Barriers to proceeding with TAVR included further evaluation of anemia.  Patient tells me today that she feels very weak and has significant nausea.  She would like to try to drink something but is not hungry.  Gen: NAD, CV: 3/6 SEM lungs: clear, Abd: soft, Extrem: Warm, well perfused, no edema, Neuro/Psych: She is alert and oriented, but has some repetitive speech and some difficulty following the conversation.  Very hard of hearing.  All available labs, radiology testing, previous records reviewed.   Medically complex situation.  Her symptoms are almost certainly related to symptomatic anemia and severe prosthetic aortic valve stenosis.  However to proceed with further work-up of valve in valve TAVR, definitive evaluation of anemia may be required.  Would recommend involving GI service to discuss whether endoscopic evaluation is warranted.  We had a frank discussion about natural history of degeneration of aortic valve prostheses.  I let her know that she will continue to be symptomatic if we are unable to relieve the obstruction of her aortic valve prosthesis.  In  this challenging situation, would  strongly recommend early involvement of palliative care for symptom management and goals of care.  She may need IV Lasix as needed, but would use with caution if anemia worsens and a bleeding source is revealed.  Avoid pure vasodilators.,  Would transfuse if hemoglobin less than 8.  Parke PoissonGayatri A Uchechukwu Dhawan, MD 03/20/20 6:29 PM

## 2020-03-20 NOTE — Procedures (Signed)
Patient Name: Morgan Holland  MRN: [id number]  Epilepsy Attending: Charlsie Quest  Referring Physician/Provider: Dr Odie Sera Date: 03/20/2020  Duration: 23.17 mins  Patient history: 86 year old female with seizure-like activity.  EEG to evaluate for seizures.  Level of alertness: Awake, asleep  AEDs during EEG study: None  Technical aspects: This EEG study was done with scalp electrodes positioned according to the 10-20 International system of electrode placement. Electrical activity was acquired at a sampling rate of 500Hz  and reviewed with a high frequency filter of 70Hz  and a low frequency filter of 1Hz . EEG data were recorded continuously and digitally stored.   Description: The posterior dominant rhythm consists of 8-9 Hz activity of moderate voltage (25-35 uV) seen predominantly in posterior head regions, symmetric and reactive to eye opening and eye closing. Sleep was characterized by vertex waves, sleep spindles (12 to 14 Hz), maximal frontocentral region.    Hyperventilation and photic stimulation were not performed.     IMPRESSION: This study is within normal limits. No seizures or epileptiform discharges were seen throughout the recording.  Yancey Pedley 

## 2020-03-20 NOTE — Progress Notes (Signed)
PROGRESS NOTE    Morgan Holland  DGU:[id number] DOB: Jul 30, 1926 DOA: 03/19/2020 PCP: Ardyth Gal, MD   Chief Complaint  Patient presents with  . Hypotension  Brief Narrative:As per HPI:" 86 y.o. female with medical history significant for recent diagnosis of systolic CHF with EF 40 to 45%, severe aortic stenosis, hx of covid, chronic kidney disease, chronic anemia, hypothyroidism, and hypertension, now presenting to the emergency department with low blood pressures and seizure-like episode.  Patient was discharged from the hospital on 03/16/2020 after admission for newly diagnosed CHF.  Since returning home, her systolic blood pressure has been in the 80s much of the time and her granddaughter whom she lives with has been holding her metoprolol.  Patient complains of intermittent nausea without vomiting and states that she has been experiencing this ever since she was hospitalized with Covid last December.  She denies any chest pain or palpitations, has not noticed any leg swelling, and does not feel particularly short of breath.  Per report of her granddaughter, the patient was preparing to eat lunch on 03/18/2020 when she began shaking all over and would not respond to questioning.  This lasted 15 to 20 seconds and was followed by a period of somnolence.  Granddaughter has a child with epilepsy and states that this looked just like generalized seizures she has seen before along with a postictal period.  Patient reports that she was experiencing nausea prior to the episode, but again states that she has had this frequently for the past several months.She has never had any seizure-like episodes previously per family report.  [facility] ED Course:Upon arrival to the ED, patient is found to be afebrile, saturating low 90s on room air, and with blood pressure 90/70.  EKG features sinus rhythm with T wave inversions.  Head CT is notable for advanced chronic ischemic disease but no acute intracranial abnormality.  Chest  x-ray with cardiomegaly, prominent interstitial markings, and possible small bilateral pleural effusions.  Chemistry panel reveals a creatinine 1.87, up from 1.51 two days earlier.CBC notable for stable normocytic anemia.  Troponin is elevated to 796 and BNP is elevated 4372.Covid PCR is negative.  Cardiology was consulted and was admitted" PT would not come on Monday and BP was low in 80/50 and feeling bad at home so granddaughter brought to hospitals.  Subjective:  Heard of hearing. No chest pain or shortness of breath alert awake oriented to place, not to date Moving all her extremities well reports her mind is not as clear some nausea this am and c/o vertigo reports she was shaking at home  Assessment & Plan:  Acute on chronic systolic CHF: bnp was 4371. Cards consulted in ED and lasix on hold 2/2 AKI.  Imaging showed congestive heart failure findings but on exam appears to stable. Monitor volume status. Her weight is lower than her last weight. Hopefully can handle ivf for soft bp and AKI Wt Readings from Last 3 Encounters:  03/20/20 55.4 kg  03/16/20 56.1 kg  06/12/19 62.5 kg   AKI on CKD IIIb:Creat 1,8>1.8,was 1.3-1.5 on last d/c on 9.4.21.lasix on hold. Add ivg gentle if intake not well and still low bp. Recent Labs  Lab 03/14/20 0437 03/15/20 0442 03/16/20 0822 03/19/20 1644 03/20/20 0035  BUN 28* 25* 24* 47* 42*  CREATININE 1.28* 1.32* 1.51* 1.82* 1.86*   Small bilateral acute to subacute infarcts: She had  observed seizure-like activityat home- for which underwent  MRI showed "Small acute to subacute infarcts along the bilateral  cerebral cortex, likely embolic. 2. Background of advanced chronic small vessel disease" consulted neurology.  Continue aspirin 81, check ldl, tsh, hba1c,pt/ot eval. Add lipitor.  Hypotension/Hx of ZOX:WRUEA pressure soft in 90s.  Lasix, metoprolol remains on hold. Will add ivf x 6 hrs.  Elevated troponin:796>769.No chest pain.Cardio has been  consulted.  Continue aspirin 81.  Hypothyroidism:Resume home Synthroid.  Severe aortic stenosis,followed by cardiology.  Iron deficiency anemia:H&H slightly downtrending, monitor. Recent Labs  Lab 03/15/20 0442 03/16/20 0827 03/19/20 1644 03/20/20 0035  HGB 8.8* 9.7* 9.4* 8.3*  HCT 28.9* 32.0* 30.4* 27.6*   DVT prophylaxis: heparin injection 5,000 Units Start: 03/20/20 0015 Code Status:  Code Status: Full Code spoke w granddaughter and she will discuss code status w/ patient today evening Family Communication: plan of care discussed with patient at bedside.  I called and updated patient's granddaughter over the phone.  Status is: Inpatient  Remains inpatient appropriate because:Ongoing diagnostic testing needed not appropriate for outpatient work up, IV treatments appropriate due to intensity of illness or inability to take PO and Inpatient level of care appropriate due to severity of illness   Dispo: The patient is from: Home              Anticipated d/c is to: Home              Anticipated d/c date is1-2 days              Patient currently is not medically stable to d/c.  Nutrition: Diet Order            Diet Heart Room service appropriate? Yes; Fluid consistency: Thin; Fluid restriction: 1500 mL Fluid  Diet effective now                  Body mass index is 22.35 kg/m. Pressure Ulcer: Pressure Injury 03/19/20 Sacrum Medial Stage 2 -  Partial thickness loss of dermis presenting as a shallow open injury with a red, pink wound bed without slough. (Active)  03/19/20 2315  Location: Sacrum  Location Orientation: Medial  Staging: Stage 2 -  Partial thickness loss of dermis presenting as a shallow open injury with a red, pink wound bed without slough.  Wound Description (Comments):   Present on Admission: Yes    Consultants:see note  Procedures:see note Microbiology:see note Blood Culture    Component Value Date/Time   SDES  03/12/2020 2354    URINE,  RANDOM Performed at [facility], [address] [place], [place] [address]    [facility]  03/12/2020 2354    NONE Performed at [facility], [address] [place], [place] [address]    CULT MULTIPLE SPECIES PRESENT, SUGGEST RECOLLECTION (A) 03/12/2020 2354   REPTSTATUS 03/13/2020 FINAL 03/12/2020 2354    Other culture-see note  Medications: Scheduled Meds: . aspirin EC  81 mg Oral Daily  . heparin  5,000 Units Subcutaneous Q8H  . levothyroxine  100 mcg Oral Q0600  . pantoprazole  40 mg Oral Daily  . sodium chloride flush  3 mL Intravenous Q12H   Continuous Infusions: . sodium chloride      Antimicrobials: Anti-infectives (From admission, onward)   None     Objective: Vitals: Today's Vitals   03/19/20 2100 03/19/20 2231 03/19/20 2354 03/20/20 0526  BP: 92/62 96/65  96/68  Pulse: 86 89  93  Resp: Temp:  98.5 F (36.9 C)  97.9 F (36.6 C)  TempSrc:  Oral  Oral  SpO2: 95% 96%  98%  Weight:    55.4 kg  Height:      PainSc: 0-No pain  0-No pain     Intake/Output Summary (Last 24 hours) at 03/20/2020 1117 Last data filed at 03/20/2020 0700 Gross per 24 hour  Intake 611.71 ml  Output --  Net 611.71 ml   Filed Weights   03/19/20 1404 03/20/20 0526  Weight: 56.1 kg 55.4 kg   Weight change:   Intake/Output from previous day: 09/07 0701 - 09/08 0700 In: 611.7 [P.O.:360; I.V.:251.7] Out: -  Intake/Output this shift: No intake/output data recorded.  Examination: General exam: AAO ,NAD, weak appearing. HEENT:Oral mucosa moist, Ear/Nose WNL grossly,dentition normal. Respiratory system: bilaterally  clear,no wheezing or crackles,no use of accessory muscle, non tender. Cardiovascular system: S1 & S2 +, regular, No JVD. Gastrointestinal system: Abdomen soft, NT,ND, BS+. Nervous System:Alert, awake, moving extremities and grossly nonfocal Extremities: No edema, distal peripheral pulses palpable.  Skin: No rashes,no icterus. MSK:  Normal muscle bulk,tone, power  Data Reviewed: I have personally reviewed following labs and imaging studies CBC: Recent Labs  Lab 03/15/20 0442 03/16/20 0827 03/19/20 1644 03/20/20 0035  WBC 8.5 10.3 8.8 9.5  NEUTROABS  --   --  6.0  --   HGB 8.8* 9.7* 9.4* 8.3*  HCT 28.9* 32.0* 30.4* 27.6*  MCV 93.2 94.7 95.6 95.8  PLT 267 326 255 244   Basic Metabolic Panel: Recent Labs  Lab 03/14/20 0437 03/15/20 0442 03/16/20 0822 03/19/20 1644 03/20/20 0035  NA 142 140 134* 134* 139  K 3.3* 4.3 3.9 4.7 4.4  CL 104 101 99 94* 102  CO2 26 29 25 24 25   GLUCOSE 112* 107* 130* 117* 124*  BUN 28* 25* 24* 47* 42*  CREATININE 1.28* 1.32* 1.51* 1.82* 1.86*  CALCIUM 8.7* 8.7* 8.7* 8.8* 8.6*  MG  --   --   --  1.7  --    GFR: Estimated Creatinine Clearance: 15.6 mL/min (A) (by C-G formula based on SCr of 1.86 mg/dL (H)). Liver Function Tests: Recent Labs  Lab 03/19/20 1644  AST 22  ALT 8  ALKPHOS 56  BILITOT 1.2  PROT 6.7  ALBUMIN 3.1*   No results for input(s): LIPASE, AMYLASE in the last 168 hours. No results for input(s): AMMONIA in the last 168 hours. Coagulation Profile: No results for input(s): INR, PROTIME in the last 168 hours. Cardiac Enzymes: No results for input(s): CKTOTAL, CKMB, CKMBINDEX, TROPONINI in the last 168 hours. BNP (last 3 results) No results for input(s): PROBNP in the last 8760 hours. HbA1C: No results for input(s): HGBA1C in the last 72 hours. CBG: No results for input(s): GLUCAP in the last 168 hours. Lipid Profile: No results for input(s): CHOL, HDL, LDLCALC, TRIG, CHOLHDL, LDLDIRECT in the last 72 hours. Thyroid Function Tests: No results for input(s): TSH, T4TOTAL, FREET4, T3FREE, THYROIDAB in the last 72 hours. Anemia Panel: No results for input(s): VITAMINB12, FOLATE, FERRITIN, TIBC, IRON, RETICCTPCT in the last 72 hours. Sepsis Labs: No results for input(s): PROCALCITON, LATICACIDVEN in the last 168 hours.  Recent Results (from the past  240 hour(s))  Culture, blood (routine x 2)     Status: None   Collection Time: 03/12/20  5:50 PM   Specimen: BLOOD  Result Value Ref Range Status   Specimen Description   Final    BLOOD RIGHT ANTECUBITAL Performed at [facility], [address], [place], [place]  Special Requests   Final    BOTTLES DRAWN AEROBIC AND ANAEROBIC Blood Culture adequate volume Performed at [facility], [address] [place], [place] [address]    Culture   Final    NO GROWTH 5 DAYS Performed at [facility], [address], [place], [place] [address]    Report Status 03/17/2020 FINAL  Final  SARS Coronavirus 2 by RT PCR (hospital order, performed in [facility] hospital lab) Nasopharyngeal Peripheral     Status: None   Collection Time: 03/12/20  5:52 PM   Specimen: Peripheral; Nasopharyngeal  Result Value Ref Range Status   SARS Coronavirus 2 NEGATIVE NEGATIVE Final    Comment: (NOTE) SARS-CoV-2 target nucleic acids are NOT DETECTED.  The SARS-CoV-2 RNA is generally detectable in upper and lower respiratory specimens during the acute phase of infection. The lowest concentration of SARS-CoV-2 viral copies this assay can detect is 250 copies / mL. A negative result does not preclude SARS-CoV-2 infection and should not be used as the sole basis for treatment or other patient management decisions.  A negative result may occur with improper specimen collection / handling, submission of specimen other than nasopharyngeal swab, presence of viral mutation(s) within the areas targeted by this assay, and inadequate number of viral copies (<250 copies / mL). A negative result must be combined with clinical observations, patient history, and epidemiological information.  Fact Sheet for Patients:   BoilerBrush.com.cy  Fact Sheet for Healthcare Providers: https://pope.com/  This test is not yet approved or  cleared by  the [place] FDA and has been authorized for detection and/or diagnosis of SARS-CoV-2 by FDA under an Emergency Use Authorization (EUA).  This EUA will remain in effect (meaning this test can be used) for the duration of the COVID-19 declaration under Section 564(b)(1) of the Act, 21 U.S.C. section 360bbb-3(b)(1), unless the authorization is terminated or revoked sooner.  Performed at [facility], [address] [place], [place] [address]   Culture, blood (routine x 2)     Status: None   Collection Time: 03/12/20  6:07 PM   Specimen: BLOOD  Result Value Ref Range Status   Specimen Description   Final    BLOOD LEFT ANTECUBITAL Performed at [facility], [address] [place], [place] [address]    Special Requests   Final    BOTTLES DRAWN AEROBIC AND ANAEROBIC Blood Culture adequate volume Performed at [facility], [address] [place], [place] [address]    Culture   Final    NO GROWTH 5 DAYS Performed at [facility], [address], [place], [place] [address]    Report Status 03/17/2020 FINAL  Final  Urine culture     Status: Abnormal   Collection Time: 03/12/20 11:54 PM   Specimen: Urine, Random  Result Value Ref Range Status   Specimen Description   Final    URINE, RANDOM Performed at [facility], [address] [place], [place] [address]    Special Requests   Final    NONE Performed at [facility], [address] [place], [place] [address]    Culture MULTIPLE SPECIES PRESENT, SUGGEST RECOLLECTION (A)  Final   Report Status 03/13/2020 FINAL  Final  SARS Coronavirus 2 by RT PCR (hospital order, performed in [facility] hospital lab) Nasopharyngeal Nasopharyngeal Swab     Status: None   Collection Time: 03/19/20  7:52 PM   Specimen: Nasopharyngeal Swab  Result Value Ref Range Status   SARS Coronavirus 2 NEGATIVE NEGATIVE Final    Comment: (NOTE) SARS-CoV-2 target nucleic acids are NOT  DETECTED.  The SARS-CoV-2 RNA is generally detectable in upper and lower respiratory specimens during the acute phase of infection. The lowest concentration of SARS-CoV-2 viral copies this assay can detect is 250 copies / mL. A negative result does not preclude SARS-CoV-2 infection and should not be used as the sole basis for treatment or other patient management decisions.  A negative result may occur with improper specimen collection / handling, submission of specimen other than nasopharyngeal swab, presence of viral mutation(s) within the areas targeted by this assay, and inadequate number of viral copies (<250 copies / mL). A negative result must be combined with clinical observations, patient history, and epidemiological information.  Fact Sheet for Patients:   BoilerBrush.com.cyhttps://www.fda.gov/media/136312/download  Fact Sheet for Healthcare Providers: https://pope.com/https://www.fda.gov/media/136313/download  This test is not yet approved or  cleared by the [place] FDA and has been authorized for detection and/or diagnosis of SARS-CoV-2 by FDA under an Emergency Use Authorization (EUA).  This EUA will remain in effect (meaning this test can be used) for the duration of the COVID-19 declaration under Section 564(b)(1) of the Act, 21 U.S.C. section 360bbb-3(b)(1), unless the authorization is terminated or revoked sooner.  Performed at [facility], [address] [place], [place] [id number]      Radiology Studies: CT Head Wo Contrast  Result Date: 03/19/2020 CLINICAL DATA:  86 year old female recently hospitalized with CHF. Seizure last night. EXAM: CT HEAD WITHOUT CONTRAST TECHNIQUE: Contiguous axial images were obtained from the base of the skull through the vertex without intravenous contrast. COMPARISON:  None. FINDINGS: Brain: No midline shift, mass effect, or evidence of intracranial mass lesion. No acute intracranial hemorrhage identified. No ventriculomegaly. Patchy and confluent  widespread cerebral white matter hypodensity, including deep white matter capsule involvement which appears greater in the left hemisphere. Comparatively mild heterogeneity in the bilateral deep gray nuclei. Small chronic appearing infarct of the right superior cerebellum. Occasional small areas of cortical encephalomalacia, including in the left middle frontal gyrus on coronal image 23. No cortically based acute infarct identified. Vascular: Calcified atherosclerosis at the skull base. Skull: No acute osseous abnormality identified. Sinuses/Orbits: Mild bubbly opacity in the right sphenoid. Other Visualized paranasal sinuses and mastoids are clear. Other: No acute orbit or scalp soft tissue finding. IMPRESSION: Advanced chronic ischemic disease with no acute intracranial abnormality by CT. Electronically Signed   By: Odessa FlemingH  Hall M.D.   On: 03/19/2020 17:00   MR BRAIN WO CONTRAST  Result Date: 03/20/2020 CLINICAL DATA:  Nontraumatic seizure. Recent hypertensive episode and admission for CHF. EXAM: MRI HEAD WITHOUT CONTRAST TECHNIQUE: Multiplanar, multiecho pulse sequences of the brain and surrounding structures were obtained without intravenous contrast. COMPARISON:  Head CT from yesterday FINDINGS: Brain: Punctate acute to subacute infarcts along the bilateral cerebral convexity and right caudate body, roughly along the cerebral watershed territories. A small more linear subacute appearing infarct is seen along the high left frontal cortex. Confluent chronic small vessel ischemic gliosis in the cerebral white matter. Small remote bilateral cerebellar infarcts. Remote cerebral hemispheric infarcts. Age normal brain volume. No acute hemorrhage, hydrocephalus, or masslike finding. There have been a few remote microhemorrhages with nonspecific pattern given the limited degree. Vascular: Normal flow voids Skull and upper cervical spine: Normal marrow signal Sinuses/Orbits: Bilateral cataract resection. Small  proteinaceous nasopharyngeal cyst  on the right. IMPRESSION: 1. Small acute to subacute infarcts along the bilateral cerebral cortex, likely embolic. 2. Background of advanced chronic small vessel disease. Electronically Signed   By: Marnee Spring M.D.   On: 03/20/2020 04:16   DG Chest Port 1 View  Result Date: 03/19/2020 CLINICAL DATA:  Low blood pressure. EXAM: PORTABLE CHEST 1 VIEW COMPARISON:  A 08/12/2019 FINDINGS: The heart size remains enlarged. The patient is status post prior median sternotomy. There are hazy bilateral airspace opacities with prominent interstitial lung markings. Aortic calcifications are again noted. There is no pneumothorax. There is no acute osseous abnormality. There may be small bilateral pleural effusions. IMPRESSION: Cardiomegaly with findings of congestive heart failure. Electronically Signed   By: Katherine Mantle M.D.   On: 03/19/2020 17:00   EEG adult  Result Date: 03/20/2020 Charlsie Quest, MD     03/20/2020 11:02 AM Patient Name: Morgan Holland MRN: [id number] Epilepsy Attending: Charlsie Quest Referring Physician/Provider: Dr Odie Sera Date: 03/20/2020 Duration: 23.17 mins Patient history: 86 year old female with seizure-like activity.  EEG to evaluate for seizures. Level of alertness: Awake, asleep AEDs during EEG study: None Technical aspects: This EEG study was done with scalp electrodes positioned according to the 10-20 International system of electrode placement. Electrical activity was acquired at a sampling rate of 500Hz  and reviewed with a high frequency filter of 70Hz  and a low frequency filter of 1Hz . EEG data were recorded continuously and digitally stored. Description: The posterior dominant rhythm consists of 8-9 Hz activity of moderate voltage (25-35 uV) seen predominantly in posterior head regions, symmetric and reactive to eye opening and eye closing. Sleep was characterized by vertex waves, sleep spindles (12 to 14 Hz), maximal frontocentral  region.    Hyperventilation and photic stimulation were not performed.   IMPRESSION: This study is within normal limits. No seizures or epileptiform discharges were seen throughout the recording.     LOS: 1 day   , MD [facility]  03/20/2020, 11:17 AM

## 2020-03-20 NOTE — Plan of Care (Signed)
  Problem: Education: Goal: Knowledge of General Education information will improve Description Including pain rating scale, medication(s)/side effects and non-pharmacologic comfort measures Outcome: Progressing   Problem: Health Behavior/Discharge Planning: Goal: Ability to manage health-related needs will improve Outcome: Progressing   

## 2020-03-21 ENCOUNTER — Encounter (HOSPITAL_COMMUNITY): Payer: Self-pay | Admitting: Certified Registered Nurse Anesthetist

## 2020-03-21 ENCOUNTER — Inpatient Hospital Stay (HOSPITAL_COMMUNITY): Payer: Medicare Other

## 2020-03-21 DIAGNOSIS — R933 Abnormal findings on diagnostic imaging of other parts of digestive tract: Secondary | ICD-10-CM

## 2020-03-21 DIAGNOSIS — I6330 Cerebral infarction due to thrombosis of unspecified cerebral artery: Secondary | ICD-10-CM

## 2020-03-21 DIAGNOSIS — Z515 Encounter for palliative care: Secondary | ICD-10-CM

## 2020-03-21 DIAGNOSIS — Z7189 Other specified counseling: Secondary | ICD-10-CM

## 2020-03-21 LAB — URINALYSIS, ROUTINE W REFLEX MICROSCOPIC
Bilirubin Urine: NEGATIVE
Glucose, UA: NEGATIVE mg/dL
Hgb urine dipstick: NEGATIVE
Ketones, ur: NEGATIVE mg/dL
Nitrite: NEGATIVE
Protein, ur: 100 mg/dL — AB
Specific Gravity, Urine: 1.023 (ref 1.005–1.030)
pH: 5 (ref 5.0–8.0)

## 2020-03-21 LAB — GLUCOSE, CAPILLARY: Glucose-Capillary: 116 mg/dL — ABNORMAL HIGH (ref 70–99)

## 2020-03-21 LAB — CBC
HCT: 28.4 % — ABNORMAL LOW (ref 36.0–46.0)
Hemoglobin: 8.8 g/dL — ABNORMAL LOW (ref 12.0–15.0)
MCH: 30 pg (ref 26.0–34.0)
MCHC: 31 g/dL (ref 30.0–36.0)
MCV: 96.9 fL (ref 80.0–100.0)
Platelets: 250 10*3/uL (ref 150–400)
RBC: 2.93 MIL/uL — ABNORMAL LOW (ref 3.87–5.11)
RDW: 18.2 % — ABNORMAL HIGH (ref 11.5–15.5)
WBC: 9.7 10*3/uL (ref 4.0–10.5)
nRBC: 0.3 % — ABNORMAL HIGH (ref 0.0–0.2)

## 2020-03-21 LAB — BASIC METABOLIC PANEL
Anion gap: 14 (ref 5–15)
BUN: 59 mg/dL — ABNORMAL HIGH (ref 8–23)
CO2: 23 mmol/L (ref 22–32)
Calcium: 8.6 mg/dL — ABNORMAL LOW (ref 8.9–10.3)
Chloride: 99 mmol/L (ref 98–111)
Creatinine, Ser: 2.38 mg/dL — ABNORMAL HIGH (ref 0.44–1.00)
GFR calc Af Amer: 20 mL/min — ABNORMAL LOW (ref >60)
GFR calc non Af Amer: 17 mL/min — ABNORMAL LOW (ref >60)
Glucose, Bld: 130 mg/dL — ABNORMAL HIGH (ref 70–99)
Potassium: 5.2 mmol/L — ABNORMAL HIGH (ref 3.5–5.1)
Sodium: 136 mmol/L (ref 135–145)

## 2020-03-21 NOTE — Progress Notes (Addendum)
STROKE TEAM PROGRESS NOTE    Interval History   No acute events overnight, patient is resting in bed and is curious to learn about her stroke work up. She states that she feels better today in comparison to yesterday.   Pertinent Lab Work and Imaging    03/13/20 Echocardiogram Complete  1. Left ventricular ejection fraction, by estimation, is 40 to 45%. The left ventricle has mildly decreased function. The left ventricle demonstrates global hypokinesis. There is mild concentric left ventricular  hypertrophy. Left ventricular diastolic function could not be evaluated.  2. Right ventricular systolic function is mildly reduced. The right ventricular size is mildly enlarged. There is normal pulmonary artery systolic pressure.  3. Left atrial size was severely dilated.  4. Right atrial size was moderately dilated.  5. There is severe thickening and calcification of the subvalvular mitral apparatus.There is severe annular calcification. The mitral valve has both degenerative and rheumatic features. Moderate mitral valve regurgitation. Mild to moderate mitral stenosis. The mean mitral valve gradient is 6.0 mmHg with average heart rate of 88 bpm.  6. The aortic valve is tricuspid. Aortic valve regurgitation is mild to moderate. Severe aortic valve stenosis. Aortic valve Vmax measures 4.38 m/s.   03/19/20 CT Head WO IV Contrast Advanced chronic ischemic disease with no acute intracranial abnormality by CT.  03/20/20 MR Angio Neck WO Contrast  The bilateral common and internal carotid arteries are patent within the neck without evidence of hemodynamically significant stenosis. Atherosclerotic plaque is present within both carotid bifurcations and proximal internal carotid arteries. Non-contrast technique and motion artifact precludes adequate evaluation of the origins of the vertebral arteries. Within this limitation, the cervical vertebral arteries are patent within the neck without appreciable  significant stenosis. Aberrant right subclavian artery.  03/20/20 MR Angio Head WO Contrast  The intracranial internal carotid arteries are patent. The M1 middle cerebral arteries are patent without significant stenosis. No M2 proximal branch occlusion or high-grade proximal stenosis is identified. The anterior cerebral arteries are patent.  The visualized intracranial vertebral arteries are patent. The intracranial left vertebral artery is dominant. The basilar artery is patent. The posterior cerebral arteries are patent proximally without significant stenosis. Posterior communicating arteries are hypoplastic or absent bilaterally.  No intracranial aneurysm is identified.  03/20/20 MRI Brain WO IV Contrast 1. Small acute to subacute infarcts along the bilateral cerebral cortex, likely embolic. 2. Background of advanced chronic small vessel disease.    Physical Examination   Constitutional: Calm, appropriate for condition, HOH  Cardiovascular: Normal RR Respiratory: No increased WOB   Neurological:  Mental status: Oriented to self, month, location and situation. Not oriented to year states it is 2012 but when told it is 2021 states that she was aware it is 2021 but has struggled to remember the correct year  Speech: Fluent with repetition and naming intact, no dysarthria  Cranial nerves: EOMI, VFF, Face is symmetric, Tongue midline, shoulder shrug intact  Motor: Normal bulk and tone. No drift.    Dlt Bic Tri FgS Grp HF  KnF KnE PIF DoF  R 5 5 5 5 5  +4 5 5 5 5   L 5 5 5 5 5  +4 5 5 5 5    Sensory: Intact to light touch throughout  Coordination: Not ataxia noted with FNF + HTS  Reflexes: Not assessed Gait: Deferred  NIHSS: 0   Assessment and Plan   Morgan Holland is a 86 y.o. female w/pmh of HTN,HLD, BPPV, hypothyroidism, GERD, chronic GI bleeding, iron  deficiency anemia, atrial valve replacement, myocardial infarct, severe aortic stenosis status post AVR now with severe aortic  valve stenosis, recently diagnosed with congestive heart failure during her last admission at [facility] (03/12/20-03/17/20) who presented with hypotension w/SBP drop to the 80's and seizure like activity. She was preparing to eat lunch on 03/18/20 when she began to have shaking motions that lasted for 15-20 s and was not responding during this time. Afterwards she was somnolent. Due to this activity, an MRI of the Brain was obtained that showed small acute to subacute infarcts along the bilateral cerebral cortex. Of note, she was outside the time window for IVTPA and was not a candidate for mechanical thrombectomy given low NIHSS.  #Small Punctate Stroke Along the Bilateral Cerebral Convexity and Right Caudate Body  Patient presented with the symptoms described above. Her stroke work up is essentially complete. Her MR Angio Head and Neck did not reveal significant stenosis, did show atherosclerotic plaque present within both carotid bifurcations. An echocardiogram was not repeated given she recently had one done on 03/13/20 that revealed an EF of 40 to 45 %, global hypokinesis of the left ventricle LVH, reduced right ventricular systolic function, severely dilated left atrium, moderately dilated right atrium and severe thickening and severe aortic valve stenosis. Stroke labs were completed including Lipid panel w/LDL 75 and Hemoglobin A1C 4.8. In regards to her stroke etiology given they are in watershed territories, meaning they could be in the setting of hypotension. Another etiology to consider is cardio embolic given her HF. Her case is medically complex with both Cardiology, Gastroenterology and Nephrology also following her. Per Cardiology she will continue to be symptomatic if they are unable to relieve the obstruction of her aortic valve prosthesis. Palliative care has been consulted. From a stroke standpoint it is being considered to be more aggressive with her stroke work up by obtaining a TEE to rule out an  intracardiac source + loop monitor to evaluate for atrial fibrillation. Will hold off on this for now pending discussion with palliative care. Given that she has had a small stroke w/a low NIHSS she qualifies for DAPT. Will hold off on DAPT for now pending GI work up. In the interim will continue Aspirin and Atorvastatin for secondary stroke prevention.  -Continue Aspirin 81 mg for secondary stroke prevention -Continue Atorvastatin 20 mg for secondary stroke prevention  -Consider TEE + loop monitor pending palliative care consult/discussion  -At discharge please place ambulatory referral to neurology for stroke follow up   #Newly diagnosed HF  #Hypertension #Hypoetnsion  She has a history of HTN for which she was previously taking Hydralazine and Lisinopril. During her last admission 03/12/20-03/17/20 these medications were stopped giving her soft blood pressures. She was continued on Furosemide and Metoprolol for her HF at discharge. Currently her SBP is trending in the upper 80s-90 range. Given her hypotension and the possibility that her strokes were in the setting of hypotension/hypoperfusion would be extremely careful with resuming her HF medications. Avoid any acute drops in blood pressure.  - Caution with resuming HF medications   #Seizure like Activity  She presented with seizure like activity noted by family. A routine EEG was obtained that did not show an epileptiform discharges. It is possible that her strokes, which are cortical caused her to seize. Will defer on AEDs at this time given first time seizure.  - Monitor for seizure like activity, if she has more activity can consider an AED regimen   #Hyperlipidemia From a  stroke prevention stand point, the LDL goal is < 70. Her LDL is 75. She was placed on Atorvastatin 20 mg this admission and is to continue this medication at discharge. Previously she was taking Atorvastatin 40 mg.  - Continue Atorvastatin 20 mg now and at  discharge  #Iron Deficiency Anemia  She has a history of iron deficiency anemia and her hemoglobin is trending in the 8 range at the moment. She has seen gastroenterology on an outpatient basis for diverticulitis. Her last EGD and colonoscopy were in 2018. At that time she was found to have a hiatal hernia,erythema and congestion in the antrum, internal hemorrhoids, diverticulosis and a polyp of 4 mm. No further colonoscopies were recommended after this per Dr. Georgiann Mohs, with GI. During her prior admission, her iron, folate and B12 were assessed, with all found to be low. She was given 1 unit of PRBC and  Folate + B12 supplements were initiated to help with anemia. Gastroenterology is currently following and are recommending a BA Esophagram + possible EGD. Pending GI work up, will initiate Plavix for DAPT.  - Consider initiating Plavix for DAPT when GI work up is complete/ Plavix is ok from a GI standpoint   The stroke team will continue to follow.   Hospital day # 2  Stark Jock, NP  [facility] Nurse Practitioner Patient seen and discussed with attending physician Dr. Roda Shutters   ATTENDING NOTE: I reviewed above note and agree with the assessment and plan. Pt was seen and examined.   86 year old female with history of aortic stenosis, aortic valve replacement, MI, hypertension, Covid in 06/2019, anemia admitted for hypotension, seizure-like activity and nausea vomiting.  MRI showed bilateral MCA/PCA, right MCA/ACA and right CR punctate infarcts.  MRA head and neck unremarkable.  EEG no seizure.  LDL 75 A1c 4.8.  Creatinine 1.86.  On exam, patient lying in bed, awake alert, hard of hearing, however orientated to age, people, and months, but not orientated to year.  Mild left nasolabial fold flattening, however visual field full, no gaze deviation, tongue midline, PERRL, EOMI.  Moving all extremities symmetrically and equally.  Sensation symmetrical, finger-to-nose grossly intact.  Gait  not tested.  In terms of the etiology of stroke, given the hypotension and mostly watershed area strokes, most likely the cause of her stroke is hypotension.  I talked with patient caregiver, granddaughter [name], she stated that in the past patient has hypertension needing 3 blood pressure medications, however since last discharge 5 days ago due to CHF, her blood pressure was low at home.  Current admission BP 80s to 90s, [name] said this is about the same at home.  Patient seizure activity could also due to hypoperfusion in the brain with hypotension.  Therefore, improved cardiac function, severe aortic stenosis and hypotension likely to way for further stroke prevention.  Cardiology is on board.   Another possibility for her stroke is cardiomyopathy and occult A. fib.  If family decided further aggressive care, loop recorder and possible TEE can be considered.  Given her anemia, will continue aspirin 81 at this time.  Once cleared from GI, may consider dual antiplatelet with aspirin and Plavix for 3 weeks and then either aspirin or Plavix alone.  Put her on Lipitor 20, recommend to continue on discharge. Pt seizure like activity likely related to hypoperfusion, EEG negative, no AED needed at this time. Will follow.  Marvel Plan, MD PhD Stroke Neurology 03/21/2020 5:25 PM   I spent  35 minutes in  total face-to-face time with the patient, more than 50% of which was spent in counseling and coordination of care, reviewing test results, images and medication, and discussing the diagnosis, treatment plan and potential prognosis. This patient's care requiresreview of multiple databases, neurological assessment, discussion with family, other specialists and medical decision making of high complexity. I had long discussion with granddaughter over the phone, updated pt current condition, treatment plan and potential prognosis, and answered all the questions.  She expressed understanding and appreciation.      To contact Stroke Continuity provider, please refer to [email or web address]. After hours, contact General Neurology

## 2020-03-21 NOTE — Evaluation (Signed)
Occupational Therapy Evaluation Patient Details Name: Morgan Holland MRN: [id number] DOB: 1926/12/01 Today's Date: 03/21/2020    History of Present Illness Pt is a 86 y.o. female with medical history significant for recent diagnosis of systolic CHF with EF 40 to 45%, severe aortic stenosis, chronic kidney disease, chronic anemia, hypothyroidism, and hypertension, who presented to the ED with low blood pressures and seizure-like episode. MRI brain  9/9: Small acute to subacute infarcts along the bilateral cerebral cortex, likely embolic. CXR 9/7 consistent with CHF.    Clinical Impression   Pt admitted with the above diagnoses and presents with below problem list. Pt will benefit from continued acute OT to address the below listed deficits and maximize independence with basic ADLs prior to d/c home. PTA pt was mod I with ADLs. Pt is currently min guard with LB ADLs and functional transfers/mobility. A bit nauseous at end of session. Pt reports vertigo at baseline.       Follow Up Recommendations  Home health OT;Supervision - Intermittent (OOB/mobility)    Equipment Recommendations  None recommended by OT    Recommendations for Other Services       Precautions / Restrictions Precautions Precautions: Fall Restrictions Weight Bearing Restrictions: No      Mobility Bed Mobility Overal bed mobility: Needs Assistance Bed Mobility: Supine to Sit     Supine to sit: Min guard;HOB elevated     General bed mobility comments: min guard for safety  Transfers Overall transfer level: Needs assistance Equipment used: Rolling walker (2 wheeled) Transfers: Sit to/from Stand Sit to Stand: Min guard         General transfer comment: from EOB to recliner. min g for safety    Balance Overall balance assessment: Needs assistance Sitting-balance support: Feet supported;Single extremity supported Sitting balance-Leahy Scale: Fair     Standing balance support: Bilateral upper extremity  supported Standing balance-Leahy Scale: Poor Standing balance comment: rw at baseline                           ADL either performed or assessed with clinical judgement   ADL Overall ADL's : Needs assistance/impaired Eating/Feeding: Set up;Sitting   Grooming: Min guard;Standing   Upper Body Bathing: Set up;Sitting   Lower Body Bathing: Min guard;Sit to/from stand   Upper Body Dressing : Set up;Sitting   Lower Body Dressing: Min guard;Sit to/from stand   Toilet Transfer: Min guard;Ambulation;RW   Toileting- Architect and Hygiene: Min guard;Sit to/from stand   Tub/ Shower Transfer: Min guard;Ambulation;Shower seat;Rolling walker   Functional mobility during ADLs: Min guard;Rolling walker General ADL Comments: Pt completed bed mobility, 1 grooming task standing at sink, and household functional mobility.      Vision         Perception     Praxis      Pertinent Vitals/Pain Pain Assessment: No/denies pain     Hand Dominance     Extremity/Trunk Assessment Upper Extremity Assessment Upper Extremity Assessment: Generalized weakness   Lower Extremity Assessment Lower Extremity Assessment: Defer to PT evaluation       Communication Communication Communication: HOH   Cognition Arousal/Alertness: Awake/alert Behavior During Therapy: WFL for tasks assessed/performed Overall Cognitive Status: History of cognitive impairments - at baseline                                 General Comments: pleasantly confused.  General Comments       Exercises     Shoulder Instructions      Home Living Family/patient expects to be discharged to:: Private residence Living Arrangements: Other relatives (grand daughter) Available Help at Discharge: Family;Available PRN/intermittently;Other (Comment) (has 2 grand daughters and a grandson all of who can help) Type of Home: House Home Access: Level entry     Home Layout: One level      Bathroom Shower/Tub: Chief Strategy Officer: Standard     Home Equipment: Shower seat;Grab bars - tub/shower;Hand held Careers information officer - 4 wheels;Cane - single point   Additional Comments: uses shower seat at baseline      Prior Functioning/Environment Level of Independence: Independent with assistive device(s)        Comments: rollator        OT Problem List: Decreased strength;Decreased activity tolerance;Impaired balance (sitting and/or standing);Decreased knowledge of use of DME or AE;Decreased knowledge of precautions      OT Treatment/Interventions: Self-care/ADL training;Therapeutic exercise;Energy conservation;DME and/or AE instruction;Therapeutic activities;Patient/family education;Balance training    OT Goals(Current goals can be found in the care plan section) Acute Rehab OT Goals Patient Stated Goal: feel better. home. OT Goal Formulation: With patient Time For Goal Achievement: 04/04/20 Potential to Achieve Goals: Good ADL Goals Pt Will Perform Lower Body Dressing: with modified independence;sit to/from stand Pt Will Transfer to Toilet: with modified independence;ambulating Pt Will Perform Toileting - Clothing Manipulation and hygiene: with modified independence;sit to/from stand Pt Will Perform Tub/Shower Transfer: with supervision;ambulating;shower seat;rolling walker  OT Frequency: Min 2X/week   Barriers to D/C:            Co-evaluation PT/OT/SLP Co-Evaluation/Treatment: Yes Reason for Co-Treatment: For patient/therapist safety   OT goals addressed during session: ADL's and self-care      AM-PAC OT "6 Clicks" Daily Activity     Outcome Measure Help from another person eating meals?: None Help from another person taking care of personal grooming?: None Help from another person toileting, which includes using toliet, bedpan, or urinal?: A Little Help from another person bathing (including washing, rinsing, drying)?: A Little Help  from another person to put on and taking off regular upper body clothing?: None Help from another person to put on and taking off regular lower body clothing?: A Little 6 Click Score: 21   End of Session Equipment Utilized During Treatment: Rolling walker;Gait belt  Activity Tolerance: Other (comment) (nauseous at end of session but improving) Patient left: in chair;with call bell/phone within reach;with chair alarm set;with family/visitor present  OT Visit Diagnosis: Unsteadiness on feet (R26.81);Muscle weakness (generalized) (M62.81)                Time: 1696-7893 OT Time Calculation (min): 17 min Charges:  OT General Charges $OT Visit: 1 Visit OT Evaluation $OT Eval Low Complexity: 1 Low  Raynald Kemp, OT Acute Rehabilitation Services Pager: [phone] Office: [phone]   Pilar Grammes 03/21/2020, 2:55 PM

## 2020-03-21 NOTE — Evaluation (Signed)
Physical Therapy Evaluation Patient Details Name: Morgan Holland MRN: [id number] DOB: Jun 10, 1927 Today's Date: 03/21/2020   History of Present Illness  Pt is a 86 y.o. female with medical history significant for recent diagnosis of systolic CHF with EF 40 to 45%, severe aortic stenosis, chronic kidney disease, chronic anemia, hypothyroidism, and hypertension, who presented to the ED with low blood pressures and seizure-like episode. MRI brain  9/9: Small acute to subacute infarcts along the bilateral cerebral cortex, likely embolic. CXR 9/7 consistent with CHF.   Clinical Impression   Patient received in bed, very pleasantly confused and cooperative with therapy. See below for mobility/assist levels. Generally able to mobilize at a min guard level with RW well, slightly self limiting as she reports "I don't want to overdo things this first day up", but mobilized fairly safely with device. Left up in recliner with all needs met, chair alarm active. Feel she will benefit from skilled HHPT services once medically ready for DC.     Follow Up Recommendations Home health PT;Supervision for mobility/OOB    Equipment Recommendations  Rolling walker with 5" wheels    Recommendations for Other Services       Precautions / Restrictions Precautions Precautions: Fall Restrictions Weight Bearing Restrictions: No      Mobility  Bed Mobility Overal bed mobility: Needs Assistance Bed Mobility: Supine to Sit     Supine to sit: Min guard;HOB elevated     General bed mobility comments: min guard for safety, increased time and effort  Transfers Overall transfer level: Needs assistance Equipment used: Rolling walker (2 wheeled) Transfers: Sit to/from Stand Sit to Stand: Min guard         General transfer comment: min guard for safety and occasional cues for hand placement, but mobilized well  Ambulation/Gait Ambulation/Gait assistance: Min guard Gait Distance (Feet): 60 Feet Assistive  device: Rolling walker (2 wheeled) Gait Pattern/deviations: Step-through pattern;Decreased step length - right;Decreased step length - left;Trunk flexed;Narrow base of support Gait velocity: decreased   General Gait Details: min guard for safety, able to maintain balance and safety well with use of device  Stairs            Wheelchair Mobility    Modified Rankin (Stroke Patients Only)       Balance Overall balance assessment: Needs assistance Sitting-balance support: Feet supported;Single extremity supported Sitting balance-Leahy Scale: Fair     Standing balance support: Bilateral upper extremity supported Standing balance-Leahy Scale: Poor Standing balance comment: rw at baseline                             Pertinent Vitals/Pain Pain Assessment: No/denies pain    Home Living Family/patient expects to be discharged to:: Private residence Living Arrangements: Other relatives (grand daughter) Available Help at Discharge: Family;Available PRN/intermittently;Other (Comment) (has 2 grand daughters and a grandson all of who can help) Type of Home: House Home Access: Level entry     Home Layout: One level Home Equipment: Shower seat;Grab bars - tub/shower;Hand held shower head;Walker - 4 wheels;Cane - single point Additional Comments: uses shower seat at baseline    Prior Function Level of Independence: Independent with assistive device(s)         Comments: rollator     Hand Dominance        Extremity/Trunk Assessment   Upper Extremity Assessment Upper Extremity Assessment: Defer to OT evaluation    Lower Extremity Assessment Lower Extremity Assessment: Generalized weakness  Cervical / Trunk Assessment Cervical / Trunk Assessment: Kyphotic  Communication   Communication: HOH  Cognition Arousal/Alertness: Awake/alert Behavior During Therapy: WFL for tasks assessed/performed Overall Cognitive Status: History of cognitive impairments - at  baseline                                 General Comments: pleasantly confused.       General Comments      Exercises     Assessment/Plan    PT Assessment Patient needs continued PT services  PT Problem List Decreased strength;Decreased activity tolerance;Decreased safety awareness;Decreased balance;Decreased mobility;Decreased coordination       PT Treatment Interventions DME instruction;Balance training;Gait training;Stair training;Functional mobility training;Patient/family education;Therapeutic exercise;Therapeutic activities    PT Goals (Current goals can be found in the Care Plan section)  Acute Rehab PT Goals Patient Stated Goal: feel better. home. PT Goal Formulation: With patient Time For Goal Achievement: 04/04/20 Potential to Achieve Goals: Good    Frequency Min 3X/week   Barriers to discharge        Co-evaluation   Reason for Co-Treatment: For patient/therapist safety   OT goals addressed during session: ADL's and self-care       AM-PAC PT "6 Clicks" Mobility  Outcome Measure Help needed turning from your back to your side while in a flat bed without using bedrails?: None Help needed moving from lying on your back to sitting on the side of a flat bed without using bedrails?: A Little Help needed moving to and from a bed to a chair (including a wheelchair)?: A Little Help needed standing up from a chair using your arms (e.g., wheelchair or bedside chair)?: A Little Help needed to walk in hospital room?: A Little Help needed climbing 3-5 steps with a railing? : A Little 6 Click Score: 19    End of Session Equipment Utilized During Treatment: Gait belt Activity Tolerance: Patient tolerated treatment well Patient left: in chair;with call bell/phone within reach;with chair alarm set Nurse Communication: Mobility status PT Visit Diagnosis: Unsteadiness on feet (R26.81);Muscle weakness (generalized) (M62.81)    Time: 7505-1833 PT Time  Calculation (min) (ACUTE ONLY): 17 min   Charges:   PT Evaluation $PT Eval Moderate Complexity: 1 Mod          Windell Norfolk, DPT, PN1   Supplemental Physical Therapist [place]    Pager [phone] Acute Rehab Office [phone]

## 2020-03-21 NOTE — Progress Notes (Signed)
Progress Note  Patient Name: Morgan Holland Date of Encounter: 03/21/2020  Primary Cardiologist: Chilton Si, MD   Subjective   I spoke to the patient's granddaughter Morgan Holland for greater than 15 minutes today discussing Morgan Holland's care.  Patient is overall feeling better than yesterday.  She hopes to continue aggressive approach to her medical issues.  Inpatient Medications    Scheduled Meds:  aspirin EC  81 mg Oral Daily   atorvastatin  20 mg Oral Daily   levothyroxine  100 mcg Oral Q0600   pantoprazole  40 mg Oral Daily   sodium chloride flush  3 mL Intravenous Q12H   Continuous Infusions:  sodium chloride     PRN Meds: sodium chloride, acetaminophen, ondansetron (ZOFRAN) IV, sodium chloride flush   Vital Signs    Vitals:   03/21/20 0413 03/21/20 0920 03/21/20 1940 03/21/20 2208  BP: 90/65 92/71 (!) 83/59 (!) 93/52  Pulse: 84 89 90 87  Resp:  20 20   Temp:  98.5 F (36.9 C) (!) 97.5 F (36.4 C)   TempSrc:  Oral Oral   SpO2:  99% 100%   Weight:      Height:        Intake/Output Summary (Last 24 hours) at 03/21/2020 2216 Last data filed at 03/21/2020 2100 Gross per 24 hour  Intake 381.67 ml  Output --  Net 381.67 ml   Filed Weights   03/19/20 1404 03/20/20 0526 03/21/20 0007  Weight: 56.1 kg 55.4 kg 55.5 kg    Telemetry    Sinus rhythm- Personally Reviewed  ECG     Physical Exam   GEN: No acute distress.   Neck: No JVD Cardiac: regular rhythm, normal rate, 3/6 SEM  Respiratory: Clear to auscultation bilaterally. GI: Soft, nontender, non-distended  MS: No edema; No deformity. Neuro:  Nonfocal  Psych: Normal affect   Labs    Chemistry Recent Labs  Lab 03/19/20 1644 03/20/20 0035 03/21/20 0658  NA 134* 139 136  K 4.7 4.4 5.2*  CL 94* 102 99  CO2 24 25 23   GLUCOSE 117* 124* 130*  BUN 47* 42* 59*  CREATININE 1.82* 1.86* 2.38*  CALCIUM 8.8* 8.6* 8.6*  PROT 6.7  --   --   ALBUMIN 3.1*  --   --   AST 22  --   --   ALT 8  --    --   ALKPHOS 56  --   --   BILITOT 1.2  --   --   GFRNONAA 24* 23* 17*  GFRAA 28* 27* 20*  ANIONGAP 16* 12 14     Hematology Recent Labs  Lab 03/19/20 1644 03/20/20 0035 03/21/20 0658  WBC 8.8 9.5 9.7  RBC 3.18* 2.88* 2.93*  HGB 9.4* 8.3* 8.8*  HCT 30.4* 27.6* 28.4*  MCV 95.6 95.8 96.9  MCH 29.6 28.8 30.0  MCHC 30.9 30.1 31.0  RDW 17.2* 17.2* 18.2*  PLT 255 244 250    Cardiac EnzymesNo results for input(s): TROPONINI in the last 168 hours. No results for input(s): TROPIPOC in the last 168 hours.   BNP Recent Labs  Lab 03/19/20 1643  BNP 4,371.6*     DDimer No results for input(s): DDIMER in the last 168 hours.   Radiology    MR ANGIO HEAD WO CONTRAST  Result Date: 03/20/2020 CLINICAL DATA:  Neuro deficit, acute, stroke suspected. EXAM: MRA HEAD WITHOUT CONTRAST TECHNIQUE: Angiographic images of the Circle of Willis were obtained using MRA technique without intravenous contrast.  COMPARISON:  Brain MRI 03/20/2020, head CT 03/19/2020 FINDINGS: The intracranial internal carotid arteries are patent. The M1 middle cerebral arteries are patent without significant stenosis. No M2 proximal branch occlusion or high-grade proximal stenosis is identified. The anterior cerebral arteries are patent. The visualized intracranial vertebral arteries are patent. The intracranial left vertebral artery is dominant. The basilar artery is patent. The posterior cerebral arteries are patent proximally without significant stenosis. Posterior communicating arteries are hypoplastic or absent bilaterally. No intracranial aneurysm is identified. IMPRESSION: No intracranial large vessel occlusion or proximal high-grade stenosis. Electronically Signed   By: Jackey Loge DO   On: 03/20/2020 15:40   MR ANGIO NECK WO CONTRAST  Result Date: 03/20/2020 CLINICAL DATA:  Neuro deficit, acute, stroke suspected. EXAM: MRA NECK WITHOUT CONTRAST TECHNIQUE: Angiographic images of the neck were obtained using MRA  technique without intravenous contrast. Carotid stenosis measurements (when applicable) are obtained utilizing NASCET criteria, using the distal internal carotid diameter as the denominator. COMPARISON:  Noncontrast brain MRI 03/20/2020, concurrently performed MRA head 03/20/2020, head CT 03/19/2020. FINDINGS: Aberrant right subclavian artery. Motion degradation and non-contrast technique precludes adequate evaluation for stenosis within the proximal right subclavian artery. No hemodynamically significant stenosis of the proximal left subclavian artery. The bilateral common and internal carotid arteries are patent within the neck without evidence of hemodynamically significant stenosis (50% or greater). There is atherosclerotic plaque within both carotid bifurcations and proximal internal carotid arteries. Non-contrast technique and motion artifact precludes adequate evaluation of the origins of the vertebral arteries. Within this limitation, the vertebral arteries are patent within the neck bilaterally with antegrade flow and without appreciable significant stenosis. The left vertebral artery is dominant. IMPRESSION: The bilateral common and internal carotid arteries are patent within the neck without evidence of hemodynamically significant stenosis. Atherosclerotic plaque is present within both carotid bifurcations and proximal internal carotid arteries. Non-contrast technique and motion artifact precludes adequate evaluation of the origins of the vertebral arteries. Within this limitation, the cervical vertebral arteries are patent within the neck without appreciable significant stenosis. Aberrant right subclavian artery. Electronically Signed   By: Jackey Loge DO   On: 03/20/2020 15:51   MR BRAIN WO CONTRAST  Result Date: 03/20/2020 CLINICAL DATA:  Nontraumatic seizure. Recent hypertensive episode and admission for CHF. EXAM: MRI HEAD WITHOUT CONTRAST TECHNIQUE: Multiplanar, multiecho pulse sequences of the  brain and surrounding structures were obtained without intravenous contrast. COMPARISON:  Head CT from yesterday FINDINGS: Brain: Punctate acute to subacute infarcts along the bilateral cerebral convexity and right caudate body, roughly along the cerebral watershed territories. A small more linear subacute appearing infarct is seen along the high left frontal cortex. Confluent chronic small vessel ischemic gliosis in the cerebral white matter. Small remote bilateral cerebellar infarcts. Remote cerebral hemispheric infarcts. Age normal brain volume. No acute hemorrhage, hydrocephalus, or masslike finding. There have been a few remote microhemorrhages with nonspecific pattern given the limited degree. Vascular: Normal flow voids Skull and upper cervical spine: Normal marrow signal Sinuses/Orbits: Bilateral cataract resection. Small proteinaceous nasopharyngeal cyst on the right. IMPRESSION: 1. Small acute to subacute infarcts along the bilateral cerebral cortex, likely embolic. 2. Background of advanced chronic small vessel disease. Electronically Signed   By: Marnee Spring M.D.   On: 03/20/2020 04:16   US RENAL  Result Date: 03/21/2020 CLINICAL DATA:  Acute kidney injury EXAM: RENAL / URINARY TRACT ULTRASOUND COMPLETE COMPARISON:  MRI 07/08/2016 FINDINGS: Right Kidney: Renal measurements: 8.9 x 4.8 x 4.8 cm = volume: 105 mL. Echogenicity  within normal limits. 8 mm simple cyst at the midpole. No solid mass, shadowing stone, or hydronephrosis visualized. Left Kidney: Renal measurements: 10.1 x 5.0 x 4.0 cm = volume: 106 mL. Echogenicity within normal limits. 4 mm echogenic stone within the midpole of the left kidney. 2.1 cm simple cyst at the upper pole. No solid mass or hydronephrosis visualized. Bladder: Appears normal for degree of bladder distention. Other: None. IMPRESSION: 1. No evidence of obstructive uropathy. 2. Nonobstructing 4 mm left renal stone. 3. Simple bilateral renal cysts. Electronically Signed    By: Duanne GuessNicholas  Plundo D.O.   On: 03/21/2020 13:05   EEG adult  Result Date: 03/20/2020 Charlsie QuestYadav, Priyanka O, MD     03/20/2020 11:02 AM Patient Name: Norberto Sorensonlizbeth Amy MRN: [id number] Epilepsy Attending: Charlsie QuestPriyanka O Yadav Referring Physician/Provider: Dr Odie Seraimothy Opyd Date: 03/20/2020 Duration: 23.17 mins Patient history: 86 year old female with seizure-like activity.  EEG to evaluate for seizures. Level of alertness: Awake, asleep AEDs during EEG study: None Technical aspects: This EEG study was done with scalp electrodes positioned according to the 10-20 International system of electrode placement. Electrical activity was acquired at a sampling rate of 500Hz  and reviewed with a high frequency filter of 70Hz  and a low frequency filter of 1Hz . EEG data were recorded continuously and digitally stored. Description: The posterior dominant rhythm consists of 8-9 Hz activity of moderate voltage (25-35 uV) seen predominantly in posterior head regions, symmetric and reactive to eye opening and eye closing. Sleep was characterized by vertex waves, sleep spindles (12 to 14 Hz), maximal frontocentral region.    Hyperventilation and photic stimulation were not performed.   IMPRESSION: This study is within normal limits. No seizures or epileptiform discharges were seen throughout the recording. Priyanka Annabelle Harman Yadav   DG ESOPHAGUS W SINGLE CM (SOL OR THIN BA)  Result Date: 03/21/2020 CLINICAL DATA:  Dysphagia, globus sensation EXAM: ESOPHOGRAM/BARIUM SWALLOW TECHNIQUE: Single contrast examination was performed using thin barium or water soluble. FLUOROSCOPY TIME:  Fluoroscopy Time:  36 seconds Radiation Exposure Index (if provided by the fluoroscopic device): 2.9 mGy COMPARISON:  None. FINDINGS: Patient swallowed barium without difficulty. There is narrowing of the distal esophagus at the gastroesophageal junction. This is likely responsible for holdup of contrast seen throughout the study. Motility is normal. Small hiatal hernia. No  definite gastroesophageal reflux. IMPRESSION: Narrowing of the distal esophagus at the gastroesophageal junction likely responsible for holdup of contrast seen throughout the study. Retained contrast in the esophagus corresponded to symptoms. Electronically Signed   By: Guadlupe SpanishPraneil  Patel M.D.   On: 03/21/2020 14:48    Cardiac Studies   Relevant CV Studies:   Echo 03/13/20: 1. Left ventricular ejection fraction, by estimation, is 40 to 45%. The  left ventricle has mildly decreased function. The left ventricle  demonstrates global hypokinesis. There is mild concentric left ventricular  hypertrophy. Left ventricular diastolic  function could not be evaluated.   2. Right ventricular systolic function is mildly reduced. The right  ventricular size is mildly enlarged. There is normal pulmonary artery  systolic pressure.   3. Left atrial size was severely dilated.   4. Right atrial size was moderately dilated.   5. There is severe thickening and calcification of the subvalvular mitral  apparatus. There is severe annular calcification. The mitral valve has  both degenerative and rheumatic features. Moderate mitral valve  regurgitation. Mild to moderate mitral  stenosis. The mean mitral valve gradient is 6.0 mmHg with average heart  rate of 88 bpm.   6.  The aortic valve is tricuspid. Aortic valve regurgitation is mild to  moderate. Severe aortic valve stenosis. Aortic valve Vmax measures 4.38  m/s.      Patient Profile     86 y.o. female with severe bioprosthetic aortic valve stenosis, SVT, hypertension, and hypothyroidism.  Assessment & Plan   Principal Problem:   Acute on chronic systolic CHF (congestive heart failure) (HCC) Active Problems:   Hypothyroidism   Pressure injury of skin   Severe aortic stenosis   Iron deficiency anemia   Acute renal failure superimposed on stage 3 chronic kidney disease (HCC)   Elevated troponin   Observed seizure-like activity (HCC)   Hypotension    Cerebral thrombosis with cerebral infarction   Goals of care, counseling/discussion   DNR (do not resuscitate) discussion   Palliative care by specialist   I discussed the patient's care in detail with her granddaughter Morgan Holland.  I have reviewed her care with the primary team.  At this point I would recommend involvement of gastroenterology.  Palliative care can be involved for symptom management.  I have explained to Morgan Holland that involving palliative care is a proactive step in symptom management.  Morgan Holland agrees that high quality of life is what she and her grandmother both hope for her.  The patient hopes for continued aggressive measures.  If that is the goal, resolution of acute kidney injury and better understanding of anemia are warranted to continue work-up for valve in valve TAVR.  Structural consult from last hospitalization suggest that she may be a candidate for valve in valve TAVR once her anemia is improved, and stability of hemoglobin would be optimal.       For questions or updates, please contact [facility] Please consult [email or web address] for contact info under        Signed, Parke Poisson, MD  03/21/2020

## 2020-03-21 NOTE — Consult Note (Addendum)
[facility] Consult: 8:36 AM 03/21/2020  LOS: 2 days    Referring Provider: Lanae Boast MD per request of cardiology service.   Primary Care Physician:  Ardyth Gal, MD Primary Gastroenterologist:  Dr. Rayna Sexton at [facility].       Reason for Consultation:  anemia   HPI: Morgan Holland is a 86 y.o. female. PMH Hearing loss.  Bioprosthetic AVR 2014.  SVT.  Nephrolithiasis.  Anemia.   Hypothyroidism.  HLD.  Brain atrophy per CT.  Robotic repair ventral umbilical hernia, LOA 12/2014.   Covid 19 in 06/2019.  GERD.  S/p cholecystectomy. Gallstone in cystic duct remnant, seen on CT 02/2017 .  Marland Kitchen    Acute on chronic sigmoid diverticulitis w sigmoid abscess 05/2016,  02/2017.  CT then: "acute on chronic distal sigmoid diverticulitis complicated by a poorly defined gas-containing 3.7 x 2.2 x 2.2 cm pericolonic abscess, which is adherent to the right fallopian tube" 04/2017 EGD:  Normal GE Jx, Z line at 40 cm. Normal esophagus. HH.  Antral erythema and congestion. Duodenum wnl.  04/2017 Colonoscopy: 4 mm cecal polyp. Internal hemmorhoids, Moderate pan-colonic diverticulosis.  Dr. Georgiann Mohs does not recommend future colonoscopy.   Iron def anemia, supplemented w po iron bid dates back several years.  A few notes mention anemia d/t chronic blood loss but no evidence to suport this found.     Baseline Hgb of 10.3 in July and Nov/Dec 2020.  MCVs 86 to 92 Platelets and WBCs ok.   Last TSH normal in 02/2017. Chronic nausea managed with tid zofran per notes of 01/2018.  Apparently worse since 06/2020 Covid pna.  Does not vomit or regurgitate.   Also has dysphagia at level of UES, feels all consistencies get stuck intermittently but chases w liquids and eventually stuff passes through.  + dry mouth.    Constipation well controlled, daily  black BM's, on daily Miralax.  No abd pain.    Admitted 8/31 - 03/16/20 with acute, new CHF, severe prosthetic valve AVR stenosis, moderated MV stenosis.  LVEF 40 to 45%.  Received 1 PRBC, dose Feraheme, Hgb 7.1 >> 9.7.     Iron 13, ferritin 20, folate 4.2.  B12 ok at 242.   Discharged on po iron bid, new folic acid, new B12.  Possible TAVR in future.     Readmitted yesterday w SBP in 80s, intermittent n/v, AMS, "shaking all over",  In ED had AKI, creat 1.87  >> 2.5 c/w previous 1.51.  trops elevated to 796, BNP 4372.  Head CT w small acute/subacute infarcts, likely embolic on top of advanced chronic small vsl dz.  EEG shows no epileptic activity.  Hgb is 8.8.     Cardiology sees barriers moving forward w TAVR, requested GI eval anemia as well as Pall care GOC eval.     Past Medical History:  Diagnosis Date  . Aortic stenosis, severe   . Hypertension   . MI (myocardial infarction) (HCC)   . S/P AVR   . Thyroid disease     Past Surgical History:  Procedure Laterality  Date  . AORTIC VALVE REPLACEMENT      Prior to Admission medications   Medication Sig Start Date End Date Taking? Authorizing Provider  acetaminophen (TYLENOL) 325 MG tablet Take 650 mg by mouth every 6 (six) hours as needed for headache (pain).   Yes [provider]  aspirin EC 81 MG tablet Take 81 mg by mouth daily.    Yes [provider]  ferrous sulfate 325 (65 FE) MG EC tablet Take 1 tablet by mouth 2 (two) times daily. 05/10/19  Yes [provider]  folic acid (FOLVITE) 1 MG tablet Take 1 tablet (1 mg total) by mouth daily. 03/16/20  Yes Calvert Cantor, MD  furosemide (LASIX) 40 MG tablet Take 1 tablet (40 mg total) by mouth daily. 03/16/20 03/16/21 Yes Calvert Cantor, MD  levothyroxine (SYNTHROID) 100 MCG tablet Take 100 mcg by mouth every morning. 03/04/20  Yes [provider]  meclizine (ANTIVERT) 12.5 MG tablet Take 1 tablet (12.5 mg total) by mouth 3 (three) times daily as needed  for dizziness. 06/16/19  Yes Dorcas Carrow, MD  metoprolol tartrate (LOPRESSOR) 25 MG tablet Take 0.5 tablets (12.5 mg total) by mouth 2 (two) times daily. 03/16/20  Yes Calvert Cantor, MD  Multiple Vitamin (MULTIVITAMIN) capsule Take 1 capsule by mouth daily.   Yes [provider]  omeprazole (PRILOSEC) 40 MG capsule Take 40 mg by mouth 2 (two) times daily.  04/06/19  Yes [provider]  ondansetron (ZOFRAN-ODT) 8 MG disintegrating tablet Take 8 mg by mouth every 12 (twelve) hours as needed for nausea. 11/23/19  Yes [provider]  polyethylene glycol (MIRALAX / GLYCOLAX) 17 g packet Take 17 g by mouth daily. 03/16/20  Yes Calvert Cantor, MD  potassium chloride (KLOR-CON) 10 MEQ tablet Take 2 tablets (20 mEq total) by mouth daily. 03/16/20  Yes Calvert Cantor, MD  triamcinolone cream (KENALOG) 0.1 % Apply 1 application topically See admin instructions. Apply topically to chin daily as needed for itching/rash 06/20/15  Yes [provider]  vitamin B-12 1000 MCG tablet Take 1 tablet (1,000 mcg total) by mouth daily. 03/16/20  Yes Calvert Cantor, MD    Scheduled Meds: . aspirin EC  81 mg Oral Daily  . atorvastatin  20 mg Oral Daily  . heparin  5,000 Units Subcutaneous Q8H  . levothyroxine  100 mcg Oral Q0600  . pantoprazole  40 mg Oral Daily  . sodium chloride flush  3 mL Intravenous Q12H   Infusions: . sodium chloride     PRN Meds: sodium chloride, acetaminophen, ondansetron (ZOFRAN) IV, sodium chloride flush   Allergies as of 03/19/2020  . (No Known Allergies)    Family History  Problem Relation Age of Onset  . Cancer Mother   . Heart attack Father     Social History   Socioeconomic History  . Marital status: Widowed    Spouse name: Not on file  . Number of children: Not on file  . Years of education: Not on file  . Highest education level: Not on file  Occupational History  . Not on file  Tobacco Use  . Smoking status: Never Smoker  . Smokeless  tobacco: Never Used  Substance and Sexual Activity  . Alcohol use: Not Currently  . Drug use: Never  . Sexual activity: Not on file  Other Topics Concern  . Not on file  Social History Narrative  . Not on file   Social Determinants of Health   Financial Resource Strain:   .  Difficulty of Paying Living Expenses: Not on file  Food Insecurity:   . Worried About Programme researcher, broadcasting/film/video in the Last Year: Not on file  . Ran Out of Food in the Last Year: Not on file  Transportation Needs:   . Lack of Transportation (Medical): Not on file  . Lack of Transportation (Non-Medical): Not on file  Physical Activity:   . Days of Exercise per Week: Not on file  . Minutes of Exercise per Session: Not on file  Stress:   . Feeling of Stress : Not on file  Social Connections:   . Frequency of Communication with Friends and Family: Not on file  . Frequency of Social Gatherings with Friends and Family: Not on file  . Attends Religious Services: Not on file  . Active Member of Clubs or Organizations: Not on file  . Attends Banker Meetings: Not on file  . Marital Status: Not on file  Intimate Partner Violence:   . Fear of Current or Ex-Partner: Not on file  . Emotionally Abused: Not on file  . Physically Abused: Not on file  . Sexually Abused: Not on file    REVIEW OF SYSTEMS: Constitutional:  Weakness is better today ENT:  No nose bleeds Pulm:  SOB is stable, overall improved.  No productive cough. CV:  No palpitations, no LE edema.  GU:  No hematuria, no frequency GI:  seeHPI Heme:  See HPI   Transfusions:  See HPI.   Neuro:  See HPI.  No headaches, no peripheral tingling or numbness Derm:  Sacral pressure ulcer.   Endocrine:  No sweats or chills.  No polyuria or dysuria Immunization:  Not queried.   Travel:  None beyond local counties in last few months.    PHYSICAL EXAM: Vital signs in last 24 hours: Vitals:   03/21/20 0335 03/21/20 0413  BP: (!) 88/52 90/65  Pulse:  85 84  Resp: 17   Temp: (!) 97.4 F (36.3 C)   SpO2: 97%    Wt Readings from Last 3 Encounters:  03/21/20 55.5 kg  03/16/20 56.1 kg  06/12/19 62.5 kg    General: delightful, pleasant.  Looks much better than expected, not ill looking.  Somewhat frail, appropriate for 89. Head: No facial asymmetry or swelling.  No signs of head trauma. Eyes: No scleral icterus.  No conjunctival pallor.  EOMI. Ears: Hard of hearing but she was able to hear me fine if I spoke slowly, clearly and a bit louder, close to her. Nose: No congestion or discharge Mouth: Slightly dry oral mucosa.  Upper full denture in place.  Tongue midline.  No lesions, sores, exudates. Neck: No JVD, masses, thyromegaly Lungs: Crackles at the bases more prominent on the left.  No labored breathing, no cough. Heart: RRR, rate in 80s.  Positive systolic murmur. Abdomen: Soft without tenderness.  No masses, HSM, bruits, hernias.  Active bowel sounds..   Rectal: Deferred Musc/Skeltl: No joint redness, swelling.  Spinal kyphosis.  Osteoporotic overall appearance.  Some arthritic changes in her fingers/hands. Extremities: No CCE. Neurologic: Appropriate.  Hard of hearing.  Moves all 4 limbs.  No tremors.  Oriented to place, self, time, situation. Skin: No rashes, no sores, no suspicious lesions. Nodes: No cervical adenopathy Psych: Calm, fluid speech.  Pleasant affect.  Intake/Output from previous day: 09/08 0701 - 09/09 0700 In: 353.3 [P.O.:100; I.V.:253.3] Out: -  Intake/Output this shift: No intake/output data recorded.  LAB RESULTS: Recent Labs    03/19/20  1644 03/20/20 0035 03/21/20 0658  WBC 8.8 9.5 9.7  HGB 9.4* 8.3* 8.8*  HCT 30.4* 27.6* 28.4*  PLT 255 244 250   BMET Lab Results  Component Value Date   NA 136 03/21/2020   NA 139 03/20/2020   NA 134 (L) 03/19/2020   K 5.2 (H) 03/21/2020   K 4.4 03/20/2020   K 4.7 03/19/2020   CL 99 03/21/2020   CL 102 03/20/2020   CL 94 (L) 03/19/2020   CO2 23  03/21/2020   CO2 25 03/20/2020   CO2 24 03/19/2020   GLUCOSE 130 (H) 03/21/2020   GLUCOSE 124 (H) 03/20/2020   GLUCOSE 117 (H) 03/19/2020   BUN 59 (H) 03/21/2020   BUN 42 (H) 03/20/2020   BUN 47 (H) 03/19/2020   CREATININE 2.38 (H) 03/21/2020   CREATININE 1.86 (H) 03/20/2020   CREATININE 1.82 (H) 03/19/2020   CALCIUM 8.6 (L) 03/21/2020   CALCIUM 8.6 (L) 03/20/2020   CALCIUM 8.8 (L) 03/19/2020   LFT Recent Labs    03/19/20 1644  PROT 6.7  ALBUMIN 3.1*  AST 22  ALT 8  ALKPHOS 56  BILITOT 1.2   PT/INR No results found for: INR, PROTIME Hepatitis Panel No results for input(s): HEPBSAG, HCVAB, HEPAIGM, HEPBIGM in the last 72 hours. C-Diff No components found for: CDIFF Lipase     Component Value Date/Time   LIPASE 22 06/12/2019 1600    Drugs of Abuse  No results found for: LABOPIA, COCAINSCRNUR, LABBENZ, AMPHETMU, THCU, LABBARB   RADIOLOGY STUDIES: CT Head Wo Contrast  Result Date: 03/19/2020 CLINICAL DATA:  86 year old female recently hospitalized with CHF. Seizure last night. EXAM: CT HEAD WITHOUT CONTRAST TECHNIQUE: Contiguous axial images were obtained from the base of the skull through the vertex without intravenous contrast. COMPARISON:  None. FINDINGS: Brain: No midline shift, mass effect, or evidence of intracranial mass lesion. No acute intracranial hemorrhage identified. No ventriculomegaly. Patchy and confluent widespread cerebral white matter hypodensity, including deep white matter capsule involvement which appears greater in the left hemisphere. Comparatively mild heterogeneity in the bilateral deep gray nuclei. Small chronic appearing infarct of the right superior cerebellum. Occasional small areas of cortical encephalomalacia, including in the left middle frontal gyrus on coronal image 23. No cortically based acute infarct identified. Vascular: Calcified atherosclerosis at the skull base. Skull: No acute osseous abnormality identified. Sinuses/Orbits: Mild  bubbly opacity in the right sphenoid. Other Visualized paranasal sinuses and mastoids are clear. Other: No acute orbit or scalp soft tissue finding. IMPRESSION: Advanced chronic ischemic disease with no acute intracranial abnormality by CT. Electronically Signed   By: Odessa FlemingH  Hall M.D.   On: 03/19/2020 17:00   MR ANGIO HEAD WO CONTRAST  Result Date: 03/20/2020 CLINICAL DATA:  Neuro deficit, acute, stroke suspected. EXAM: MRA HEAD WITHOUT CONTRAST TECHNIQUE: Angiographic images of the Circle of Willis were obtained using MRA technique without intravenous contrast. COMPARISON:  Brain MRI 03/20/2020, head CT 03/19/2020 FINDINGS: The intracranial internal carotid arteries are patent. The M1 middle cerebral arteries are patent without significant stenosis. No M2 proximal branch occlusion or high-grade proximal stenosis is identified. The anterior cerebral arteries are patent. The visualized intracranial vertebral arteries are patent. The intracranial left vertebral artery is dominant. The basilar artery is patent. The posterior cerebral arteries are patent proximally without significant stenosis. Posterior communicating arteries are hypoplastic or absent bilaterally. No intracranial aneurysm is identified. IMPRESSION: No intracranial large vessel occlusion or proximal high-grade stenosis. Electronically Signed   By: Jackey LogeKyle  Golden DO  On: 03/20/2020 15:40   MR ANGIO NECK WO CONTRAST  Result Date: 03/20/2020 CLINICAL DATA:  Neuro deficit, acute, stroke suspected. EXAM: MRA NECK WITHOUT CONTRAST TECHNIQUE: Angiographic images of the neck were obtained using MRA technique without intravenous contrast. Carotid stenosis measurements (when applicable) are obtained utilizing NASCET criteria, using the distal internal carotid diameter as the denominator. COMPARISON:  Noncontrast brain MRI 03/20/2020, concurrently performed MRA head 03/20/2020, head CT 03/19/2020. FINDINGS: Aberrant right subclavian artery. Motion degradation and  non-contrast technique precludes adequate evaluation for stenosis within the proximal right subclavian artery. No hemodynamically significant stenosis of the proximal left subclavian artery. The bilateral common and internal carotid arteries are patent within the neck without evidence of hemodynamically significant stenosis (50% or greater). There is atherosclerotic plaque within both carotid bifurcations and proximal internal carotid arteries. Non-contrast technique and motion artifact precludes adequate evaluation of the origins of the vertebral arteries. Within this limitation, the vertebral arteries are patent within the neck bilaterally with antegrade flow and without appreciable significant stenosis. The left vertebral artery is dominant. IMPRESSION: The bilateral common and internal carotid arteries are patent within the neck without evidence of hemodynamically significant stenosis. Atherosclerotic plaque is present within both carotid bifurcations and proximal internal carotid arteries. Non-contrast technique and motion artifact precludes adequate evaluation of the origins of the vertebral arteries. Within this limitation, the cervical vertebral arteries are patent within the neck without appreciable significant stenosis. Aberrant right subclavian artery. Electronically Signed   By: Jackey Loge DO   On: 03/20/2020 15:51   MR BRAIN WO CONTRAST  Result Date: 03/20/2020 CLINICAL DATA:  Nontraumatic seizure. Recent hypertensive episode and admission for CHF. EXAM: MRI HEAD WITHOUT CONTRAST TECHNIQUE: Multiplanar, multiecho pulse sequences of the brain and surrounding structures were obtained without intravenous contrast. COMPARISON:  Head CT from yesterday FINDINGS: Brain: Punctate acute to subacute infarcts along the bilateral cerebral convexity and right caudate body, roughly along the cerebral watershed territories. A small more linear subacute appearing infarct is seen along the high left frontal cortex.  Confluent chronic small vessel ischemic gliosis in the cerebral white matter. Small remote bilateral cerebellar infarcts. Remote cerebral hemispheric infarcts. Age normal brain volume. No acute hemorrhage, hydrocephalus, or masslike finding. There have been a few remote microhemorrhages with nonspecific pattern given the limited degree. Vascular: Normal flow voids Skull and upper cervical spine: Normal marrow signal Sinuses/Orbits: Bilateral cataract resection. Small proteinaceous nasopharyngeal cyst on the right. IMPRESSION: 1. Small acute to subacute infarcts along the bilateral cerebral cortex, likely embolic. 2. Background of advanced chronic small vessel disease. Electronically Signed   By: Marnee Spring M.D.   On: 03/20/2020 04:16   DG Chest Port 1 View  Result Date: 03/19/2020 CLINICAL DATA:  Low blood pressure. EXAM: PORTABLE CHEST 1 VIEW COMPARISON:  A 08/12/2019 FINDINGS: The heart size remains enlarged. The patient is status post prior median sternotomy. There are hazy bilateral airspace opacities with prominent interstitial lung markings. Aortic calcifications are again noted. There is no pneumothorax. There is no acute osseous abnormality. There may be small bilateral pleural effusions. IMPRESSION: Cardiomegaly with findings of congestive heart failure. Electronically Signed   By: Katherine Mantle M.D.   On: 03/19/2020 17:00   EEG adult  Result Date: 03/20/2020 Charlsie Quest, MD     03/20/2020 11:02 AM Patient Name: Morgan Holland MRN: [id number] Epilepsy Attending: Charlsie Quest Referring Physician/Provider: Dr Odie Sera Date: 03/20/2020 Duration: 23.17 mins Patient history: 86 year old female with seizure-like activity.  EEG to evaluate for  seizures. Level of alertness: Awake, asleep AEDs during EEG study: None Technical aspects: This EEG study was done with scalp electrodes positioned according to the 10-20 International system of electrode placement. Electrical activity was acquired  at a sampling rate of  and reviewed with a high frequency filter of  and a low frequency filter of . EEG data were recorded continuously and digitally stored. Description: The posterior dominant rhythm consists of 8-9 Hz activity of moderate voltage (25-35 uV) seen predominantly in posterior head regions, symmetric and reactive to eye opening and eye closing. Sleep was characterized by vertex waves, sleep spindles (12 to 14 Hz), maximal frontocentral region.    Hyperventilation and photic stimulation were not performed.   IMPRESSION: This study is within normal limits. No seizures or epileptiform discharges were seen throughout the recording. Priyanka Annabelle Harman     IMPRESSION:   *  [place] anemia.  Low iron, low ferritin, low iron sats, low folate, ok B 12 levels 03/13/20.  Folic acid and po b12 added to chronic FeSO4 bid during admission 1 wk ago.  Required 1 PRBC and got Feraheme then as well.   Anemia is chronic, dates back many years. Had gastritis, diverticulosis and small polyp on egd/colon in 2018.   *   Chronic N/V.  Hx GERD on chronic omeprazole 40 bid.  Zofran prn.  Wonder if the chronic po iron may be contributing to gastric upset?     *   Dysphagia, globus sensation at level UES.  BS swallow eval this am. SLP dx'd  esophageal dysphagia, cleared for D 3 and thin liquids and ordered MBSS.    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  On SQ Heparin.    *   Severe prosthetic AV stenosis.  TAVR was under consideration.    *   Acute on chronic CHF.    *    AKI.  Given embolic CNS findings, ? Could she have had emboli to kidneys/renal arteries?.  Dr Signe Colt is seeing pt.  *   Hypothyroidism.  TSH not checked since 2018.    PLAN:     *   Check TSH.    *   ? EGD.  Colonoscopy may be too much for this frail, aged pt.  *   Ba esophagram is preferable to MBSS as it will assess for dysmotility, presbyesophagus, masses, stricture.  Messaged SLP Vear Clock re  this.    *   ? Stop oral iron, give additional feraheme instead?, see if it helps the nausea?    Jennye Moccasin  03/21/2020, 8:36 AM Phone [phone]  2:53 PM addendum: Esophagram:  Narrowing of the distal esophagus at the gastroesophageal junction likely responsible for holdup of contrast seen throughout the study. Retained contrast in the esophagus corresponded to symptoms.  Given findings, pt may benefit from EGD w  esoph dilatation.    Plan: EGD w possible esoph dilation tmrw, time TBD.  Hold SQ Heparin after midnight to allow for dilation, will need resumed after egd.

## 2020-03-21 NOTE — Plan of Care (Signed)
  Problem: Education: Goal: Knowledge of General Education information will improve Description: Including pain rating scale, medication(s)/side effects and non-pharmacologic comfort measures Outcome: Progressing   Problem: Health Behavior/Discharge Planning: Goal: Ability to manage health-related needs will improve Outcome: Progressing   Problem: Elimination: Goal: Will not experience complications related to bowel motility Outcome: Progressing   Problem: Coping: Goal: Level of anxiety will decrease Outcome: Progressing

## 2020-03-21 NOTE — Progress Notes (Addendum)
PROGRESS NOTE    Morgan Holland  IZT:[id number] DOB: May 27, 1927 DOA: 03/19/2020 PCP: Ardyth Gal, MD   Chief Complaint  Patient presents with  . Hypotension   Brief Narrative:As per HPI:" 86 y.o. female with medical history significant for recent diagnosis of systolic CHF with EF 40 to 45%, severe aortic stenosis, hx of covid, chronic kidney disease, chronic anemia, hypothyroidism, and hypertension, now presenting to the emergency department with low blood pressures and seizure-like episode.  Patient was discharged from the hospital on 03/16/2020 after admission for newly diagnosed CHF.  Since returning home, her systolic blood pressure has been in the 80s much of the time and her granddaughter whom she lives with has been holding her metoprolol.  Patient complains of intermittent nausea without vomiting and states that she has been experiencing this ever since she was hospitalized with Covid last December.  She denies any chest pain or palpitations, has not noticed any leg swelling, and does not feel particularly short of breath.  Per report of her granddaughter, the patient was preparing to eat lunch on 03/18/2020 when she began shaking all over and would not respond to questioning.  This lasted 15 to 20 seconds and was followed by a period of somnolence.  Granddaughter has a child with epilepsy and states that this looked just like generalized seizures she has seen before along with a postictal period.  Patient reports that she was experiencing nausea prior to the episode, but again states that she has had this frequently for the past several months.She has never had any seizure-like episodes previously per family report.  [facility] ED Course:Upon arrival to the ED, patient is found to be afebrile, saturating low 90s on room air, and with blood pressure 90/70.  EKG features sinus rhythm with T wave inversions.  Head CT is notable for advanced chronic ischemic disease but no acute intracranial abnormality.   Chest x-ray with cardiomegaly, prominent interstitial markings, and possible small bilateral pleural effusions.  Chemistry panel reveals a creatinine 1.87, up from 1.51 two days earlier.CBC notable for stable normocytic anemia.  Troponin is elevated to 796 and BNP is elevated 4372.Covid PCR is negative.  Cardiology was consulted and was admitted" Per daughter PT would not come on Monday when she had seizure like episode and BP was low in 80/50 and feeling bad at home so granddaughter brought to hospital on tuesday.  Subjective:  Heard of hearing, short of breath on exertion.  On RA. Creat worsening this am BP soft  Assessment & Plan:  Acute on chronic systolic CHF: lvef 40-45%, bnp was 4371. Cards on board-use lasix as needed for shortness of breath-f/u limited echo. hold IVF. Monitor volume status. Her weight is lower than her last weight. Wt Readings from Last 3 Encounters:  03/21/20 55.5 kg  03/16/20 56.1 kg  06/12/19 62.5 kg   AKI on CKD IIIb: likely from her hypotension at home, and other meds-ace/arb/diuretics. Creat 1,8>1.8> 2.38 ,was 1.3-1.5 on last d/c on 9.4.21.lasix on hold. Discussed w/ cardio do not advise ivf. spoke w nephro on board- checking renal US and UA. Monitor uop. Recent Labs  Lab 03/15/20 0442 03/16/20 0822 03/19/20 1644 03/20/20 0035 03/21/20 0658  BUN 25* 24* 47* 42* 59*  CREATININE 1.32* 1.51* 1.82* 1.86* 2.38*   Small bilateral acute to subacute infarcts: She had  observed seizure-like activityat home- for which underwent  MRI showed "Small acute to subacute infarcts along the bilateral cerebral cortex, likely embolic. 2. Background of advanced chronic small vessel  disease" appreciate neurology input on board wondering if the stroke was related to hypotension/hypoperfusion.Continue aspirin 81, lipitor.MRA neck and MRA head no acute finding.  Her ldl is 75.  Hemoglobin A1c 4.8. Pt.ot.slp eval.  Hyperkalemia: 2/2 AKI, cont to monitor.  Dysphagia with globus  sensation at level of ZOX:WRUEAV on board barium swallow eval plan  Hypotension/Hx of WUJ:WJXBJ pressure stable in 90s and soft.Lasix, metoprolol remains on hold.  Her hydralazine and lisinopril were discontinued on last discharge.  Elevated troponin:796>769.No chest pain.Cardio has been consulted likely demand mismatch.Continue aspirin 81.  Hypothyroidism:cont  home Synthroid.  Severe aortic stenosis:followed by cardiology. Repeat echo pending.at risk of flask Pulm Edema, avoid ivf.  Iron deficiency anemia:H&H 8.8, moinitor. GI eval per cardio request. On chronic iron supplementation recently added folic acid and B12 on admission a week ago required 1 unit PRBC and Feraheme as well.  History of gastritis diverticulosis and small polyp. Recent Labs  Lab 03/15/20 0442 03/16/20 0827 03/19/20 1644 03/20/20 0035 03/21/20 0658  HGB 8.8* 9.7* 9.4* 8.3* 8.8*  HCT 28.9* 32.0* 30.4* 27.6* 28.4*   GOC: at risk of decompensation- palliative consulted given patient's renal failure, acute stroke as well as complex comorbid cardiac conditions severe aortic stenosis in the setting of advanced age.  DVT prophylaxis: heparin injection 5,000 Units Start: 03/20/20 0015 Code Status:  Code Status: Full Code spoke w granddaughter 03/20/20. Family Communication: plan of care discussed with patient at bedside.  I had called and updated patient's granddaughter over the phone 03/20/20. Will await palliative care input and then will update family. Updated daughter this evening   Status is: Inpatient  Remains inpatient appropriate becauseLOngoing diagnostic testing needed not appropriate for outpatient work up, IV treatments appropriate due to intensity of illness or inability to take PO and Inpatient level of care appropriate due to severity of illness  Dispo: The patient is from: Home              Anticipated d/c is to: tbd              Anticipated d/c date is TBD              Patient currently is not medically  stable to d/c.  Remains hospitalized due to ongoing management of renal failure, aortic stenosis, anemia.  Nutrition: Diet Order            DIET DYS 3 Room service appropriate? Yes with Assist; Fluid consistency: Thin  Diet effective ____                  Body mass index is 22.39 kg/m. Pressure Ulcer: Pressure Injury 03/19/20 Sacrum Medial Stage 2 -  Partial thickness loss of dermis presenting as a shallow open injury with a red, pink wound bed without slough. (Active)  03/19/20 2315  Location: Sacrum  Location Orientation: Medial  Staging: Stage 2 -  Partial thickness loss of dermis presenting as a shallow open injury with a red, pink wound bed without slough.  Wound Description (Comments):   Present on Admission: Yes    Consultants:see note  Procedures:see note Microbiology:see note Blood Culture    Component Value Date/Time   SDES  03/12/2020 2354    URINE, RANDOM Performed at [facility], [address] [place], [place] [address]    [facility]  03/12/2020 2354    NONE Performed at [facility], [address] [place], [place] [address]    CULT MULTIPLE SPECIES PRESENT,  SUGGEST RECOLLECTION (A) 03/12/2020 2354   REPTSTATUS 03/13/2020 FINAL 03/12/2020 2354    Other culture-see note  Medications: Scheduled Meds: . aspirin EC  81 mg Oral Daily  . atorvastatin  20 mg Oral Daily  . heparin  5,000 Units Subcutaneous Q8H  . levothyroxine  100 mcg Oral Q0600  . pantoprazole  40 mg Oral Daily  . sodium chloride flush  3 mL Intravenous Q12H   Continuous Infusions: . sodium chloride      Antimicrobials: Anti-infectives (From admission, onward)   None     Objective: Vitals: Today's Vitals   03/21/20 0007 03/21/20 0335 03/21/20 0413 03/21/20 0920  BP: (!) 96/53 (!) 88/52 90/65 92/71   Pulse: 89 85 84 89  Resp: 19 17  20   Temp: (!) 97.5 F (36.4 C) (!) 97.4 F (36.3 C)  98.5 F (36.9 C)  TempSrc: Oral Oral  Oral  SpO2: 95% 97%  99%   Weight: 55.5 kg     Height:      PainSc:        Intake/Output Summary (Last 24 hours) at 03/21/2020 1059 Last data filed at 03/20/2020 2247 Gross per 24 hour  Intake 353.34 ml  Output --  Net 353.34 ml   Filed Weights   03/19/20 1404 03/20/20 0526 03/21/20 0007  Weight: 56.1 kg 55.4 kg 55.5 kg   Weight change: -0.58 kg  Intake/Output from previous day: 09/08 0701 - 09/09 0700 In: 353.3 [P.O.:100; I.V.:253.3] Out: -  Intake/Output this shift: No intake/output data recorded.  Examination: General exam: AAO, hard of hearing,NAD, weak appearing. HEENT:Oral mucosa moist, Ear/Nose WNL grossly, dentition normal. Respiratory system: bilaterally clear,no wheezing or crackles,no use of accessory muscle Cardiovascular system: S1 & S2 +, Murmur+,No JVD,. Gastrointestinal system: Abdomen soft, NT,ND, BS+ Nervous System:Alert, awake, moving extremities and grossly nonfocal Extremities: No edema, distal peripheral pulses palpable.  Skin: No rashes,no icterus. MSK: Normal muscle bulk,tone, power  Data Reviewed: I have personally reviewed following labs and imaging studies CBC: Recent Labs  Lab 03/15/20 0442 03/16/20 0827 03/19/20 1644 03/20/20 0035 03/21/20 0658  WBC 8.5 10.3 8.8 9.5 9.7  NEUTROABS  --   --  6.0  --   --   HGB 8.8* 9.7* 9.4* 8.3* 8.8*  HCT 28.9* 32.0* 30.4* 27.6* 28.4*  MCV 93.2 94.7 95.6 95.8 96.9  PLT 267 326 255 244 250   Basic Metabolic Panel: Recent Labs  Lab 03/15/20 0442 03/16/20 0822 03/19/20 1644 03/20/20 0035 03/21/20 0658  NA 140 134* 134* 139 136  K 4.3 3.9 4.7 4.4 5.2*  CL 101 99 94* 102 99  CO2 29 25 24 25 23   GLUCOSE 107* 130* 117* 124* 130*  BUN 25* 24* 47* 42* 59*  CREATININE 1.32* 1.51* 1.82* 1.86* 2.38*  CALCIUM 8.7* 8.7* 8.8* 8.6* 8.6*  MG  --   --  1.7  --   --    GFR: Estimated Creatinine Clearance: 12.2 mL/min (A) (by C-G formula based on SCr of 2.38 mg/dL (H)). Liver Function Tests: Recent Labs  Lab 03/19/20 1644  AST  22  ALT 8  ALKPHOS 56  BILITOT 1.2  PROT 6.7  ALBUMIN 3.1*   No results for input(s): LIPASE, AMYLASE in the last 168 hours. No results for input(s): AMMONIA in the last 168 hours. Coagulation Profile: No results for input(s): INR, PROTIME in the last 168 hours. Cardiac Enzymes: No results for input(s): CKTOTAL, CKMB, CKMBINDEX, TROPONINI in the last 168 hours. BNP (last 3 results) No results  for input(s): PROBNP in the last 8760 hours. HbA1C: No results for input(s): HGBA1C in the last 72 hours. CBG: No results for input(s): GLUCAP in the last 168 hours. Lipid Profile: No results for input(s): CHOL, HDL, LDLCALC, TRIG, CHOLHDL, LDLDIRECT in the last 72 hours. Thyroid Function Tests: No results for input(s): TSH, T4TOTAL, FREET4, T3FREE, THYROIDAB in the last 72 hours. Anemia Panel: No results for input(s): VITAMINB12, FOLATE, FERRITIN, TIBC, IRON, RETICCTPCT in the last 72 hours. Sepsis Labs: No results for input(s): PROCALCITON, LATICACIDVEN in the last 168 hours.  Recent Results (from the past 240 hour(s))  Culture, blood (routine x 2)     Status: None   Collection Time: 03/12/20  5:50 PM   Specimen: BLOOD  Result Value Ref Range Status   Specimen Description   Final    BLOOD RIGHT ANTECUBITAL Performed at [facility], [address] [place], [place] [address]    Special Requests   Final    BOTTLES DRAWN AEROBIC AND ANAEROBIC Blood Culture adequate volume Performed at [facility], [address] [place], [place] [address]    Culture   Final    NO GROWTH 5 DAYS Performed at [facility], [address] [place], [place] [address]    Report Status 03/17/2020 FINAL  Final  SARS Coronavirus 2 by RT PCR (hospital order, performed in [facility] hospital lab) Nasopharyngeal Peripheral     Status: None   Collection Time: 03/12/20  5:52 PM   Specimen: Peripheral; Nasopharyngeal  Result Value Ref Range Status   SARS Coronavirus 2 NEGATIVE  NEGATIVE Final    Comment: (NOTE) SARS-CoV-2 target nucleic acids are NOT DETECTED.  The SARS-CoV-2 RNA is generally detectable in upper and lower respiratory specimens during the acute phase of infection. The lowest concentration of SARS-CoV-2 viral copies this assay can detect is 250 copies / mL. A negative result does not preclude SARS-CoV-2 infection and should not be used as the sole basis for treatment or other patient management decisions.  A negative result may occur with improper specimen collection / handling, submission of specimen other than nasopharyngeal swab, presence of viral mutation(s) within the areas targeted by this assay, and inadequate number of viral copies (<250 copies / mL). A negative result must be combined with clinical observations, patient history, and epidemiological information.  Fact Sheet for Patients:   BoilerBrush.com.cy  Fact Sheet for Healthcare Providers: https://pope.com/  This test is not yet approved or  cleared by the [place] FDA and has been authorized for detection and/or diagnosis of SARS-CoV-2 by FDA under an Emergency Use Authorization (EUA).  This EUA will remain in effect (meaning this test can be used) for the duration of the COVID-19 declaration under Section 564(b)(1) of the Act, 21 U.S.C. section 360bbb-3(b)(1), unless the authorization is terminated or revoked sooner.  Performed at [facility] [address] [place], [place] [address]   Culture, blood (routine x 2)     Status: None   Collection Time: 03/12/20  6:07 PM   Specimen: BLOOD  Result Value Ref Range Status   Specimen Description   Final    BLOOD LEFT ANTECUBITAL Performed at [facility], [address] [place], [place] [address]    Special Requests   Final    BOTTLES DRAWN AEROBIC AND ANAEROBIC Blood Culture adequate volume Performed at [facility] [address] [place], [place] [address]  Culture   Final    NO GROWTH 5 DAYS Performed at [facility], [address], [place], [place] [address]    Report Status 03/17/2020 FINAL  Final  Urine culture     Status: Abnormal   Collection Time: 03/12/20 11:54 PM   Specimen: Urine, Random  Result Value Ref Range Status   Specimen Description   Final    URINE, RANDOM Performed at [facility], [address] [place], [place] [address]    Special Requests   Final    NONE Performed at [facility], [address] [place], [place] [address]    Culture MULTIPLE SPECIES PRESENT, SUGGEST RECOLLECTION (A)  Final   Report Status 03/13/2020 FINAL  Final  SARS Coronavirus 2 by RT PCR (hospital order, performed in [facility] hospital lab) Nasopharyngeal Nasopharyngeal Swab     Status: None   Collection Time: 03/19/20  7:52 PM   Specimen: Nasopharyngeal Swab  Result Value Ref Range Status   SARS Coronavirus 2 NEGATIVE NEGATIVE Final    Comment: (NOTE) SARS-CoV-2 target nucleic acids are NOT DETECTED.  The SARS-CoV-2 RNA is generally detectable in upper and lower respiratory specimens during the acute phase of infection. The lowest concentration of SARS-CoV-2 viral copies this assay can detect is 250 copies / mL. A negative result does not preclude SARS-CoV-2 infection and should not be used as the sole basis for treatment or other patient management decisions.  A negative result may occur with improper specimen collection / handling, submission of specimen other than nasopharyngeal swab, presence of viral mutation(s) within the areas targeted by this assay, and inadequate number of viral copies (<250 copies / mL). A negative result must be combined with clinical observations, patient history, and epidemiological information.  Fact Sheet for Patients:   BoilerBrush.com.cy  Fact Sheet for Healthcare  Providers: https://pope.com/  This test is not yet approved or  cleared by the [place] FDA and has been authorized for detection and/or diagnosis of SARS-CoV-2 by FDA under an Emergency Use Authorization (EUA).  This EUA will remain in effect (meaning this test can be used) for the duration of the COVID-19 declaration under Section 564(b)(1) of the Act, 21 U.S.C. section 360bbb-3(b)(1), unless the authorization is terminated or revoked sooner.  Performed at [facility], [address], [place], [place] [address]      Radiology Studies: CT Head Wo Contrast  Result Date: 03/19/2020 CLINICAL DATA:  86 year old female recently hospitalized with CHF. Seizure last night. EXAM: CT HEAD WITHOUT CONTRAST TECHNIQUE: Contiguous axial images were obtained from the base of the skull through the vertex without intravenous contrast. COMPARISON:  None. FINDINGS: Brain: No midline shift, mass effect, or evidence of intracranial mass lesion. No acute intracranial hemorrhage identified. No ventriculomegaly. Patchy and confluent widespread cerebral white matter hypodensity, including deep white matter capsule involvement which appears greater in the left hemisphere. Comparatively mild heterogeneity in the bilateral deep gray nuclei. Small chronic appearing infarct of the right superior cerebellum. Occasional small areas of cortical encephalomalacia, including in the left middle frontal gyrus on coronal image 23. No cortically based acute infarct identified. Vascular: Calcified atherosclerosis at the skull base. Skull: No acute osseous abnormality identified. Sinuses/Orbits: Mild bubbly opacity in the right sphenoid. Other Visualized paranasal sinuses and mastoids are clear. Other: No acute orbit or scalp soft tissue finding. IMPRESSION: Advanced chronic ischemic disease with no acute intracranial abnormality by CT. Electronically Signed   By:  H  Hall M.D.   On: 03/19/2020  17:00   MR ANGIO HEAD WO CONTRAST  Result Date: 03/20/2020 CLINICAL DATA:  Neuro deficit, acute, stroke suspected. EXAM: MRA HEAD WITHOUT CONTRAST TECHNIQUE: Angiographic images of the Circle of [place] were obtained using MRA technique without intravenous contrast. COMPARISON:  Brain MRI 03/20/2020, head CT 03/19/2020 FINDINGS: The intracranial internal carotid arteries are patent. The M1 middle cerebral arteries are patent without significant stenosis. No M2 proximal branch occlusion or high-grade proximal stenosis is identified. The anterior cerebral arteries are patent. The visualized intracranial vertebral arteries are patent. The intracranial left vertebral artery is dominant. The basilar artery is patent. The posterior cerebral arteries are patent proximally without significant stenosis. Posterior communicating arteries are hypoplastic or absent bilaterally. No intracranial aneurysm is identified. IMPRESSION: No intracranial large vessel occlusion or proximal high-grade stenosis. Electronically Signed   By: Jackey Loge DO   On: 03/20/2020 15:40   MR ANGIO NECK WO CONTRAST  Result Date: 03/20/2020 CLINICAL DATA:  Neuro deficit, acute, stroke suspected. EXAM: MRA NECK WITHOUT CONTRAST TECHNIQUE: Angiographic images of the neck were obtained using MRA technique without intravenous contrast. Carotid stenosis measurements (when applicable) are obtained utilizing NASCET criteria, using the distal internal carotid diameter as the denominator. COMPARISON:  Noncontrast brain MRI 03/20/2020, concurrently performed MRA head 03/20/2020, head CT 03/19/2020. FINDINGS: Aberrant right subclavian artery. Motion degradation and non-contrast technique precludes adequate evaluation for stenosis within the proximal right subclavian artery. No hemodynamically significant stenosis of the proximal left subclavian artery. The bilateral common and internal carotid arteries are patent within the neck without evidence of  hemodynamically significant stenosis (50% or greater). There is atherosclerotic plaque within both carotid bifurcations and proximal internal carotid arteries. Non-contrast technique and motion artifact precludes adequate evaluation of the origins of the vertebral arteries. Within this limitation, the vertebral arteries are patent within the neck bilaterally with antegrade flow and without appreciable significant stenosis. The left vertebral artery is dominant. IMPRESSION: The bilateral common and internal carotid arteries are patent within the neck without evidence of hemodynamically significant stenosis. Atherosclerotic plaque is present within both carotid bifurcations and proximal internal carotid arteries. Non-contrast technique and motion artifact precludes adequate evaluation of the origins of the vertebral arteries. Within this limitation, the cervical vertebral arteries are patent within the neck without appreciable significant stenosis. Aberrant right subclavian artery. Electronically Signed   By: Jackey Loge DO   On: 03/20/2020 15:51   MR BRAIN WO CONTRAST  Result Date: 03/20/2020 CLINICAL DATA:  Nontraumatic seizure. Recent hypertensive episode and admission for CHF. EXAM: MRI HEAD WITHOUT CONTRAST TECHNIQUE: Multiplanar, multiecho pulse sequences of the brain and surrounding structures were obtained without intravenous contrast. COMPARISON:  Head CT from yesterday FINDINGS: Brain: Punctate acute to subacute infarcts along the bilateral cerebral convexity and right caudate body, roughly along the cerebral watershed territories. A small more linear subacute appearing infarct is seen along the high left frontal cortex. Confluent chronic small vessel ischemic gliosis in the cerebral white matter. Small remote bilateral cerebellar infarcts. Remote cerebral hemispheric infarcts. Age normal brain volume. No acute hemorrhage, hydrocephalus, or masslike finding. There have been a few remote microhemorrhages  with nonspecific pattern given the limited degree. Vascular: Normal flow voids Skull and upper cervical spine: Normal marrow signal Sinuses/Orbits: Bilateral cataract resection. Small proteinaceous nasopharyngeal cyst on the right. IMPRESSION: 1. Small acute to subacute infarcts along the bilateral cerebral cortex, likely embolic. 2. Background of advanced chronic small vessel disease. Electronically Signed   BOdessa Flemingy:  Marnee Spring M.D.   On: 03/20/2020 04:16   DG Chest Port 1 View  Result Date: 03/19/2020 CLINICAL DATA:  Low blood pressure. EXAM: PORTABLE CHEST 1 VIEW COMPARISON:  A 08/12/2019 FINDINGS: The heart size remains enlarged. The patient is status post prior median sternotomy. There are hazy bilateral airspace opacities with prominent interstitial lung markings. Aortic calcifications are again noted. There is no pneumothorax. There is no acute osseous abnormality. There may be small bilateral pleural effusions. IMPRESSION: Cardiomegaly with findings of congestive heart failure. Electronically Signed   By: Katherine Mantle M.D.   On: 03/19/2020 17:00   EEG adult  Result Date: 03/20/2020 Charlsie Quest, MD     03/20/2020 11:02 AM Patient Name: William Laske MRN: [id number] Epilepsy Attending: Charlsie Quest Referring Physician/Provider: Dr Odie Sera Date: 03/20/2020 Duration: 23.17 mins Patient history: 86 year old female with seizure-like activity.  EEG to evaluate for seizures. Level of alertness: Awake, asleep AEDs during EEG study: None Technical aspects: This EEG study was done with scalp electrodes positioned according to the 10-20 International system of electrode placement. Electrical activity was acquired at a sampling rate of 500Hz  and reviewed with a high frequency filter of 70Hz  and a low frequency filter of 1Hz . EEG data were recorded continuously and digitally stored. Description: The posterior dominant rhythm consists of 8-9 Hz activity of moderate voltage (25-35 uV) seen  predominantly in posterior head regions, symmetric and reactive to eye opening and eye closing. Sleep was characterized by vertex waves, sleep spindles (12 to 14 Hz), maximal frontocentral region.    Hyperventilation and photic stimulation were not performed.   IMPRESSION: This study is within normal limits. No seizures or epileptiform discharges were seen throughout the recording.     LOS: 2 days   , MD [facility]  03/21/2020, 10:59 AM

## 2020-03-21 NOTE — Evaluation (Addendum)
Clinical/Bedside Swallow Evaluation Patient Details  Name: Morgan Holland MRN: [id number] Date of Birth: [birth year]  Today's Date: 03/21/2020 Time: SLP Start Time (ACUTE ONLY): 0912 SLP Stop Time (ACUTE ONLY): 0933 SLP Time Calculation (min) (ACUTE ONLY): 21 min  Past Medical History:  Past Medical History:  Diagnosis Date  . Aortic stenosis, severe   . Hypertension   . MI (myocardial infarction) (HCC)   . S/P AVR   . Thyroid disease    Past Surgical History:  Past Surgical History:  Procedure Laterality Date  . AORTIC VALVE REPLACEMENT     HPI:  Pt is a 86 y.o. female with medical history significant for recent diagnosis of systolic CHF with EF 40 to 45%, severe aortic stenosis, chronic kidney disease, chronic anemia, hypothyroidism, and hypertension, who presented to the ED with low blood pressures and seizure-like episode. MRI brain  9/9: Small acute to subacute infarcts along the bilateral cerebral cortex, likely embolic. CXR 9/7 consistent with CHF.   Assessment / Plan / Recommendation Clinical Impression  Pt was seen for bedside swallow evaluation. She reported that for the past few months she has had the sensation of a "knot" in her throat and foods "just won't go down" and that she needs to drink water to help with this. She stated that she typically eats soft foods since they are easier. Oral mechanism exam was [facility] and dentition was adequate with dentures and natural teeth. She tolerated all solids and liquids without signs or symptoms of aspiration. She exhibited symptoms of oropharyngeal dysphagia characterized by reported difficulty with A-P transport, reports of globus sensation, and multiple swallows indicating possible pharyngeal residue which was improved with liquid washes. Symptoms of esophageal dysphagia were also noted including eructation, globus sensation, and reports of liquids "not going down". A dysphagia 3 diet with thin liquids will be initiated at this time. A  modified barium swallow study will be conducted (scheduled for 1300) to further assess the oropharyngeal phase, but, in view of the pt's symptoms, evaluation of the esophageal phase of the swallow should also be considered. Case was discussed with Maralyn Sago, PA for GI subsequent to completion of this note and it was agreed that the esophageal phase of the pt's swallow be evaluated first and that the modified barium swallow study be deferred.  SLP Visit Diagnosis: Dysphagia, unspecified (R13.10)    Aspiration Risk  Mild aspiration risk    Diet Recommendation Dysphagia 3 (Mech soft);Thin liquid   Liquid Administration via: Cup;Straw Medication Administration: Whole meds with puree Supervision: Patient able to self feed Compensations: Slow rate;Follow solids with liquid;Small sips/bites Postural Changes: Remain upright for at least 30 minutes after po intake    Other  Recommendations Recommended Consults: Consider esophageal assessment Oral Care Recommendations: Oral care BID   Follow up Recommendations Other (comment) (TBD)      Frequency and Duration min 1 x/week  1 week       Prognosis        Swallow Study   General Date of Onset: 03/20/20 HPI: Pt is a 86 y.o. female with medical history significant for recent diagnosis of systolic CHF with EF 40 to 45%, severe aortic stenosis, chronic kidney disease, chronic anemia, hypothyroidism, and hypertension, who presented to the ED with low blood pressures and seizure-like episode. MRI brain  9/9: Small acute to subacute infarcts along the bilateral cerebral cortex, likely embolic. CXR 9/7 consistent with CHF. Type of Study: Bedside Swallow Evaluation Previous Swallow Assessment: None Diet Prior to this Study:  NPO Temperature Spikes Noted: No Respiratory Status: Room air History of Recent Intubation: No Behavior/Cognition: Alert;Cooperative;Pleasant mood Oral Cavity Assessment: Within Functional Limits Oral Care Completed by SLP: No Oral  Cavity - Dentition: Dentures, top;Adequate natural dentition Vision: Functional for self-feeding Self-Feeding Abilities: Able to feed self Patient Positioning: Upright in bed Baseline Vocal Quality: Normal Volitional Cough: Strong Volitional Swallow: Able to elicit    Oral/Motor/Sensory Function Overall Oral Motor/Sensory Function: Within functional limits   Ice Chips Ice chips: Within functional limits Presentation: Spoon   Thin Liquid Thin Liquid: Within functional limits Presentation: Cup;Straw    Nectar Thick Nectar Thick Liquid: Not tested   Honey Thick Honey Thick Liquid: Not tested   Puree Puree: Impaired Presentation: Spoon Oral Phase Functional Implications: Prolonged oral transit Pharyngeal Phase Impairments: Multiple swallows (reports of globus sensation)   Solid     Solid: Impaired Presentation: Self Fed Pharyngeal Phase Impairments: Multiple swallows     Morgan Holland I. Vear Clock, MS, CCC-SLP Acute Rehabilitation Services Office number [phone] Pager [phone]  Scheryl Marten 03/21/2020,9:46 AM

## 2020-03-21 NOTE — Consult Note (Addendum)
Consultation Note Date: 03/21/2020   Patient Name: Morgan Holland  DOB: 1927-07-02  MRN: [id number]  Age / Sex: 86 y.o., female  PCP: Concepcion Elk, MD Referring Physician: Antonieta Pert, MD  Reason for Consultation: Establishing goals of care  HPI/Patient Profile: 86 y.o. female  with past medical history of systolic CHF EF 52-84%, severe aortic stenosis, s/p AVR 2014, chronic kidney disease, chronic anemia, hypothyroidism, hypertension, COVID infection December 2020 admitted on 03/19/2020 with low blood pressure and seizure-like episode as welll as acute CHF exacerbation and acute on chronic kidney CKD 3b. Recent admission for new CHF diagnosis. Now with severe prosthetic aortic valve stenosis as well as symptomatic anemia. MRI with small acute to subacute bilateral infarcts likely embolic secondary to aortic stenosis vs significant hypokinesis.   Clinical Assessment and Goals of Care: I met today with Morgan Holland today. She is a very sweet lady who is very hard of hearing. She explains how she has had health problems since she had COVID in December. Her niece has lived with her for ~1 year. She tells me that she is forgetful. When asked about what the doctors have told her she responds that she knows she has problems with her heart and asks if she is going to die. She understands that she is very ill and admits she is sad and scared with the severity of her issues and thinking about death. I explained that she is doing well today and that we will do everything we can to take good care of her no matter what the future holds. We did acknowledge that she may be approaching end of life. We discussed code status and she agrees that quality of life is more important to her and agrees this is not something she would want to go through. However, she would want other interventions to prolong and uphold her quality of life. She tells me  that she trusts her providers to help her know what is best for her.   I called granddaughter, Levada Dy, with Morgan Holland permission. I explained the above conversation. Levada Dy has good understanding and they are awaiting to see if Morgan Holland is a candidate for TAVR. Morgan Holland is open to this intervention but trusts her medical team to know if this is a good option for her. Levada Dy says that Morgan Holland desired full code when she discussed with her yesterday and I explained that she told me differently. Levada Dy is supportive of DNR but wants to speak with Morgan Holland again to be sure. They will call to confirm wish for DNR. Levada Dy knows that without these options she is interested in discussing further palliative resources at home. Levada Dy is certain that she wants Morgan Holland to return to her home where she lives with her.   All questions/concerns addressed. Emotional support provided.   Primary Decision Maker PATIENT    SUMMARY OF RECOMMENDATIONS   - DNR in discussion and recommended - Awaiting recommendation if TAVR is option (I feel this could be risky for Morgan Holland  who feels well today and told me today she wants everything done that we feel is a good idea but values quality of life)  Code Status/Advance Care Planning:  Full code - family discussing   Symptom Management:   She feels well today and without complaint.   Will need more symptom management most likely as notes report chronic nausea since COVID.   Palliative Prophylaxis:   Aspiration, Bowel Regimen, Delirium Protocol, Frequent Pain Assessment, Oral Care and Turn Reposition  Psycho-social/Spiritual:   Desire for further Chaplaincy support:yes  Additional Recommendations: Caregiving  Support/Resources and Education on Hospice  Prognosis:   Poor prognosis with CHF and severe aortic stenosis with underlying declining renal failure.   Discharge Planning: To Be Determined      Primary Diagnoses: Present on Admission: . Acute  on chronic systolic CHF (congestive heart failure) ([place]) . Iron deficiency anemia . Hypothyroidism . Acute renal failure superimposed on stage 3 chronic kidney disease ([place]) . Elevated troponin . Pressure injury of skin   I have reviewed the medical record, interviewed the patient and family, and examined the patient. The following aspects are pertinent.  Past Medical History:  Diagnosis Date  . Aortic stenosis, severe   . Hypertension   . MI (myocardial infarction) ([place])   . S/P AVR   . Thyroid disease    Social History   Socioeconomic History  . Marital status: Widowed    Spouse name: Not on file  . Number of children: Not on file  . Years of education: Not on file  . Highest education level: Not on file  Occupational History  . Not on file  Tobacco Use  . Smoking status: Never Smoker  . Smokeless tobacco: Never Used  Substance and Sexual Activity  . Alcohol use: Not Currently  . Drug use: Never  . Sexual activity: Not on file  Other Topics Concern  . Not on file  Social History Narrative  . Not on file   Social Determinants of Health   Financial Resource Strain:   . Difficulty of Paying Living Expenses: Not on file  Food Insecurity:   . Worried About Charity fundraiser in the Last Year: Not on file  . Ran Out of Food in the Last Year: Not on file  Transportation Needs:   . Lack of Transportation (Medical): Not on file  . Lack of Transportation (Non-Medical): Not on file  Physical Activity:   . Days of Exercise per Week: Not on file  . Minutes of Exercise per Session: Not on file  Stress:   . Feeling of Stress : Not on file  Social Connections:   . Frequency of Communication with Friends and Family: Not on file  . Frequency of Social Gatherings with Friends and Family: Not on file  . Attends Religious Services: Not on file  . Active Member of Clubs or Organizations: Not on file  . Attends Archivist Meetings: Not on file  . Marital Status: Not  on file   Family History  Problem Relation Age of Onset  . Cancer Mother   . Heart attack Father    Scheduled Meds: . aspirin EC  81 mg Oral Daily  . atorvastatin  20 mg Oral Daily  . heparin  5,000 Units Subcutaneous Q8H  . levothyroxine  100 mcg Oral Q0600  . pantoprazole  40 mg Oral Daily  . sodium chloride flush  3 mL Intravenous Q12H   Continuous Infusions: . sodium chloride  PRN Meds:.sodium chloride, acetaminophen, ondansetron (ZOFRAN) IV, sodium chloride flush No Known Allergies Review of Systems  Constitutional: Positive for activity change and appetite change.  Respiratory: Negative for shortness of breath.   Gastrointestinal: Negative for nausea and vomiting.    Physical Exam Vitals and nursing note reviewed.  Constitutional:      General: She is not in acute distress.    Appearance: She is ill-appearing.     Comments: Frail, elderly  Cardiovascular:     Rate and Rhythm: Normal rate.  Pulmonary:     Effort: Pulmonary effort is normal. No tachypnea, accessory muscle usage or respiratory distress.  Abdominal:     General: Abdomen is flat.     Palpations: Abdomen is soft.  Neurological:     Mental Status: She is alert and oriented to person, place, and time.     Comments: Hard of hearing     Vital Signs: BP 92/71 (BP Location: Right Arm)   Pulse 89   Temp 98.5 F (36.9 C) (Oral)   Resp 20   Ht _0  (1.575 m)   Wt 55.5 kg   SpO2 99%   BMI 22.39 kg/m  Pain Scale: 0-10   Pain Score: 0-No pain   SpO2: SpO2: 99 % O2 Device:SpO2: 99 % O2 Flow Rate: .   IO: Intake/output summary:   Intake/Output Summary (Last 24 hours) at 03/21/2020 1000 Last data filed at 03/20/2020 2247 Gross per 24 hour  Intake 353.34 ml  Output --  Net 353.34 ml    LBM: Last BM Date: 03/20/20 Baseline Weight: Weight: 56.1 kg Most recent weight: Weight: 55.5 kg     Palliative Assessment/Data:     Time In: 1600 Time Out: 1650 Time Total: 50 min Greater than 50%   of this time was spent counseling and coordinating care related to the above assessment and plan.  Signed by: Vinie Sill, NP Palliative Medicine Team Pager # [phone] (M-F 8a-5p) Team Phone # [phone] (Nights/Weekends)

## 2020-03-21 NOTE — Consult Note (Addendum)
[facility]  HISTORY AND PHYSICAL  Morgan Holland is an 86 y.o. female.    Chief Complaint: low BP and seizure  HPI: Pt is a 79F with a PMH sig for HTN, severe AS, and newly diagnosed CHF who is now seen in consultation at the request of Dr Jonathon Bellows for eval and recs re: AKI on CKD.  Pt was recently d/c'd from [facility] for newly diagnosed CHF with EF 40-45%.  She was placed on Lasix and metoprolol.  Apparently BP has been in the 80s at home since discharge.  She was brought back to the hospital 03/19/20 after she was noted to be shaking all over as she sat up to eat lunch.  Evaluation noted that her BP was int he 90s/ 70s in the ED.  Ct head was negative, but bilateral infarct were noted on MRI.    Neuro was consulted, strokes may have been embolic in nature d/t her severe AS and newly dx'd heart failure.  Likely had seizure as well.  Cr was 1.5 9/4-->1.8 9/7--> 1.8 9/8--> 2.38 9/9, prompting our consult. In this setting we are asked to see.  No ACEi/ ARB.  No other nephrotoxic medications.  Urine output has been adequate.  Potassium is very slightly high at 5.2.   Pt does not report any pain. Very hard of hearing.  No f/c, n/v, SOB, CP, LE edema.    PMH: Past Medical History:  Diagnosis Date  . Aortic stenosis, severe   . Hypertension   . MI (myocardial infarction) (HCC)   . S/P AVR   . Thyroid disease    PSH: Past Surgical History:  Procedure Laterality Date  . AORTIC VALVE REPLACEMENT      Past Medical History:  Diagnosis Date  . Aortic stenosis, severe   . Hypertension   . MI (myocardial infarction) (HCC)   . S/P AVR   . Thyroid disease     Medications:   Scheduled: . aspirin EC  81 mg Oral Daily  . atorvastatin  20 mg Oral Daily  . heparin  5,000 Units Subcutaneous Q8H  . levothyroxine  100 mcg Oral Q0600  . pantoprazole  40 mg Oral Daily  . sodium chloride flush  3 mL Intravenous Q12H    Medications Prior to Admission  Medication Sig Dispense Refill  .  acetaminophen (TYLENOL) 325 MG tablet Take 650 mg by mouth every 6 (six) hours as needed for headache (pain).    Marland Kitchen aspirin EC 81 MG tablet Take 81 mg by mouth daily.     . ferrous sulfate 325 (65 FE) MG EC tablet Take 1 tablet by mouth 2 (two) times daily.    . folic acid (FOLVITE) 1 MG tablet Take 1 tablet (1 mg total) by mouth daily. 30 tablet 0  . furosemide (LASIX) 40 MG tablet Take 1 tablet (40 mg total) by mouth daily. 30 tablet 11  . levothyroxine (SYNTHROID) 100 MCG tablet Take 100 mcg by mouth every morning.    . meclizine (ANTIVERT) 12.5 MG tablet Take 1 tablet (12.5 mg total) by mouth 3 (three) times daily as needed for dizziness. 30 tablet 0  . metoprolol tartrate (LOPRESSOR) 25 MG tablet Take 0.5 tablets (12.5 mg total) by mouth 2 (two) times daily. 60 tablet 0  . Multiple Vitamin (MULTIVITAMIN) capsule Take 1 capsule by mouth daily.    Marland Kitchen omeprazole (PRILOSEC) 40 MG capsule Take 40 mg by mouth 2 (two) times daily.     . ondansetron (ZOFRAN-ODT)  8 MG disintegrating tablet Take 8 mg by mouth every 12 (twelve) hours as needed for nausea.    . polyethylene glycol (MIRALAX / GLYCOLAX) 17 g packet Take 17 g by mouth daily. 14 each 0  . potassium chloride (KLOR-CON) 10 MEQ tablet Take 2 tablets (20 mEq total) by mouth daily. 30 tablet 0  . triamcinolone cream (KENALOG) 0.1 % Apply 1 application topically See admin instructions. Apply topically to chin daily as needed for itching/rash    . vitamin B-12 1000 MCG tablet Take 1 tablet (1,000 mcg total) by mouth daily. 30 tablet 0    ALLERGIES:  No Known Allergies  FAM HX: Family History  Problem Relation Age of Onset  . Cancer Mother   . Heart attack Father     Social History:   reports that she has never smoked. She has never used smokeless tobacco. She reports previous alcohol use. She reports that she does not use drugs.  ROS: ROS: all other systems reviewed and are negative except as per HPI  Blood pressure 92/71, pulse 89,  temperature 98.5 F (36.9 C), temperature source Oral, resp. rate 20, height  (1.575 m), weight 55.5 kg, SpO2 99 %. PHYSICAL EXAM: Physical Exam  GEN NAD, sitting in bed HEENT EOMI PERRL PULM normal WOB, faint bibasilar crackles CV RRR III/VI systolic murmur noted ABD soft, nontender EXT trace LE edema NEURO AAO x 3 nonfocal SKIN no rashes or lesions   Results for orders placed or performed during the hospital encounter of 03/19/20 (from the past 48 hour(s))  Brain natriuretic peptide     Status: Abnormal   Collection Time: 03/19/20  4:43 PM  Result Value Ref Range   B Natriuretic Peptide 4,371.6 (H) 0.0 - 100.0 pg/mL    Comment: Performed at [facility], [address] [place], [place] [address]  CBC with Differential/Platelet     Status: Abnormal   Collection Time: 03/19/20  4:44 PM  Result Value Ref Range   WBC 8.8 4.0 - 10.5 K/uL   RBC 3.18 (L) 3.87 - 5.11 MIL/uL   Hemoglobin 9.4 (L) 12.0 - 15.0 g/dL   HCT 60.4 (L) 36 - 46 %   MCV 95.6 80.0 - 100.0 fL   MCH 29.6 26.0 - 34.0 pg   MCHC 30.9 30.0 - 36.0 g/dL   RDW 54.0 (H) 98.1 - 19.1 %   Platelets 255 150 - 400 K/uL   nRBC 0.0 0.0 - 0.2 %   Neutrophils Relative % 68 %   Neutro Abs 6.0 1.7 - 7.7 K/uL   Lymphocytes Relative 20 %   Lymphs Abs 1.7 0.7 - 4.0 K/uL   Monocytes Relative 10 %   Monocytes Absolute 0.9 0 - 1 K/uL   Eosinophils Relative 1 %   Eosinophils Absolute 0.1 0 - 0 K/uL   Basophils Relative 0 %   Basophils Absolute 0.0 0 - 0 K/uL   Immature Granulocytes 1 %   Abs Immature Granulocytes 0.11 (H) 0.00 - 0.07 K/uL    Comment: Performed at [facility], [address] [place], [place] [address]  Comprehensive metabolic panel     Status: Abnormal   Collection Time: 03/19/20  4:44 PM  Result Value Ref Range   Sodium 134 (L) 135 - 145 mmol/L   Potassium 4.7 3.5 - 5.1 mmol/L   Chloride 94 (L) 98 - 111 mmol/L   CO2 24 22 - 32 mmol/L   Glucose, Bld 117 (  H) 70 - 99 mg/dL     Comment: Glucose reference range applies only to samples taken after fasting for at least 8 hours.   BUN 47 (H) 8 - 23 mg/dL   Creatinine, Ser 1.28 (H) 0.44 - 1.00 mg/dL   Calcium 8.8 (L) 8.9 - 10.3 mg/dL   Total Protein 6.7 6.5 - 8.1 g/dL   Albumin 3.1 (L) 3.5 - 5.0 g/dL   AST 22 15 - 41 U/L   ALT 8 0 - 44 U/L   Alkaline Phosphatase 56 38 - 126 U/L   Total Bilirubin 1.2 0.3 - 1.2 mg/dL   GFR calc non Af Amer 24 (L) >60 mL/min   GFR calc Af Amer 28 (L) >60 mL/min   Anion gap 16 (H) 5 - 15    Comment: Performed at [facility], [address] [place], [place] [address]  Magnesium     Status: None   Collection Time: 03/19/20  4:44 PM  Result Value Ref Range   Magnesium 1.7 1.7 - 2.4 mg/dL    Comment: Performed at [facility], [address] [place], [place] [address]  SARS Coronavirus 2 by RT PCR (hospital order, performed in [facility] hospital lab) Nasopharyngeal Nasopharyngeal Swab     Status: None   Collection Time: 03/19/20  7:52 PM   Specimen: Nasopharyngeal Swab  Result Value Ref Range   SARS Coronavirus 2 NEGATIVE NEGATIVE    Comment: (NOTE) SARS-CoV-2 target nucleic acids are NOT DETECTED.  The SARS-CoV-2 RNA is generally detectable in upper and lower respiratory specimens during the acute phase of infection. The lowest concentration of SARS-CoV-2 viral copies this assay can detect is 250 copies / mL. A negative result does not preclude SARS-CoV-2 infection and should not be used as the sole basis for treatment or other patient management decisions.  A negative result may occur with improper specimen collection / handling, submission of specimen other than nasopharyngeal swab, presence of viral mutation(s) within the areas targeted by this assay, and inadequate number of viral copies (<250 copies / mL). A negative result must be combined with clinical observations, patient history, and epidemiological information.  Fact Sheet for Patients:    BoilerBrush.com.cy  Fact Sheet for Healthcare Providers: https://pope.com/  This test is not yet approved or  cleared by the [place] FDA and has been authorized for detection and/or diagnosis of SARS-CoV-2 by FDA under an Emergency Use Authorization (EUA).  This EUA will remain in effect (meaning this test can be used) for the duration of the COVID-19 declaration under Section 564(b)(1) of the Act, 21 U.S.C. section 360bbb-3(b)(1), unless the authorization is terminated or revoked sooner.  Performed at [facility], [address] [place], [place] [address]   Troponin I (High Sensitivity)     Status: Abnormal   Collection Time: 03/19/20  8:32 PM  Result Value Ref Range   Troponin I (High Sensitivity) 796 (HH) <18 ng/L    Comment: CRITICAL RESULT CALLED TO, READ BACK BY AND VERIFIED WITH: LISA ADKINS RN AT 2140 ON 03/19/20 BY I.SUGUT (NOTE) Elevated high sensitivity troponin I (hsTnI) values and significant  changes across serial measurements may suggest ACS but many other  chronic and acute conditions are known to elevate hsTnI results.  Refer to the Links section for chest pain algorithms and additional  guidance. Performed at [facility], [address], [place], [place] [address]   Basic metabolic panel  Status: Abnormal   Collection Time: 03/20/20 12:35 AM  Result Value Ref Range   Sodium 139 135 - 145 mmol/L   Potassium 4.4 3.5 - 5.1 mmol/L   Chloride 102 98 - 111 mmol/L   CO2 25 22 - 32 mmol/L   Glucose, Bld 124 (H) 70 - 99 mg/dL    Comment: Glucose reference range applies only to samples taken after fasting for at least 8 hours.   BUN 42 (H) 8 - 23 mg/dL   Creatinine, Ser 1.611.86 (H) 0.44 - 1.00 mg/dL   Calcium 8.6 (L) 8.9 - 10.3 mg/dL   GFR calc non Af Amer 23 (L) >60 mL/min   GFR calc Af Amer 27 (L) >60 mL/min   Anion gap 12 5 - 15    Comment: Performed at [facility], [address] [place], [place] [address]  CBC     Status: Abnormal   Collection Time: 03/20/20 12:35 AM  Result Value Ref Range   WBC 9.5 4.0 - 10.5 K/uL   RBC 2.88 (L) 3.87 - 5.11 MIL/uL   Hemoglobin 8.3 (L) 12.0 - 15.0 g/dL   HCT 54.027.6 (L) 36 - 46 %   MCV 95.8 80.0 - 100.0 fL   MCH 28.8 26.0 - 34.0 pg   MCHC 30.1 30.0 - 36.0 g/dL   RDW 98.117.2 (H) 19.111.5 - 47.815.5 %   Platelets 244 150 - 400 K/uL   nRBC 0.0 0.0 - 0.2 %    Comment: Performed at [facility], [address] [place], [place] [address]  Troponin I (High Sensitivity)     Status: Abnormal   Collection Time: 03/20/20 12:35 AM  Result Value Ref Range   Troponin I (High Sensitivity) 769 (HH) <18 ng/L    Comment: CRITICAL RESULT CALLED TO, READ BACK BY AND VERIFIED WITH: G.RASUL,RN 0143 03/20/2020 M.CAMPBELL (NOTE) Elevated high sensitivity troponin I (hsTnI) values and significant  changes across serial measurements may suggest ACS but many other  chronic and acute conditions are known to elevate hsTnI results.  Refer to the Links section for chest pain algorithms and additional  guidance. Performed at [facility], [address] [place], [place] [address]   Basic metabolic panel     Status: Abnormal   Collection Time: 03/21/20  6:58 AM  Result Value Ref Range   Sodium 136 135 - 145 mmol/L   Potassium 5.2 (H) 3.5 - 5.1 mmol/L   Chloride 99 98 - 111 mmol/L   CO2 23 22 - 32 mmol/L   Glucose, Bld 130 (H) 70 - 99 mg/dL    Comment: Glucose reference range applies only to samples taken after fasting for at least 8 hours.   BUN 59 (H) 8 - 23 mg/dL   Creatinine, Ser 5.782.38 (H) 0.44 - 1.00 mg/dL   Calcium 8.6 (L) 8.9 - 10.3 mg/dL   GFR calc non Af Amer 17 (L) >60 mL/min   GFR calc Af Amer 20 (L) >60 mL/min   Anion gap 14 5 - 15    Comment: Performed at [facility], [address] [place], [place] [address]  CBC     Status: Abnormal   Collection Time: 03/21/20  6:58 AM  Result Value Ref Range   WBC 9.7 4.0 - 10.5  K/uL   RBC 2.93 (L) 3.87 - 5.11 MIL/uL   Hemoglobin 8.8 (L) 12.0 - 15.0 g/dL   HCT 95.228.4 (L) 36 - 46 %  MCV 96.9 80.0 - 100.0 fL   MCH 30.0 26.0 - 34.0 pg   MCHC 31.0 30.0 - 36.0 g/dL   RDW 16.1 (H) 09.6 - 04.5 %   Platelets 250 150 - 400 K/uL   nRBC 0.3 (H) 0.0 - 0.2 %    Comment: Performed at [facility], [address] [place], [place] [address]    CT Head Wo Contrast  Result Date: 03/19/2020 CLINICAL DATA:  86 year old female recently hospitalized with CHF. Seizure last night. EXAM: CT HEAD WITHOUT CONTRAST TECHNIQUE: Contiguous axial images were obtained from the base of the skull through the vertex without intravenous contrast. COMPARISON:  None. FINDINGS: Brain: No midline shift, mass effect, or evidence of intracranial mass lesion. No acute intracranial hemorrhage identified. No ventriculomegaly. Patchy and confluent widespread cerebral white matter hypodensity, including deep white matter capsule involvement which appears greater in the left hemisphere. Comparatively mild heterogeneity in the bilateral deep gray nuclei. Small chronic appearing infarct of the right superior cerebellum. Occasional small areas of cortical encephalomalacia, including in the left middle frontal gyrus on coronal image 23. No cortically based acute infarct identified. Vascular: Calcified atherosclerosis at the skull base. Skull: No acute osseous abnormality identified. Sinuses/Orbits: Mild bubbly opacity in the right sphenoid. Other Visualized paranasal sinuses and mastoids are clear. Other: No acute orbit or scalp soft tissue finding. IMPRESSION: Advanced chronic ischemic disease with no acute intracranial abnormality by CT. Electronically Signed   By: Odessa Fleming M.D.   On: 03/19/2020 17:00   MR ANGIO HEAD WO CONTRAST  Result Date: 03/20/2020 CLINICAL DATA:  Neuro deficit, acute, stroke suspected. EXAM: MRA HEAD WITHOUT CONTRAST TECHNIQUE: Angiographic images of the Circle of Willis were obtained using MRA  technique without intravenous contrast. COMPARISON:  Brain MRI 03/20/2020, head CT 03/19/2020 FINDINGS: The intracranial internal carotid arteries are patent. The M1 middle cerebral arteries are patent without significant stenosis. No M2 proximal branch occlusion or high-grade proximal stenosis is identified. The anterior cerebral arteries are patent. The visualized intracranial vertebral arteries are patent. The intracranial left vertebral artery is dominant. The basilar artery is patent. The posterior cerebral arteries are patent proximally without significant stenosis. Posterior communicating arteries are hypoplastic or absent bilaterally. No intracranial aneurysm is identified. IMPRESSION: No intracranial large vessel occlusion or proximal high-grade stenosis. Electronically Signed   By: Jackey Loge DO   On: 03/20/2020 15:40   MR ANGIO NECK WO CONTRAST  Result Date: 03/20/2020 CLINICAL DATA:  Neuro deficit, acute, stroke suspected. EXAM: MRA NECK WITHOUT CONTRAST TECHNIQUE: Angiographic images of the neck were obtained using MRA technique without intravenous contrast. Carotid stenosis measurements (when applicable) are obtained utilizing NASCET criteria, using the distal internal carotid diameter as the denominator. COMPARISON:  Noncontrast brain MRI 03/20/2020, concurrently performed MRA head 03/20/2020, head CT 03/19/2020. FINDINGS: Aberrant right subclavian artery. Motion degradation and non-contrast technique precludes adequate evaluation for stenosis within the proximal right subclavian artery. No hemodynamically significant stenosis of the proximal left subclavian artery. The bilateral common and internal carotid arteries are patent within the neck without evidence of hemodynamically significant stenosis (50% or greater). There is atherosclerotic plaque within both carotid bifurcations and proximal internal carotid arteries. Non-contrast technique and motion artifact precludes adequate evaluation of  the origins of the vertebral arteries. Within this limitation, the vertebral arteries are patent within the neck bilaterally with antegrade flow and without appreciable significant stenosis. The left vertebral artery is dominant. IMPRESSION: The bilateral common and internal carotid arteries are patent within the  neck without evidence of hemodynamically significant stenosis. Atherosclerotic plaque is present within both carotid bifurcations and proximal internal carotid arteries. Non-contrast technique and motion artifact precludes adequate evaluation of the origins of the vertebral arteries. Within this limitation, the cervical vertebral arteries are patent within the neck without appreciable significant stenosis. Aberrant right subclavian artery. Electronically Signed   By: Jackey Loge DO   On: 03/20/2020 15:51   MR BRAIN WO CONTRAST  Result Date: 03/20/2020 CLINICAL DATA:  Nontraumatic seizure. Recent hypertensive episode and admission for CHF. EXAM: MRI HEAD WITHOUT CONTRAST TECHNIQUE: Multiplanar, multiecho pulse sequences of the brain and surrounding structures were obtained without intravenous contrast. COMPARISON:  Head CT from yesterday FINDINGS: Brain: Punctate acute to subacute infarcts along the bilateral cerebral convexity and right caudate body, roughly along the cerebral watershed territories. A small more linear subacute appearing infarct is seen along the high left frontal cortex. Confluent chronic small vessel ischemic gliosis in the cerebral white matter. Small remote bilateral cerebellar infarcts. Remote cerebral hemispheric infarcts. Age normal brain volume. No acute hemorrhage, hydrocephalus, or masslike finding. There have been a few remote microhemorrhages with nonspecific pattern given the limited degree. Vascular: Normal flow voids Skull and upper cervical spine: Normal marrow signal Sinuses/Orbits: Bilateral cataract resection. Small proteinaceous nasopharyngeal cyst on the right.  IMPRESSION: 1. Small acute to subacute infarcts along the bilateral cerebral cortex, likely embolic. 2. Background of advanced chronic small vessel disease. Electronically Signed   By: Marnee Spring M.D.   On: 03/20/2020 04:16   DG Chest Port 1 View  Result Date: 03/19/2020 CLINICAL DATA:  Low blood pressure. EXAM: PORTABLE CHEST 1 VIEW COMPARISON:  A 08/12/2019 FINDINGS: The heart size remains enlarged. The patient is status post prior median sternotomy. There are hazy bilateral airspace opacities with prominent interstitial lung markings. Aortic calcifications are again noted. There is no pneumothorax. There is no acute osseous abnormality. There may be small bilateral pleural effusions. IMPRESSION: Cardiomegaly with findings of congestive heart failure. Electronically Signed   By: Katherine Mantle M.D.   On: 03/19/2020 17:00   EEG adult  Result Date: 03/20/2020 Charlsie Quest, MD     03/20/2020 11:02 AM Patient Name: Morgan Holland MRN: [id number] Epilepsy Attending: Charlsie Quest Referring Physician/Provider: Dr Odie Sera Date: 03/20/2020 Duration: 23.17 mins Patient history: 86 year old female with seizure-like activity.  EEG to evaluate for seizures. Level of alertness: Awake, asleep AEDs during EEG study: None Technical aspects: This EEG study was done with scalp electrodes positioned according to the 10-20 International system of electrode placement. Electrical activity was acquired at a sampling rate of 500Hz  and reviewed with a high frequency filter of 70Hz  and a low frequency filter of 1Hz . EEG data were recorded continuously and digitally stored. Description: The posterior dominant rhythm consists of 8-9 Hz activity of moderate voltage (25-35 uV) seen predominantly in posterior head regions, symmetric and reactive to eye opening and eye closing. Sleep was characterized by vertex waves, sleep spindles (12 to 14 Hz), maximal frontocentral region.    Hyperventilation and photic stimulation  were not performed.   IMPRESSION: This study is within normal limits. No seizures or epileptiform discharges were seen throughout the recording. Priyanka    Assessment/Plan 1.  AKI on CKD 3b: CKD 3b likely d/t HTN and natural aging.  AKI likely hypotension-mediated.  Adequate UOP.  Will order UA and renal US--> ? Emboli to kidneys.  Avoid nephrotoxic agents.  Agree with holding Lasix for now and monitoring closely.  Weights are staying stable.  She may not be able to tolerate any anti-hypertensives given that her BP is < 100 systolic here.    2.  Mild hyperkalemia: follow, lokelma 5 g once  3.  Chronic systolic CHF EF 40-45%: newly diagnosed, holding metoprolol and Lasix for now  4.  Bilateral cortical infarcts: neuro following  5.  Seizure- like activity- seizure precautions, neuro following  6.  Anemia: GI has been consulted, appreciate assistance  7.  Dispo: admitted  Bufford Buttner 03/21/2020, 10:40 AM

## 2020-03-22 ENCOUNTER — Encounter (HOSPITAL_COMMUNITY)
Admission: EM | Disposition: A | Discharge status: Hospice | Payer: Self-pay | Source: Home / Self Care | Attending: Internal Medicine

## 2020-03-22 DIAGNOSIS — N183 Chronic kidney disease, stage 3 unspecified: Secondary | ICD-10-CM

## 2020-03-22 DIAGNOSIS — R1310 Dysphagia, unspecified: Secondary | ICD-10-CM

## 2020-03-22 LAB — CBC
HCT: 30.7 % — ABNORMAL LOW (ref 36.0–46.0)
Hemoglobin: 9.4 g/dL — ABNORMAL LOW (ref 12.0–15.0)
MCH: 29.6 pg (ref 26.0–34.0)
MCHC: 30.6 g/dL (ref 30.0–36.0)
MCV: 96.5 fL (ref 80.0–100.0)
Platelets: 273 10*3/uL (ref 150–400)
RBC: 3.18 MIL/uL — ABNORMAL LOW (ref 3.87–5.11)
RDW: 18.5 % — ABNORMAL HIGH (ref 11.5–15.5)
WBC: 13 10*3/uL — ABNORMAL HIGH (ref 4.0–10.5)
nRBC: 0.4 % — ABNORMAL HIGH (ref 0.0–0.2)

## 2020-03-22 LAB — BASIC METABOLIC PANEL
Anion gap: 15 (ref 5–15)
BUN: 78 mg/dL — ABNORMAL HIGH (ref 8–23)
CO2: 24 mmol/L (ref 22–32)
Calcium: 8.8 mg/dL — ABNORMAL LOW (ref 8.9–10.3)
Chloride: 99 mmol/L (ref 98–111)
Creatinine, Ser: 2.95 mg/dL — ABNORMAL HIGH (ref 0.44–1.00)
GFR calc Af Amer: 15 mL/min — ABNORMAL LOW (ref >60)
GFR calc non Af Amer: 13 mL/min — ABNORMAL LOW (ref >60)
Glucose, Bld: 129 mg/dL — ABNORMAL HIGH (ref 70–99)
Potassium: 4.9 mmol/L (ref 3.5–5.1)
Sodium: 138 mmol/L (ref 135–145)

## 2020-03-22 LAB — TSH: TSH: 0.241 u[IU]/mL — ABNORMAL LOW (ref 0.350–4.500)

## 2020-03-22 LAB — UREA NITROGEN, URINE: Urea Nitrogen, Ur: 615 mg/dL

## 2020-03-22 SURGERY — ESOPHAGOGASTRODUODENOSCOPY (EGD) WITH PROPOFOL
Anesthesia: Monitor Anesthesia Care

## 2020-03-22 MED ORDER — ALBUMIN HUMAN 5 % IV SOLN
12.5000 g | Freq: Once | INTRAVENOUS | Status: AC
  Administered 2020-03-22: 12.5 g via INTRAVENOUS
  Filled 2020-03-22: qty 250

## 2020-03-22 MED ORDER — AMIODARONE HCL IN DEXTROSE 360-4.14 MG/200ML-% IV SOLN
30.0000 mg/h | INTRAVENOUS | Status: DC
  Administered 2020-03-22 – 2020-03-23 (×2): 30 mg/h via INTRAVENOUS
  Filled 2020-03-22 (×2): qty 200

## 2020-03-22 MED ORDER — AMIODARONE HCL IN DEXTROSE 360-4.14 MG/200ML-% IV SOLN
60.0000 mg/h | INTRAVENOUS | Status: DC
  Administered 2020-03-22 (×2): 60 mg/h via INTRAVENOUS
  Filled 2020-03-22: qty 200

## 2020-03-22 MED ORDER — SODIUM CHLORIDE 0.9 % IV BOLUS
250.0000 mL | Freq: Once | INTRAVENOUS | Status: AC
  Administered 2020-03-22: 250 mL via INTRAVENOUS

## 2020-03-22 MED ORDER — AMIODARONE LOAD VIA INFUSION
150.0000 mg | Freq: Once | INTRAVENOUS | Status: AC
  Administered 2020-03-22: 150 mg via INTRAVENOUS
  Filled 2020-03-22: qty 83.34

## 2020-03-22 MED ORDER — METOPROLOL TARTRATE 12.5 MG HALF TABLET
12.5000 mg | ORAL_TABLET | Freq: Two times a day (BID) | ORAL | Status: DC
  Filled 2020-03-22 (×2): qty 1

## 2020-03-22 MED ORDER — METOPROLOL TARTRATE 12.5 MG HALF TABLET: 12.5000 mg | ORAL_TABLET | Freq: Two times a day (BID) | ORAL | Status: DC

## 2020-03-22 NOTE — Progress Notes (Signed)
Physical Therapy Treatment Patient Details Name: Morgan Holland MRN: [id number] DOB: Aug 25, 1926 Today's Date: 03/22/2020    History of Present Illness Pt is a 86 y.o. female with medical history significant for recent diagnosis of systolic CHF with EF 40 to 45%, severe aortic stenosis, chronic kidney disease, chronic anemia, hypothyroidism, and hypertension, who presented to the ED with low blood pressures and seizure-like episode. MRI brain  9/9: Small acute to subacute infarcts along the bilateral cerebral cortex, likely embolic. CXR 9/7 consistent with CHF.     PT Comments    Today's skilled session focused on LE strengthening due to pt with medical issues limiting mobility at this time. Pt with nausea (has been medicated by RN), low resting BP and increases resting HR ranging from 116-136 with session. Pt remains pleasant and cooperative with PT. Acute PT to continue during pt's hospital stay.    Follow Up Recommendations  Home health PT;Supervision for mobility/OOB     Equipment Recommendations  Rolling walker with 5" wheels       Precautions / Restrictions Precautions Precautions: Fall    Cognition Arousal/Alertness: Awake/alert Behavior During Therapy: WFL for tasks assessed/performed Overall Cognitive Status: History of cognitive impairments - at baseline               General Comments: pleasantly confused.       Exercises General Exercises - Lower Extremity Ankle Circles/Pumps: AROM;Strengthening;Both;10 reps;Supine Quad Sets: AROM;Strengthening;Both;10 reps;Supine Quad Sets Limitations: 5 sec holds Heel Slides: AAROM;Strengthening;Both;10 reps;Supine Hip ABduction/ADduction: AAROM;Strengthening;Both;10 reps;Supine Straight Leg Raises: AAROM;Strengthening;Both;10 reps;Supine     Pertinent Vitals/Pain Pain Assessment: No/denies pain     PT Goals (current goals can now be found in the care plan section) Acute Rehab PT Goals Patient Stated Goal: feel better.  home. PT Goal Formulation: With patient Time For Goal Achievement: 04/04/20 Potential to Achieve Goals: Good Progress towards PT goals: Progressing toward goals (limited due to medical status today)    Frequency    Min 3X/week      PT Plan Current plan remains appropriate    AM-PAC PT "6 Clicks" Mobility   Outcome Measure  Help needed turning from your back to your side while in a flat bed without using bedrails?: A Little Help needed moving from lying on your back to sitting on the side of a flat bed without using bedrails?: A Little Help needed moving to and from a bed to a chair (including a wheelchair)?: A Little Help needed standing up from a chair using your arms (e.g., wheelchair or bedside chair)?: A Little Help needed to walk in hospital room?: A Little Help needed climbing 3-5 steps with a railing? : A Lot 6 Click Score: 17    End of Session   Activity Tolerance: Patient tolerated treatment well;Patient limited by fatigue;Treatment limited secondary to medical complications (Comment) (nausea, soft BP, elevated resting HR) Patient left: in bed;with call bell/phone within reach;with bed alarm set Nurse Communication: Mobility status PT Visit Diagnosis: Muscle weakness (generalized) (M62.81)     Time: 4098-1191 PT Time Calculation (min) (ACUTE ONLY): 16 min  Charges:  $Therapeutic Exercise: 8-22 mins                     Sallyanne Kuster, PTA, [facility] Acute Rehab Services Office- [phone] 03/22/20, 9:39 AM  Sallyanne Kuster 03/22/2020, 9:35 AM

## 2020-03-22 NOTE — Progress Notes (Signed)
Patient called for zofran for nausea. Says she also felt anxious this morning. HR was in the 130s, reached 155 at one point. Patient converted to a.fib w/RVR from NSR. EKG done and MD notified.

## 2020-03-22 NOTE — Progress Notes (Signed)
Pharmacist Heart Failure Core Measure Documentation  Assessment: Morgan Holland has an EF documented as 40-45% on 9/1 by TTE.  Rationale: Heart failure patients with left ventricular systolic dysfunction (LVSD) and an EF < 40% should be prescribed an angiotensin converting enzyme inhibitor (ACEI) or angiotensin receptor blocker (ARB) at discharge unless a contraindication is documented in the medical record.  This patient is not currently on an ACEI or ARB for HF.  This note is being placed in the record in order to provide documentation that a contraindication to the use of these agents is present for this encounter.  ACE Inhibitor or Angiotensin Receptor Blocker is contraindicated (specify all that apply)  []   ACEI allergy AND ARB allergy []   Angioedema [x]   Moderate or severe aortic stenosis []   Hyperkalemia []   Hypotension []   Renal artery stenosis [x]   Worsening renal function, preexisting renal disease or dysfunction  , PharmD, BCPS, BCCP Clinical Pharmacist  Please check AMION for all [facility] phone numbers After 10:00 PM, call Main Pharmacy [phone]

## 2020-03-22 NOTE — Progress Notes (Signed)
Daily Progress Note   Patient Name: Morgan Holland       Date: 03/22/2020 DOB: Mar 02, 1927  Age: 86 y.o. MRN#: [id number] Attending Physician: Lanae Boast, MD Primary Care Physician: Ardyth Gal, MD Admit Date: 03/19/2020  Reason for Consultation/Follow-up: Establishing goals of care  Subjective: Patient awake, alert, oriented and able to participate in discussion. Denies pain, discomfort, or dyspnea.  GOC: Patient shares that she has spoken with many doctors today and has been told that she might not make it. She had a good understanding of how sick she is but is saddened to hear this. We discussed diagnoses, interventions, plan of care.   Granddaughter, [name] called while at bedside and patient requests I provide her an update.   Spoke further with patient about her wishes regarding life support and resuscitation. Patient is very clear that she does not desire intubation and would not wish to live as a "vegetable." Discussed her thoughts on CPR and medical recommendation against this, with fear it will cause pain and suffering at the end of her life with multiple co-morbidities, current clinical status, and age/frailty. Described code blue scenario. After further discussion, patient is leaning against CPR understanding it is unlikely to provide benefit or if she survived, meaningful quality of life. She would like to further discuss this when [name] is at bedside tomorrow morning. She does speak of God having the final say.   Patient asks me if she will die tomorrow. Certainly explained that I do not have a crystal ball or act as God, but that the doctors are very worried about her condition and prognosis. Emotional/spiritual support provided. Patient is appreciative of visit and gives me  permission to call granddaughter, [name]. She understands plan to meet with [name] at bedside in AM.    **Spoke with [name] via telephone to provide update on diagnoses, interventions, plan of care. [name] plans to be at [facility] tomorrow 9/11 by 7am in hopes of catching multiple providers to receive update on her grandmother's condition. She does seem to understand diagnoses, tenuous clinical condition, and guarded/poor prognosis. [name] does share her grandmother's decision for DNI also sharing she would not wish to live as a vegetable. She also shares her understanding that CPR would not be effective or lead to meaningful quality of life. We plan to further discuss DNR  code status in AM and [name] is agreeable with DNR code status if her grandmother is.   [name] does state that her grandmother "does not want to pass away alone in the hospital" and that she feels her grandmother would rather return home and focus on palliative/comfort pathway if there are no other options. Also that her grandmother has a lot of fear and anxiety surrounding death, so would be important to manage her anxiety if goals shift. Reassured [name] that we will discuss goals with her grandmother in AM. Answered all questions.    Length of Stay: 3  Current Medications: Scheduled Meds:  . aspirin EC  81 mg Oral Daily  . atorvastatin  20 mg Oral Daily  . levothyroxine  100 mcg Oral Q0600  . metoprolol tartrate  12.5 mg Oral BID  . pantoprazole  40 mg Oral Daily  . sodium chloride flush  3 mL Intravenous Q12H    Continuous Infusions: . sodium chloride    . amiodarone 60 mg/hr (03/22/20 0834)   Followed by  . amiodarone      PRN Meds: sodium chloride, acetaminophen, ondansetron (ZOFRAN) IV, sodium chloride flush  Physical Exam Vitals and nursing note reviewed.  Constitutional:      General: She is awake.     Appearance: She is ill-appearing.  HENT:     Head: Normocephalic and atraumatic.  Cardiovascular:     Rate  and Rhythm: Rhythm irregularly irregular.  Pulmonary:     Effort: No tachypnea, accessory muscle usage or respiratory distress.  Abdominal:     Tenderness: There is no abdominal tenderness.  Skin:    General: Skin is warm and dry.  Neurological:     Mental Status: She is alert and oriented to person, place, and time.            Vital Signs: BP (!) 85/63 (BP Location: Right Arm)   Pulse (!) 51   Temp 98.7 F (37.1 C) (Oral)   Resp 20   Ht 5\' 2"  (1.575 m)   Wt 55.7 kg Comment: scale a  SpO2 96%   BMI 22.48 kg/m  SpO2: SpO2: 96 % O2 Device: O2 Device: Room Air O2 Flow Rate:    Intake/output summary:   Intake/Output Summary (Last 24 hours) at 03/22/2020 1147 Last data filed at 03/21/2020 2100 Gross per 24 hour  Intake 240 ml  Output --  Net 240 ml   LBM: Last BM Date: 03/20/20 Baseline Weight: Weight: 56.1 kg Most recent weight: Weight: 55.7 kg (scale a)       Palliative Assessment/Data: PPS 40%      Patient Active Problem List   Diagnosis Date Noted  . Goals of care, counseling/discussion   . DNR (do not resuscitate) discussion   . Palliative care by specialist   . Cerebral thrombosis with cerebral infarction 03/20/2020  . Hypotension   . Acute on chronic systolic CHF (congestive heart failure) (HCC) 03/19/2020  . Acute renal failure superimposed on stage 3 chronic kidney disease (HCC) 03/19/2020  . Elevated troponin 03/19/2020  . Observed seizure-like activity (HCC) 03/19/2020  . 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 Iron deficiency anemia 03/16/2020  . Mitral regurgitation and aortic stenosis 03/16/2020  . Acute combined systolic and diastolic heart failure (HCC)   . Aortic valve replaced   . Severe aortic stenosis   . Chronic blood loss anemia 03/13/2020  . Chronic GI bleeding 03/13/2020  . Pressure injury of skin 06/13/2019  . Hypothyroidism 06/12/2019  . Hypertension   .  Bradycardia     Palliative Care Assessment & Plan   Patient Profile: 86  y.o. female  with past medical history of systolic CHF EF 40-45%, severe aortic stenosis, s/p AVR 2014, chronic kidney disease, chronic anemia, hypothyroidism, hypertension, COVID infection December 2020 admitted on 03/19/2020 with low blood pressure and seizure-like episode as welll as acute CHF exacerbation and acute on chronic kidney CKD 3b. Recent admission for new CHF diagnosis. Now with severe prosthetic aortic valve stenosis as well as symptomatic anemia. MRI with small acute to subacute bilateral infarcts likely embolic secondary to aortic stenosis vs significant hypokinesis.   Assessment: Acute on chronic systolic CHF EF 40-45% AKI on CKD stage IIIb Severe prosthetic aortic value stenosis Symptomatic anemia Small bilateral acute to subacute infarcts Dysphagia Hypotension IDA  Recommendations/Plan:  Limited code status now--NO intubation per patient wishes. Will further discuss code status and recommendation against CPR with granddaughter and patient in AM. Seem to be leaning against resuscitation, understanding medical recommendation for DNR.   Continue current plan of care and medical management.  Granddaughter, [name] will be at [facility] early AM 9/11. PMT provider will meet with her and patient at bedside for further GOC discussion.   Code Status: Partial--no intubation   Code Status Orders  (From admission, onward)         Start     Ordered   03/19/20 2322  Full code  Continuous        03/19/20 2323        Code Status History    Date Active Date Inactive Code Status Order ID Comments User Context   03/13/2020 0208 03/16/2020 2027 Full Code [id number]  Hillary Bow, DO Inpatient   06/12/2019 1805 06/16/2019 1534 Full Code [id number]  Ollen Bowl, MD ED   Advance Care Planning Activity       Prognosis:   Poor prognosis  Discharge Planning:  To Be Determined  Care plan was discussed with patient, granddaughter, RN, updated multidisciplinary team  Thank you  for allowing the Palliative Medicine Team to assist in the care of this patient.   Total Time 60 Prolonged Time Billed no      Greater than 50%  of this time was spent counseling and coordinating care related to the above assessment and plan.  Vennie Homans, DNP, FNP-C Palliative Medicine Team  Phone: [phone] Fax: [phone]  Please contact Palliative Medicine Team phone at [phone] for questions and concerns.

## 2020-03-22 NOTE — Plan of Care (Signed)
  Problem: Clinical Measurements: Goal: Ability to maintain clinical measurements within normal limits will improve Outcome: Progressing   Problem: Clinical Measurements: Goal: Cardiovascular complication will be avoided Outcome: Progressing   

## 2020-03-22 NOTE — Progress Notes (Signed)
   03/22/20 2043  Assess: MEWS Score  BP (!) 80/64  Resp 18  SpO2 96 %  O2 Device Room Air  Assess: MEWS Score  MEWS Temp 0  MEWS Systolic 2  MEWS Pulse 0  MEWS RR 0  MEWS LOC 0  MEWS Score 2  MEWS Score Color Yellow  Assess: if the MEWS score is Yellow or Red  Were vital signs taken at a resting state? Yes  Focused Assessment No change from prior assessment  Early Detection of Sepsis Score *See Row Information* Low  MEWS guidelines implemented *See Row Information* No, previously red, continue vital signs every 4 hours

## 2020-03-22 NOTE — Progress Notes (Signed)
Pt wasn't able to urinate today. Bladder scan volume at 17:00 was 132 ml. Notified Dr. Signe Colt and Dayna Barker.

## 2020-03-22 NOTE — Progress Notes (Signed)
   03/22/20 0500  Assess: MEWS Score  ECG Heart Rate (!) 130  Assess: MEWS Score  MEWS Temp 0  MEWS Systolic 1  MEWS Pulse 3  MEWS RR 0  MEWS LOC 0  MEWS Score 4  MEWS Score Color Red  Assess: if the MEWS score is Yellow or Red  Were vital signs taken at a resting state? Yes  Focused Assessment Change from prior assessment (see assessment flowsheet)  Early Detection of Sepsis Score *See Row Information* Low  MEWS guidelines implemented *See Row Information* Yes  Treat  MEWS Interventions Administered scheduled meds/treatments;Administered prn meds/treatments;Escalated (See documentation below)  Pain Scale 0-10  Pain Score 0  Take Vital Signs  Increase Vital Sign Frequency  Red: Q 1hr X 4 then Q 4hr X 4, if remains red, continue Q 4hrs  Escalate  MEWS: Escalate Red: discuss with charge nurse/RN and provider, consider discussing with RRT  Notify: Provider  Provider Name/Title Dr.Martin  Date Provider Notified 03/22/20  Time Provider Notified [phone]  Notification Type Page  Notification Reason Change in status  Response See new orders  Date of Provider Response 03/22/20  Time of Provider Response 0531  Document  Progress note created (see row info) Yes

## 2020-03-22 NOTE — Progress Notes (Signed)
PROGRESS NOTE    Morgan Holland  ZJI:[id number] DOB: 04-24-1927 DOA: 03/19/2020 PCP: Ardyth Gal, MD   Chief Complaint  Patient presents with  . Hypotension   Brief Narrative:As per HPI:" 86 y.o. female with medical history significant for recent diagnosis of systolic CHF with EF 40 to 45%, severe aortic stenosis, hx of covid, chronic kidney disease, chronic anemia, hypothyroidism, and hypertension, now presenting to the emergency department with low blood pressures and seizure-like episode.  Patient was discharged from the hospital on 03/16/2020 after admission for newly diagnosed CHF.  Since returning home, her systolic blood pressure has been in the 80s much of the time and her granddaughter whom she lives with has been holding her metoprolol.  Patient complains of intermittent nausea without vomiting and states that she has been experiencing this ever since she was hospitalized with Covid last December.  She denies any chest pain or palpitations, has not noticed any leg swelling, and does not feel particularly short of breath.  Per report of her granddaughter, the patient was preparing to eat lunch on 03/18/2020 when she began shaking all over and would not respond to questioning.  This lasted 15 to 20 seconds and was followed by a period of somnolence.  Granddaughter has a child with epilepsy and states that this looked just like generalized seizures she has seen before along with a postictal period.  Patient reports that she was experiencing nausea prior to the episode, but again states that she has had this frequently for the past several months.She has never had any seizure-like episodes previously per family report.  [facility] ED Course:Upon arrival to the ED, patient is found to be afebrile, saturating low 90s on room air, and with blood pressure 90/70.  EKG features sinus rhythm with T wave inversions.  Head CT is notable for advanced chronic ischemic disease but no acute intracranial abnormality.   Chest x-ray with cardiomegaly, prominent interstitial markings, and possible small bilateral pleural effusions.  Chemistry panel reveals a creatinine 1.87, up from 1.51 two days earlier.CBC notable for stable normocytic anemia.  Troponin is elevated to 796 and BNP is elevated 4372.Covid PCR is negative.  Cardiology was consulted and was admitted" Per daughter PT would not come on Monday when she had seizure like episode and BP was low in 80/50 and feeling bad at home so granddaughter brought to hospital on tuesday.  Subjective: BP hypotensive and has been in A fib-cardio managing overnight This am again hypotensive s/p 250 ml bolus and cardio started on albumin for low bp and amiodarone gtt Patient reports she feels fine feels and feels better than yesterday.Has had nausea. Some chest pressors and HR in 140s at times.  Assessment & Plan:  Acute on chronic systolic CHF, along with likely diastolic dysfunction:lvef 40-45%, bnp was 4371 on admission.  Cardiology on board and managing, slightly volume overloaded, but needing hydration for symptomatic hypotension.  Monitor volume status closely. Wt Readings from Last 3 Encounters:  03/22/20 55.7 kg  03/16/20 56.1 kg  06/12/19 62.5 kg   New onset A. fib with RVR now on amiodarone, having issue with hypotension so placed on 250 cc of fibrin element as per cardiology.  Tolerating thus far.  Continue plan as per cardiology  AKI on CKD IIIb: likely from her hypotension at home, and other meds-ace/arb/diuretics. Creat 1,8>1.8> 2.38> 2.9,was 1.3-1.5 on last d/c on 9.4.21.lasix on hold.  Supported with fluid and albumin for hypotension per cardiology.  Nephrology on board, discussed this morning-she may  be at a point where none of this is fixable, agree with palliative care evaluation.  Renal ultrasound unremarkable UA with some WBC few bacteria.  Recent Labs  Lab 03/16/20 0822 03/19/20 1644 03/20/20 0035 03/21/20 0658 03/22/20 0525  BUN 24* 47* 42*  59* 78*  CREATININE 1.51* 1.82* 1.86* 2.38* 2.95*   Small bilateral acute to subacute infarcts: She had  observed seizure-like activityat home- for which underwent  MRI showed "Small acute to subacute infarcts along the bilateral cerebral cortex, likely embolic. 2. Background of advanced chronic small vessel disease" appreciate neurology input on board wondering if the stroke was related to hypotension/hypoperfusion or a fib- has a fib 9/10 am. Continue aspirin 81, lipitor.MRA neck and MRA head no acute finding.  Her ldl is 75.  Hemoglobin A1c 4.8. Pt.ot.slp eval.  Hyperkalemia:2/2 AKI,resolved.    Dysphagia with globus sensation at level of ZOX:WRUEAV on board,  barium swallow shows narrowing of the distal esophagus and GE junction.  GI on board hopefully once stable can go through EGD.  Hypotension/Hx of WUJ:WJXBJ pressure has been soft hypotensive also with A. fib with RVR cardiology managing.  Holding antihypertensive.  Caution with IV fluids given risk of CHF worsening.  Very difficult situation, palliative care has been consulted  Elevated troponin:796>769.No chest pain.Cardio has been consulted likely demand mismatch.Continue aspirin 81.  Hypothyroidism:Cont  home Synthroid.  Severe aortic stenosis of bioprosthetic aortic valve:followed by cardiology.  Valve placed in 2014, mean gradient 15 emergency.At risk of flask Pulm Edema.  Seen by structural heart team but anemia has complicated her work-up thus far and do not suspect at this point she would tolerate.  Hello yes  Iron deficiency anemia: Hemoglobin improved to 9.5 without transfusion.  GI on board, not a candidate for colonoscopy but there is plan for endoscopy but on hold due to unstable heart rate and blood pressure keep on PPI.  Monitor H&H.  Now on full liquid diet as per GI.  Status post 1 unit PRBC previously.   she is on chronic iron supplementation recently added folic acid and B12 on last admission a week ago required 1 unit  PRBC and Feraheme as well.  History of gastritis diverticulosis and small polyp. Recent Labs  Lab 03/16/20 0827 03/19/20 1644 03/20/20 0035 03/21/20 0658 03/22/20 0525  HGB 9.7* 9.4* 8.3* 8.8* 9.4*  HCT 32.0* 30.4* 27.6* 28.4* 30.7*   GOC: Very difficult situation.  Patient is  having hypotension, aki, A. fib with RVR also with new stroke, has complex comorbidities, advanced age.  She is at high risk of decompensation.  Palliative care is following closely.    DVT prophylaxis:  Code Status:  Code Status: Full Code spoke w granddaughter 03/20/20. Family Communication: plan of care discussed with patient at bedside.  Discussed with the nursing staff. Granddaughter was updated and is aware she is not doing well, at risk fo decompensation and plans be here tomorrow am for palliative care meeting  Status is: Inpatient  Remains inpatient appropriate becauseLOngoing diagnostic testing needed not appropriate for outpatient work up, IV treatments appropriate due to intensity of illness or inability to take PO and Inpatient level of care appropriate due to severity of illness  Dispo: The patient is from: Home              Anticipated d/c is to: tbd              Anticipated d/c date is TBD  Patient currently is not medically stable to d/c.  Remains hospitalized due to ongoing management of renal failure, aortic stenosis, anemia.  Nutrition: Diet Order            Diet full liquid Room service appropriate? Yes; Fluid consistency: Thin  Diet effective now                  Body mass index is 22.48 kg/m. Pressure Ulcer: Pressure Injury 03/19/20 Sacrum Medial Stage 2 -  Partial thickness loss of dermis presenting as a shallow open injury with a red, pink wound bed without slough. (Active)  03/19/20 2315  Location: Sacrum  Location Orientation: Medial  Staging: Stage 2 -  Partial thickness loss of dermis presenting as a shallow open injury with a red, pink wound bed without slough.    Wound Description (Comments):   Present on Admission: Yes    Consultants:see note  Procedures:see note Microbiology:see note Blood Culture    Component Value Date/Time   SDES  03/12/2020 2354    URINE, RANDOM Performed at [facility], [address] [place], [place] [address]    [facility]  03/12/2020 2354    NONE Performed at [facility], [address] [place], [place] [address]    CULT MULTIPLE SPECIES PRESENT, SUGGEST RECOLLECTION (A) 03/12/2020 2354   REPTSTATUS 03/13/2020 FINAL 03/12/2020 2354    Other culture-see note  Medications: Scheduled Meds: . aspirin EC  81 mg Oral Daily  . atorvastatin  20 mg Oral Daily  . levothyroxine  100 mcg Oral Q0600  . metoprolol tartrate  12.5 mg Oral BID  . pantoprazole  40 mg Oral Daily  . sodium chloride flush  3 mL Intravenous Q12H   Continuous Infusions: . sodium chloride    . amiodarone 60 mg/hr (03/22/20 0834)   Followed by  . amiodarone      Antimicrobials: Anti-infectives (From admission, onward)   None     Objective: Vitals: Today's Vitals   03/22/20 0900 03/22/20 0932 03/22/20 1100 03/22/20 1115  BP: (!) 83/54 (!) 86/58 (!) 87/69 (!) 85/63  Pulse: (!) 105 (!) 123 (!) 118 (!) 51  Resp:      Temp:    98.7 F (37.1 C)  TempSrc:    Oral  SpO2:  99%  96%  Weight:      Height:      PainSc:        Intake/Output Summary (Last 24 hours) at 03/22/2020 1213 Last data filed at 03/21/2020 2100 Gross per 24 hour  Intake 240 ml  Output --  Net 240 ml   Filed Weights   03/20/20 0526 03/21/20 0007 03/22/20 0338  Weight: 55.4 kg 55.5 kg 55.7 kg   Weight change: 0.227 kg  Intake/Output from previous day: 09/09 0701 - 09/10 0700 In: 240 [P.O.:240] Out: -  Intake/Output this shift: No intake/output data recorded.  Examination: General exam: AAO , elderly, frail, NAD, weak appearing. HEENT:Oral mucosa moist, Ear/Nose WNL grossly, dentition normal. Respiratory system: bilaterally  diminished breath sound, mild basal crackles,no use of accessory muscle Cardiovascular system: S1 & S2 +,+ JVD, anterior aortic area murmur present. Gastrointestinal system: Abdomen soft, NT,ND, BS+ Nervous System:Alert, awake, moving extremities and grossly nonfocal Extremities: No edema, distal peripheral pulses palpable.  Skin: No rashes,no icterus. MSK: Normal muscle bulk,tone, power   Data Reviewed: I have personally reviewed following labs and imaging studies CBC: Recent Labs  Lab 03/16/20 0827 03/19/20 1644 03/20/20 0035 03/21/20  3244 03/22/20 0525  WBC 10.3 8.8 9.5 9.7 13.0*  NEUTROABS  --  6.0  --   --   --   HGB 9.7* 9.4* 8.3* 8.8* 9.4*  HCT 32.0* 30.4* 27.6* 28.4* 30.7*  MCV 94.7 95.6 95.8 96.9 96.5  PLT 326 255 244 250 273   Basic Metabolic Panel: Recent Labs  Lab 03/16/20 0822 03/19/20 1644 03/20/20 0035 03/21/20 0658 03/22/20 0525  NA 134* 134* 139 136 138  K 3.9 4.7 4.4 5.2* 4.9  CL 99 94* 102 99 99  CO2 GLUCOSE 130* 117* 124* 130* 129*  BUN 24* 47* 42* 59* 78*  CREATININE 1.51* 1.82* 1.86* 2.38* 2.95*  CALCIUM 8.7* 8.8* 8.6* 8.6* 8.8*  MG  --  1.7  --   --   --    GFR: Estimated Creatinine Clearance: 9.8 mL/min (A) (by C-G formula based on SCr of 2.95 mg/dL (H)). Liver Function Tests: Recent Labs  Lab 03/19/20 1644  AST 22  ALT 8  ALKPHOS 56  BILITOT 1.2  PROT 6.7  ALBUMIN 3.1*   No results for input(s): LIPASE, AMYLASE in the last 168 hours. No results for input(s): AMMONIA in the last 168 hours. Coagulation Profile: No results for input(s): INR, PROTIME in the last 168 hours. Cardiac Enzymes: No results for input(s): CKTOTAL, CKMB, CKMBINDEX, TROPONINI in the last 168 hours. BNP (last 3 results) No results for input(s): PROBNP in the last 8760 hours. HbA1C: No results for input(s): HGBA1C in the last 72 hours. CBG: Recent Labs  Lab 03/21/20 1150  GLUCAP 116*   Lipid Profile: No results for input(s): CHOL, HDL,  LDLCALC, TRIG, CHOLHDL, LDLDIRECT in the last 72 hours. Thyroid Function Tests: Recent Labs    03/22/20 0525  TSH 0.241*   Anemia Panel: No results for input(s): VITAMINB12, FOLATE, FERRITIN, TIBC, IRON, RETICCTPCT in the last 72 hours. Sepsis Labs: No results for input(s): PROCALCITON, LATICACIDVEN in the last 168 hours.  Recent Results (from the past 240 hour(s))  Culture, blood (routine x 2)     Status: None   Collection Time: 03/12/20  5:50 PM   Specimen: BLOOD  Result Value Ref Range Status   Specimen Description   Final    BLOOD RIGHT ANTECUBITAL Performed at [facility], [address] [place], [place] [address]    Special Requests   Final    BOTTLES DRAWN AEROBIC AND ANAEROBIC Blood Culture adequate volume Performed at [facility], [address] [place], [place] [address]    Culture   Final    NO GROWTH 5 DAYS Performed at [facility], [address], [place], [place] [address]    Report Status 03/17/2020 FINAL  Final  SARS Coronavirus 2 by RT PCR (hospital order, performed in [facility] hospital lab) Nasopharyngeal Peripheral     Status: None   Collection Time: 03/12/20  5:52 PM   Specimen: Peripheral; Nasopharyngeal  Result Value Ref Range Status   SARS Coronavirus 2 NEGATIVE NEGATIVE Final    Comment: (NOTE) SARS-CoV-2 target nucleic acids are NOT DETECTED.  The SARS-CoV-2 RNA is generally detectable in upper and lower respiratory specimens during the acute phase of infection. The lowest concentration of SARS-CoV-2 viral copies this assay can detect is 250 copies / mL. A negative result does not preclude SARS-CoV-2 infection and should not be used as the sole basis for treatment or other patient management decisions.  A negative result  may occur with improper specimen collection / handling, submission of specimen other than nasopharyngeal swab, presence of viral mutation(s) within the areas targeted by this assay, and  inadequate number of viral copies (<250 copies / mL). A negative result must be combined with clinical observations, patient history, and epidemiological information.  Fact Sheet for Patients:   BoilerBrush.com.cy  Fact Sheet for Healthcare Providers: https://pope.com/  This test is not yet approved or  cleared by the [place] FDA and has been authorized for detection and/or diagnosis of SARS-CoV-2 by FDA under an Emergency Use Authorization (EUA).  This EUA will remain in effect (meaning this test can be used) for the duration of the COVID-19 declaration under Section 564(b)(1) of the Act, 21 U.S.C. section 360bbb-3(b)(1), unless the authorization is terminated or revoked sooner.  Performed at [facility], [address] [place], [place] [address]   Culture, blood (routine x 2)     Status: None   Collection Time: 03/12/20  6:07 PM   Specimen: BLOOD  Result Value Ref Range Status   Specimen Description   Final    BLOOD LEFT ANTECUBITAL Performed at [facility], [address] [place], [place] [address]    Special Requests   Final    BOTTLES DRAWN AEROBIC AND ANAEROBIC Blood Culture adequate volume Performed at [facility], [address] [place], [place] [address]    Culture   Final    NO GROWTH 5 DAYS Performed at [facility], [address] [place], [place] [address]    Report Status 03/17/2020 FINAL  Final  Urine culture     Status: Abnormal   Collection Time: 03/12/20 11:54 PM   Specimen: Urine, Random  Result Value Ref Range Status   Specimen Description   Final    URINE, RANDOM Performed at [facility], [address] [place], [place] [address]    Special Requests   Final    NONE Performed at [facility], [address] [place], [place] [address]    Culture MULTIPLE SPECIES PRESENT, SUGGEST RECOLLECTION (A)  Final   Report Status  03/13/2020 FINAL  Final  SARS Coronavirus 2 by RT PCR (hospital order, performed in [facility] hospital lab) Nasopharyngeal Nasopharyngeal Swab     Status: None   Collection Time: 03/19/20  7:52 PM   Specimen: Nasopharyngeal Swab  Result Value Ref Range Status   SARS Coronavirus 2 NEGATIVE NEGATIVE Final    Comment: (NOTE) SARS-CoV-2 target nucleic acids are NOT DETECTED.  The SARS-CoV-2 RNA is generally detectable in upper and lower respiratory specimens during the acute phase of infection. The lowest concentration of SARS-CoV-2 viral copies this assay can detect is 250 copies / mL. A negative result does not preclude SARS-CoV-2 infection and should not be used as the sole basis for treatment or other patient management decisions.  A negative result may occur with improper specimen collection / handling, submission of specimen other than nasopharyngeal swab, presence of viral mutation(s) within the areas targeted by this assay, and inadequate number of viral copies (<250 copies / mL). A negative result must be combined with clinical observations, patient history, and epidemiological information.  Fact Sheet for Patients:   BoilerBrush.com.cy  Fact Sheet for Healthcare Providers: https://pope.com/  This test is not yet approved or  cleared by the [place] FDA and has been authorized for detection and/or diagnosis of SARS-CoV-2 by FDA under an  Emergency Use Authorization (EUA).  This EUA will remain in effect (meaning this test can be used) for the duration of the COVID-19 declaration under Section 564(b)(1) of the Act, 21 U.S.C. section 360bbb-3(b)(1), unless the authorization is terminated or revoked sooner.  Performed at [facility], [address], [place], [place] [address]      Radiology Studies: MR ANGIO HEAD WO CONTRAST  Result Date: 03/20/2020 CLINICAL DATA:  Neuro deficit, acute, stroke suspected.  EXAM: MRA HEAD WITHOUT CONTRAST TECHNIQUE: Angiographic images of the Circle of Willis were obtained using MRA technique without intravenous contrast. COMPARISON:  Brain MRI 03/20/2020, head CT 03/19/2020 FINDINGS: The intracranial internal carotid arteries are patent. The M1 middle cerebral arteries are patent without significant stenosis. No M2 proximal branch occlusion or high-grade proximal stenosis is identified. The anterior cerebral arteries are patent. The visualized intracranial vertebral arteries are patent. The intracranial left vertebral artery is dominant. The basilar artery is patent. The posterior cerebral arteries are patent proximally without significant stenosis. Posterior communicating arteries are hypoplastic or absent bilaterally. No intracranial aneurysm is identified. IMPRESSION: No intracranial large vessel occlusion or proximal high-grade stenosis. Electronically Signed   By: Jackey Loge DO   On: 03/20/2020 15:40   MR ANGIO NECK WO CONTRAST  Result Date: 03/20/2020 CLINICAL DATA:  Neuro deficit, acute, stroke suspected. EXAM: MRA NECK WITHOUT CONTRAST TECHNIQUE: Angiographic images of the neck were obtained using MRA technique without intravenous contrast. Carotid stenosis measurements (when applicable) are obtained utilizing NASCET criteria, using the distal internal carotid diameter as the denominator. COMPARISON:  Noncontrast brain MRI 03/20/2020, concurrently performed MRA head 03/20/2020, head CT 03/19/2020. FINDINGS: Aberrant right subclavian artery. Motion degradation and non-contrast technique precludes adequate evaluation for stenosis within the proximal right subclavian artery. No hemodynamically significant stenosis of the proximal left subclavian artery. The bilateral common and internal carotid arteries are patent within the neck without evidence of hemodynamically significant stenosis (50% or greater). There is atherosclerotic plaque within both carotid bifurcations and  proximal internal carotid arteries. Non-contrast technique and motion artifact precludes adequate evaluation of the origins of the vertebral arteries. Within this limitation, the vertebral arteries are patent within the neck bilaterally with antegrade flow and without appreciable significant stenosis. The left vertebral artery is dominant. IMPRESSION: The bilateral common and internal carotid arteries are patent within the neck without evidence of hemodynamically significant stenosis. Atherosclerotic plaque is present within both carotid bifurcations and proximal internal carotid arteries. Non-contrast technique and motion artifact precludes adequate evaluation of the origins of the vertebral arteries. Within this limitation, the cervical vertebral arteries are patent within the neck without appreciable significant stenosis. Aberrant right subclavian artery. Electronically Signed   By: Jackey Loge DO   On: 03/20/2020 15:51   US RENAL  Result Date: 03/21/2020 CLINICAL DATA:  Acute kidney injury EXAM: RENAL / URINARY TRACT ULTRASOUND COMPLETE COMPARISON:  MRI 07/08/2016 FINDINGS: Right Kidney: Renal measurements: 8.9 x 4.8 x 4.8 cm = volume: 105 mL. Echogenicity within normal limits. 8 mm simple cyst at the midpole. No solid mass, shadowing stone, or hydronephrosis visualized. Left Kidney: Renal measurements: 10.1 x 5.0 x 4.0 cm = volume: 106 mL. Echogenicity within normal limits. 4 mm echogenic stone within the midpole of the left kidney. 2.1 cm simple cyst at the upper pole. No solid mass or hydronephrosis visualized. Bladder: Appears normal for degree of bladder distention. Other: None. IMPRESSION: 1. No evidence of obstructive uropathy. 2. Nonobstructing 4 mm left renal stone. 3. Simple bilateral  renal cysts. Electronically Signed   By: Duanne GuessNicholas  Plundo D.O.   On: 03/21/2020 13:05   DG ESOPHAGUS W SINGLE CM (SOL OR THIN BA)  Result Date: 03/21/2020 CLINICAL DATA:  Dysphagia, globus sensation EXAM:  ESOPHOGRAM/BARIUM SWALLOW TECHNIQUE: Single contrast examination was performed using thin barium or water soluble. FLUOROSCOPY TIME:  Fluoroscopy Time:  36 seconds Radiation Exposure Index (if provided by the fluoroscopic device): 2.9 mGy COMPARISON:  None. FINDINGS: Patient swallowed barium without difficulty. There is narrowing of the distal esophagus at the gastroesophageal junction. This is likely responsible for holdup of contrast seen throughout the study. Motility is normal. Small hiatal hernia. No definite gastroesophageal reflux. IMPRESSION: Narrowing of the distal esophagus at the gastroesophageal junction likely responsible for holdup of contrast seen throughout the study. Retained contrast in the esophagus corresponded to symptoms. Electronically Signed   By: Guadlupe SpanishPraneil  Patel M.D.   On: 03/21/2020 14:48     LOS: 3 days   Lanae Boastamesh Mario Coronado, MD [facility]  03/22/2020, 12:13 PM

## 2020-03-22 NOTE — Progress Notes (Signed)
Notified that pt has converted to AF w/ RVR w/ HR in 120s. She had been on metoprolol 12.5mg  bid which appears to have been held for hypotension. Will restart the metoprolol at 12.5mg  bid, first dose now; we can up-titrate that if tolerated for rate control. Pt has acute/subacute embolic brain infarcts noted on MRI that were questionably from cardioembolic source; this is likely due to PAF. Anti-coagulation indicated, however pt currently also has symptomatic anemia and is undergoing workup for that. She did require transfusion of 1unit PRBC during this hospitalization. Will hold off on adding heparin for the moment and defer to discussions with primary-following cardiology team, primary medical team, and palliative care (also involved) as to whether short or long-term anti-coagulation safe/indicated here.  Precious Reel, MD , [facility] 03/22/20 5:32 AM

## 2020-03-22 NOTE — Progress Notes (Signed)
Progress Note  Chief Complaint:    dsyphagia     ASSESSMENT / PLAN:     # IDA and dysphagia.  --Barium swallow shows narrowing of distal esophagus at GE junction. --Not candidate for colonoscopy ( for evaluation of IDA) but plan was for EGD today to evaluate both IDA and dysphagia. However, given new onset Afib with RVR plan is to postpone procedure until more stable from cardiac standpoint. BP is soft, still tachycardic.  --Hgb improved ( without transfusion) from 8.8 to 9.4 --Will order full liquid diet.   # New onset Afib with RVR --on Amiodarone drip  # Acute on chronic systolic heart failure  # AKI on CKD      SUBJECTIVE:   No complaints. Says she is mainly just drinking fluids, cannot swallow solids. No overt GI bleeding.     OBJECTIVE:    Scheduled inpatient medications:  . aspirin EC  81 mg Oral Daily  . atorvastatin  20 mg Oral Daily  . levothyroxine  100 mcg Oral Q0600  . metoprolol tartrate  12.5 mg Oral BID  . pantoprazole  40 mg Oral Daily  . sodium chloride flush  3 mL Intravenous Q12H   Continuous inpatient infusions:  . sodium chloride    . amiodarone 60 mg/hr (03/22/20 0834)   Followed by  . amiodarone     PRN inpatient medications: sodium chloride, acetaminophen, ondansetron (ZOFRAN) IV, sodium chloride flush  Vital signs in last 24 hours: Temp:  [97.5 F (36.4 C)-97.6 F (36.4 C)] 97.6 F (36.4 C) (09/10 0711) Pulse Rate:  [81-146] 123 (09/10 0932) Resp:  [18-20] 20 (09/10 0711) BP: (72-96)/(49-72) 86/58 (09/10 0932) SpO2:  [94 %-100 %] 99 % (09/10 0932) Weight:  [55.7 kg] 55.7 kg (09/10 0338) Last BM Date: 03/14/20  Intake/Output Summary (Last 24 hours) at 03/22/2020 1015 Last data filed at 03/21/2020 2100 Gross per 24 hour  Intake 240 ml  Output --  Net 240 ml     Physical Exam:  . General: Alert female in NAD . Heart:  Irreg rate and rhythm.  . Pulmonary: Normal respiratory effort . Abdomen: Soft, nondistended,  Nontender. Normal bowel sounds. No masses felt. . Neurologic: Alert and oriented . Psych: Pleasant. Cooperative.   Filed Weights   03/20/20 0526 03/21/20 0007 03/22/20 0338  Weight: 55.4 kg 55.5 kg 55.7 kg    Intake/Output from previous day: 09/09 0701 - 09/10 0700 In: 240 [P.O.:240] Out: -  Intake/Output this shift: No intake/output data recorded.    Lab Results: Recent Labs    03/20/20 0035 03/21/20 0658 03/22/20 0525  WBC 9.5 9.7 13.0*  HGB 8.3* 8.8* 9.4*  HCT 27.6* 28.4* 30.7*  PLT 244 250 273   BMET Recent Labs    03/20/20 0035 03/21/20 0658 03/22/20 0525  NA 139 136 138  K 4.4 5.2* 4.9  CL 102 99 99  CO2 25 23 24   GLUCOSE 124* 130* 129*  BUN 42* 59* 78*  CREATININE 1.86* 2.38* 2.95*  CALCIUM 8.6* 8.6* 8.8*   LFT Recent Labs    03/19/20 1644  PROT 6.7  ALBUMIN 3.1*  AST 22  ALT 8  ALKPHOS 56  BILITOT 1.2   PT/INR No results for input(s): LABPROT, INR in the last 72 hours. Hepatitis Panel No results for input(s): HEPBSAG, HCVAB, HEPAIGM, HEPBIGM in the last 72 hours.  MR ANGIO HEAD WO CONTRAST  Result Date: 03/20/2020 CLINICAL DATA:  Neuro deficit, acute, stroke suspected. EXAM: MRA HEAD  WITHOUT CONTRAST TECHNIQUE: Angiographic images of the Circle of Willis were obtained using MRA technique without intravenous contrast. COMPARISON:  Brain MRI 03/20/2020, head CT 03/19/2020 FINDINGS: The intracranial internal carotid arteries are patent. The M1 middle cerebral arteries are patent without significant stenosis. No M2 proximal branch occlusion or high-grade proximal stenosis is identified. The anterior cerebral arteries are patent. The visualized intracranial vertebral arteries are patent. The intracranial left vertebral artery is dominant. The basilar artery is patent. The posterior cerebral arteries are patent proximally without significant stenosis. Posterior communicating arteries are hypoplastic or absent bilaterally. No intracranial aneurysm is  identified. IMPRESSION: No intracranial large vessel occlusion or proximal high-grade stenosis. Electronically Signed   By: Jackey Loge DO   On: 03/20/2020 15:40   MR ANGIO NECK WO CONTRAST  Result Date: 03/20/2020 CLINICAL DATA:  Neuro deficit, acute, stroke suspected. EXAM: MRA NECK WITHOUT CONTRAST TECHNIQUE: Angiographic images of the neck were obtained using MRA technique without intravenous contrast. Carotid stenosis measurements (when applicable) are obtained utilizing NASCET criteria, using the distal internal carotid diameter as the denominator. COMPARISON:  Noncontrast brain MRI 03/20/2020, concurrently performed MRA head 03/20/2020, head CT 03/19/2020. FINDINGS: Aberrant right subclavian artery. Motion degradation and non-contrast technique precludes adequate evaluation for stenosis within the proximal right subclavian artery. No hemodynamically significant stenosis of the proximal left subclavian artery. The bilateral common and internal carotid arteries are patent within the neck without evidence of hemodynamically significant stenosis (50% or greater). There is atherosclerotic plaque within both carotid bifurcations and proximal internal carotid arteries. Non-contrast technique and motion artifact precludes adequate evaluation of the origins of the vertebral arteries. Within this limitation, the vertebral arteries are patent within the neck bilaterally with antegrade flow and without appreciable significant stenosis. The left vertebral artery is dominant. IMPRESSION: The bilateral common and internal carotid arteries are patent within the neck without evidence of hemodynamically significant stenosis. Atherosclerotic plaque is present within both carotid bifurcations and proximal internal carotid arteries. Non-contrast technique and motion artifact precludes adequate evaluation of the origins of the vertebral arteries. Within this limitation, the cervical vertebral arteries are patent within the  neck without appreciable significant stenosis. Aberrant right subclavian artery. Electronically Signed   By: Jackey Loge DO   On: 03/20/2020 15:51   US RENAL  Result Date: 03/21/2020 CLINICAL DATA:  Acute kidney injury EXAM: RENAL / URINARY TRACT ULTRASOUND COMPLETE COMPARISON:  MRI 07/08/2016 FINDINGS: Right Kidney: Renal measurements: 8.9 x 4.8 x 4.8 cm = volume: 105 mL. Echogenicity within normal limits. 8 mm simple cyst at the midpole. No solid mass, shadowing stone, or hydronephrosis visualized. Left Kidney: Renal measurements: 10.1 x 5.0 x 4.0 cm = volume: 106 mL. Echogenicity within normal limits. 4 mm echogenic stone within the midpole of the left kidney. 2.1 cm simple cyst at the upper pole. No solid mass or hydronephrosis visualized. Bladder: Appears normal for degree of bladder distention. Other: None. IMPRESSION: 1. No evidence of obstructive uropathy. 2. Nonobstructing 4 mm left renal stone. 3. Simple bilateral renal cysts. Electronically Signed   By: Duanne Guess D.O.   On: 03/21/2020 13:05   EEG adult  Result Date: 03/20/2020 Charlsie Quest, MD     03/20/2020 11:02 AM Patient Name: Morgan Holland MRN: [id number] Epilepsy Attending: Charlsie Quest Referring Physician/Provider: Dr Odie Sera Date: 03/20/2020 Duration: 23.17 mins Patient history: 85 year old female with seizure-like activity.  EEG to evaluate for seizures. Level of alertness: Awake, asleep AEDs during EEG study: None Technical aspects: This EEG study  was done with scalp electrodes positioned according to the 10-20 International system of electrode placement. Electrical activity was acquired at a sampling rate of 500Hz  and reviewed with a high frequency filter of 70Hz  and a low frequency filter of 1Hz . EEG data were recorded continuously and digitally stored. Description: The posterior dominant rhythm consists of 8-9 Hz activity of moderate voltage (25-35 uV) seen predominantly in posterior head regions, symmetric and  reactive to eye opening and eye closing. Sleep was characterized by vertex waves, sleep spindles (12 to 14 Hz), maximal frontocentral region.    Hyperventilation and photic stimulation were not performed.   IMPRESSION: This study is within normal limits. No seizures or epileptiform discharges were seen throughout the recording. Priyanka   DG ESOPHAGUS W SINGLE CM (SOL OR THIN BA)  Result Date: 03/21/2020 CLINICAL DATA:  Dysphagia, globus sensation EXAM: ESOPHOGRAM/BARIUM SWALLOW TECHNIQUE: Single contrast examination was performed using thin barium or water soluble. FLUOROSCOPY TIME:  Fluoroscopy Time:  36 seconds Radiation Exposure Index (if provided by the fluoroscopic device): 2.9 mGy COMPARISON:  None. FINDINGS: Patient swallowed barium without difficulty. There is narrowing of the distal esophagus at the gastroesophageal junction. This is likely responsible for holdup of contrast seen throughout the study. Motility is normal. Small hiatal hernia. No definite gastroesophageal reflux. IMPRESSION: Narrowing of the distal esophagus at the gastroesophageal junction likely responsible for holdup of contrast seen throughout the study. Retained contrast in the esophagus corresponded to symptoms. Electronically Signed   By: M.D.   On: 03/21/2020 14:48      Principal Problem:   Acute on chronic systolic CHF (congestive heart failure) (HCC) Active Problems:   Hypothyroidism   Pressure injury of skin   Severe aortic stenosis   Iron deficiency anemia   Acute renal failure superimposed on stage 3 chronic kidney disease (HCC)   Elevated troponin   Observed seizure-like activity (HCC)   Hypotension   Cerebral thrombosis with cerebral infarction   Goals of care, counseling/discussion   DNR (do not resuscitate) discussion   Palliative care by specialist     LOS: 3 days   05/21/2020 ,NP 03/22/2020, 10:15 AM

## 2020-03-22 NOTE — Progress Notes (Addendum)
Progress Note  Patient Name: Morgan Holland Date of Encounter: 03/22/2020  [facility] Cardiologist: Chilton Si, MD   Subjective   Pt complains of chest pressure, dizziness, and nausea. RVR with rates in the 130-140s.  Inpatient Medications    Scheduled Meds: . amiodarone  150 mg Intravenous Once  . aspirin EC  81 mg Oral Daily  . atorvastatin  20 mg Oral Daily  . levothyroxine  100 mcg Oral Q0600  . metoprolol tartrate  12.5 mg Oral BID  . pantoprazole  40 mg Oral Daily  . sodium chloride flush  3 mL Intravenous Q12H   Continuous Infusions: . sodium chloride    . albumin human    . amiodarone     Followed by  . amiodarone     PRN Meds: sodium chloride, acetaminophen, ondansetron (ZOFRAN) IV, sodium chloride flush   Vital Signs    Vitals:   03/22/20 0338 03/22/20 0601 03/22/20 0625 03/22/20 0711  BP: 96/68 (!) 72/49 (!) 82/52 (!) 82/72  Pulse: 81 (!) 138    Resp: 18 20  20   Temp: 97.6 F (36.4 C) (!) 97.5 F (36.4 C)  97.6 F (36.4 C)  TempSrc: Oral Oral  Oral  SpO2: 97% 94%  98%  Weight: 55.7 kg     Height:        Intake/Output Summary (Last 24 hours) at 03/22/2020 0754 Last data filed at 03/21/2020 2100 Gross per 24 hour  Intake 240 ml  Output --  Net 240 ml   Last 3 Weights 03/22/2020 03/21/2020 03/20/2020  Weight (lbs) 122 lb 14.4 oz 122 lb 6.4 oz 122 lb 3.2 oz  Weight (kg) 55.747 kg 55.52 kg 55.43 kg      Telemetry    Converted to Afib RVR in the 140s at 0425 - Personally Reviewed  ECG    Atrial fibrillation with ventricular rate 136 septal Q waves, ST depression lateral leads - Personally Reviewed  Physical Exam   GEN: No acute distress.   Neck: + JVD Cardiac: irregular rhythm tachycardic rate, + 4/6 murmur Respiratory: scattered crackles in bases GI: Soft, nontender, non-distended  MS: 1+ B LE edema Neuro:  Nonfocal  Psych: Normal affect   Labs    High Sensitivity Troponin:   Recent Labs  Lab 03/12/20 1749 03/13/20 0508  03/13/20 0719 03/19/20 2032 03/20/20 0035  TROPONINIHS 176* 169* 182* 796* 769*      Chemistry Recent Labs  Lab 03/19/20 1644 03/19/20 1644 03/20/20 0035 03/21/20 0658 03/22/20 0525  NA 134*   < > 139 136 138  K 4.7   < > 4.4 5.2* 4.9  CL 94*   < > 102 99 99  CO2 24   < > 25 23 24   GLUCOSE 117*   < > 124* 130* 129*  BUN 47*   < > 42* 59* 78*  CREATININE 1.82*   < > 1.86* 2.38* 2.95*  CALCIUM 8.8*   < > 8.6* 8.6* 8.8*  PROT 6.7  --   --   --   --   ALBUMIN 3.1*  --   --   --   --   AST 22  --   --   --   --   ALT 8  --   --   --   --   ALKPHOS 56  --   --   --   --   BILITOT 1.2  --   --   --   --  GFRNONAA 24*   < > 23* 17* 13*  GFRAA 28*   < > 27* 20* 15*  ANIONGAP 16*   < > 12 14 15    < > = values in this interval not displayed.     Hematology Recent Labs  Lab 03/20/20 0035 03/21/20 0658 03/22/20 0525  WBC 9.5 9.7 13.0*  RBC 2.88* 2.93* 3.18*  HGB 8.3* 8.8* 9.4*  HCT 27.6* 28.4* 30.7*  MCV 95.8 96.9 96.5  MCH 28.8 30.0 29.6  MCHC 30.1 31.0 30.6  RDW 17.2* 18.2* 18.5*  PLT 244 250 273    BNP Recent Labs  Lab 03/19/20 1643  BNP 4,371.6*     DDimer No results for input(s): DDIMER in the last 168 hours.   Radiology    MR ANGIO HEAD WO CONTRAST  Result Date: 03/20/2020 CLINICAL DATA:  Neuro deficit, acute, stroke suspected. EXAM: MRA HEAD WITHOUT CONTRAST TECHNIQUE: Angiographic images of the Circle of Willis were obtained using MRA technique without intravenous contrast. COMPARISON:  Brain MRI 03/20/2020, head CT 03/19/2020 FINDINGS: The intracranial internal carotid arteries are patent. The M1 middle cerebral arteries are patent without significant stenosis. No M2 proximal branch occlusion or high-grade proximal stenosis is identified. The anterior cerebral arteries are patent. The visualized intracranial vertebral arteries are patent. The intracranial left vertebral artery is dominant. The basilar artery is patent. The posterior cerebral arteries are  patent proximally without significant stenosis. Posterior communicating arteries are hypoplastic or absent bilaterally. No intracranial aneurysm is identified. IMPRESSION: No intracranial large vessel occlusion or proximal high-grade stenosis. Electronically Signed   By: Jackey LogeKyle  Golden DO   On: 03/20/2020 15:40   MR ANGIO NECK WO CONTRAST  Result Date: 03/20/2020 CLINICAL DATA:  Neuro deficit, acute, stroke suspected. EXAM: MRA NECK WITHOUT CONTRAST TECHNIQUE: Angiographic images of the neck were obtained using MRA technique without intravenous contrast. Carotid stenosis measurements (when applicable) are obtained utilizing NASCET criteria, using the distal internal carotid diameter as the denominator. COMPARISON:  Noncontrast brain MRI 03/20/2020, concurrently performed MRA head 03/20/2020, head CT 03/19/2020. FINDINGS: Aberrant right subclavian artery. Motion degradation and non-contrast technique precludes adequate evaluation for stenosis within the proximal right subclavian artery. No hemodynamically significant stenosis of the proximal left subclavian artery. The bilateral common and internal carotid arteries are patent within the neck without evidence of hemodynamically significant stenosis (50% or greater). There is atherosclerotic plaque within both carotid bifurcations and proximal internal carotid arteries. Non-contrast technique and motion artifact precludes adequate evaluation of the origins of the vertebral arteries. Within this limitation, the vertebral arteries are patent within the neck bilaterally with antegrade flow and without appreciable significant stenosis. The left vertebral artery is dominant. IMPRESSION: The bilateral common and internal carotid arteries are patent within the neck without evidence of hemodynamically significant stenosis. Atherosclerotic plaque is present within both carotid bifurcations and proximal internal carotid arteries. Non-contrast technique and motion artifact  precludes adequate evaluation of the origins of the vertebral arteries. Within this limitation, the cervical vertebral arteries are patent within the neck without appreciable significant stenosis. Aberrant right subclavian artery. Electronically Signed   By: Jackey LogeKyle  Golden DO   On: 03/20/2020 15:51   US RENAL  Result Date: 03/21/2020 CLINICAL DATA:  Acute kidney injury EXAM: RENAL / URINARY TRACT ULTRASOUND COMPLETE COMPARISON:  MRI 07/08/2016 FINDINGS: Right Kidney: Renal measurements: 8.9 x 4.8 x 4.8 cm = volume: 105 mL. Echogenicity within normal limits. 8 mm simple cyst at the midpole. No solid mass, shadowing stone, or hydronephrosis visualized.  Left Kidney: Renal measurements: 10.1 x 5.0 x 4.0 cm = volume: 106 mL. Echogenicity within normal limits. 4 mm echogenic stone within the midpole of the left kidney. 2.1 cm simple cyst at the upper pole. No solid mass or hydronephrosis visualized. Bladder: Appears normal for degree of bladder distention. Other: None. IMPRESSION: 1. No evidence of obstructive uropathy. 2. Nonobstructing 4 mm left renal stone. 3. Simple bilateral renal cysts. Electronically Signed   By: Duanne Guess D.O.   On: 03/21/2020 13:05   EEG adult  Result Date: 03/20/2020 Charlsie Quest, MD     03/20/2020 11:02 AM Patient Name: Shanikqua Zarzycki MRN: [id number] Epilepsy Attending: Charlsie Quest Referring Physician/Provider: Dr Odie Sera Date: 03/20/2020 Duration: 23.17 mins Patient history: 86 year old female with seizure-like activity.  EEG to evaluate for seizures. Level of alertness: Awake, asleep AEDs during EEG study: None Technical aspects: This EEG study was done with scalp electrodes positioned according to the 10-20 International system of electrode placement. Electrical activity was acquired at a sampling rate of 500Hz  and reviewed with a high frequency filter of 70Hz  and a low frequency filter of 1Hz . EEG data were recorded continuously and digitally stored. Description: The  posterior dominant rhythm consists of 8-9 Hz activity of moderate voltage (25-35 uV) seen predominantly in posterior head regions, symmetric and reactive to eye opening and eye closing. Sleep was characterized by vertex waves, sleep spindles (12 to 14 Hz), maximal frontocentral region.    Hyperventilation and photic stimulation were not performed.   IMPRESSION: This study is within normal limits. No seizures or epileptiform discharges were seen throughout the recording. Priyanka   DG ESOPHAGUS W SINGLE CM (SOL OR THIN BA)  Result Date: 03/21/2020 CLINICAL DATA:  Dysphagia, globus sensation EXAM: ESOPHOGRAM/BARIUM SWALLOW TECHNIQUE: Single contrast examination was performed using thin barium or water soluble. FLUOROSCOPY TIME:  Fluoroscopy Time:  36 seconds Radiation Exposure Index (if provided by the fluoroscopic device): 2.9 mGy COMPARISON:  None. FINDINGS: Patient swallowed barium without difficulty. There is narrowing of the distal esophagus at the gastroesophageal junction. This is likely responsible for holdup of contrast seen throughout the study. Motility is normal. Small hiatal hernia. No definite gastroesophageal reflux. IMPRESSION: Narrowing of the distal esophagus at the gastroesophageal junction likely responsible for holdup of contrast seen throughout the study. Retained contrast in the esophagus corresponded to symptoms. Electronically Signed   By: M.D.   On: 03/21/2020 14:48    Cardiac Studies   Echo 03/13/20: 1. Left ventricular ejection fraction, by estimation, is 40 to 45%. The  left ventricle has mildly decreased function. The left ventricle  demonstrates global hypokinesis. There is mild concentric left ventricular  hypertrophy. Left ventricular diastolic  function could not be evaluated.  2. Right ventricular systolic function is mildly reduced. The right  ventricular size is mildly enlarged. There is normal pulmonary artery  systolic pressure.  3. Left  atrial size was severely dilated.  4. Right atrial size was moderately dilated.  5. There is severe thickening and calcification of the subvalvular mitral  apparatus. There is severe annular calcification. The mitral valve has  both degenerative and rheumatic features. Moderate mitral valve  regurgitation. Mild to moderate mitral  stenosis. The mean mitral valve gradient is 6.0 mmHg with average heart  rate of 88 bpm.  6. The aortic valve is tricuspid. Aortic valve regurgitation is mild to  moderate. Severe aortic valve stenosis. Aortic valve Vmax measures 4.38  m/s.   Patient Profile  86 y.o. female with severe bioprosthetic aortic valve stenosis, SVT, hypertension, and hypothyroidism. Admitted with evidence of embolic stroke and anemia. She converted to Afib RVR 0430 03/22/20.  Assessment & Plan    New onset atrial fibrillation with RVR - pt converted at approximately 0430 this morning to Afib RVR  - rates currently in the 130-140s - we are limited in what we can do for rate control given her renal function and EF - curbside EP --> will start IV amiodarone with bolus - she is not currently a candidate for anticoagulation given her anemia - rate control is complicated by hypotension in the 70-80s systolic --> have ordered 250 cc of 5g albumin, but may need additional gentle fluid - she is tolerating the rhythm and rate surprisingly well for now, only complains of chest pressure   Severe stenosis of bioprosthetic aortic valve Mitral stenosis - valve placed in 2014 - mean gradient 50 mmHg - was evaluated by structural heart team, but anemia has complicated her workup thusfar - do not suspect at this point she would tolerate    Symptomatic Anemia - unclear etiology - GI consulted for possible upper endoscopy tentatively scheduled for today, but now in Afib RVR - Hb today is 9.4 (8.8) - pt states she may have had blood in her urine this morning, but reports dark yellow -  likely related to her CKD   Acute on chronic renal insufficiency stage III - CrCl is now 9.8 - sCr 2.95 (2.38) - urine output not charted - ordered again, this is very important in this critically ill patient   Acute on chronic systolic heart failure - likely diastolic dysfunction - EF 40-45% - mildly volume up, but appears similar to the past several days - gentle hydration for symptomatic hypotension - follow volume status   Palliative care following      For questions or updates, please contact [facility] Please consult [email or web address] for contact info under        Signed, Marcelino Duster, PA  03/22/2020, 7:54 AM    Patient seen and examined with Bettina Gavia PA.  Agree as above, with the following exceptions and changes as noted below. She has weakness and nausea, and feels off in her chest. We discussed implications on continued hypotension. Gen: appears fatigued, CV: irregular tachycardia Lungs: clear, Abd: soft, Extrem: Warm, well perfused, no edema, Neuro/Psych: alert and oriented x 3, normal mood and affect. All available labs, radiology testing, previous records reviewed.  It is not surprising that she has atrial fibrillation, currently has A. fib with rapid ventricular response.  Challenging situation given hypotension, this will complicate rate control.  We will initiate IV amiodarone, understanding that she is not on anticoagulation.  Need for rate control without significant impact on the blood pressure, unable to use beta-blockade or CCB at this time.  We will continue IV amiodarone today.  I discussed his very complex situation with the patient in frank terms.  Have also discussed her care with nephrology.  We have agreed that avoiding prolonged hypotension would be optimal.  Hypotension will be difficult to manage if atrial fibrillation rates remain elevated.  Agree with GI that endoscopy would be helpful to better understand the source of her anemia.  At this  time with hypotension and tachycardia, it may be difficult to perform endoscopy.  If her rates are better this afternoon or she converts to sinus rhythm, could still consider.  Parke Poisson, MD 03/22/20 9:34 AM

## 2020-03-22 NOTE — Progress Notes (Signed)
[facility] Progress Note    Assessment/ Plan:   1.  AKI on CKD 3b: CKD 3b likely d/t HTN and natural aging.  AKI likely hypotension-mediated especially now that we know she has Afib with RVR in the setting of low BP.  renal US unremarkable, UA some WBCs but few bacteria.  She is a very poor candidate for dialysis which we discussed today.  I also told her that we may be at a point where none of this is fixable.  We discussed that she is likely to pass away from her medical problems if there are no treatment options.  Pall care has been consulted--> appreciate assistance  2.  Mild hyperkalemia: resolved  3.  Chronic systolic CHF EF 40-45%: newly diagnosed, holding metoprolol and Lasix for now  4.  Bilateral cortical infarcts: neuro following  5.  Seizure- like activity- seizure precautions, neuro following  6.  Anemia: GI has been consulted, appreciate assistance  7.  Afib with RVR: noted here for the first time, likely source of strokes and responsible for AKI 2/2 decreased effective arterial blood flow  8.  Dispo: admitted  Subjective:    In Afib RVR overnight with low BP.  Cr up to 3.      Objective:   BP (!) 86/58   Pulse (!) 123   Temp 97.6 F (36.4 C) (Oral)   Resp 20   Ht 5\' 2"  (1.575 m)   Wt 55.7 kg Comment: scale a  SpO2 99%   BMI 22.48 kg/m   Intake/Output Summary (Last 24 hours) at 03/22/2020 1034 Last data filed at 03/21/2020 2100 Gross per 24 hour  Intake 240 ml  Output --  Net 240 ml   Weight change: 0.227 kg  Physical Exam: GEN NAD, sitting in bed HEENT EOMI PERRL PULM normal WOB, faint bibasilar crackles CV tachycardic, irregular ABD soft, nontender EXT trace LE edema NEURO AAO x 3 nonfocal SKIN no rashes or lesions  Imaging: MR ANGIO HEAD WO CONTRAST  Result Date: 03/20/2020 CLINICAL DATA:  Neuro deficit, acute, stroke suspected. EXAM: MRA HEAD WITHOUT CONTRAST TECHNIQUE: Angiographic images of the Circle of Willis were  obtained using MRA technique without intravenous contrast. COMPARISON:  Brain MRI 03/20/2020, head CT 03/19/2020 FINDINGS: The intracranial internal carotid arteries are patent. The M1 middle cerebral arteries are patent without significant stenosis. No M2 proximal branch occlusion or high-grade proximal stenosis is identified. The anterior cerebral arteries are patent. The visualized intracranial vertebral arteries are patent. The intracranial left vertebral artery is dominant. The basilar artery is patent. The posterior cerebral arteries are patent proximally without significant stenosis. Posterior communicating arteries are hypoplastic or absent bilaterally. No intracranial aneurysm is identified. IMPRESSION: No intracranial large vessel occlusion or proximal high-grade stenosis. Electronically Signed   By: 05/19/2020 DO   On: 03/20/2020 15:40   MR ANGIO NECK WO CONTRAST  Result Date: 03/20/2020 CLINICAL DATA:  Neuro deficit, acute, stroke suspected. EXAM: MRA NECK WITHOUT CONTRAST TECHNIQUE: Angiographic images of the neck were obtained using MRA technique without intravenous contrast. Carotid stenosis measurements (when applicable) are obtained utilizing NASCET criteria, using the distal internal carotid diameter as the denominator. COMPARISON:  Noncontrast brain MRI 03/20/2020, concurrently performed MRA head 03/20/2020, head CT 03/19/2020. FINDINGS: Aberrant right subclavian artery. Motion degradation and non-contrast technique precludes adequate evaluation for stenosis within the proximal right subclavian artery. No hemodynamically significant stenosis of the proximal left subclavian artery. The bilateral common and internal carotid arteries are patent within  the neck without evidence of hemodynamically significant stenosis (50% or greater). There is atherosclerotic plaque within both carotid bifurcations and proximal internal carotid arteries. Non-contrast technique and motion artifact precludes  adequate evaluation of the origins of the vertebral arteries. Within this limitation, the vertebral arteries are patent within the neck bilaterally with antegrade flow and without appreciable significant stenosis. The left vertebral artery is dominant. IMPRESSION: The bilateral common and internal carotid arteries are patent within the neck without evidence of hemodynamically significant stenosis. Atherosclerotic plaque is present within both carotid bifurcations and proximal internal carotid arteries. Non-contrast technique and motion artifact precludes adequate evaluation of the origins of the vertebral arteries. Within this limitation, the cervical vertebral arteries are patent within the neck without appreciable significant stenosis. Aberrant right subclavian artery. Electronically Signed   By: Jackey Loge DO   On: 03/20/2020 15:51   US RENAL  Result Date: 03/21/2020 CLINICAL DATA:  Acute kidney injury EXAM: RENAL / URINARY TRACT ULTRASOUND COMPLETE COMPARISON:  MRI 07/08/2016 FINDINGS: Right Kidney: Renal measurements: 8.9 x 4.8 x 4.8 cm = volume: 105 mL. Echogenicity within normal limits. 8 mm simple cyst at the midpole. No solid mass, shadowing stone, or hydronephrosis visualized. Left Kidney: Renal measurements: 10.1 x 5.0 x 4.0 cm = volume: 106 mL. Echogenicity within normal limits. 4 mm echogenic stone within the midpole of the left kidney. 2.1 cm simple cyst at the upper pole. No solid mass or hydronephrosis visualized. Bladder: Appears normal for degree of bladder distention. Other: None. IMPRESSION: 1. No evidence of obstructive uropathy. 2. Nonobstructing 4 mm left renal stone. 3. Simple bilateral renal cysts. Electronically Signed   By: Duanne Guess D.O.   On: 03/21/2020 13:05   EEG adult  Result Date: 03/20/2020 Charlsie Quest, MD     03/20/2020 11:02 AM Patient Name: Morgan Holland MRN: [id number] Epilepsy Attending: Charlsie Quest Referring Physician/Provider: Dr Odie Sera Date:  03/20/2020 Duration: 23.17 mins Patient history: 86 year old female with seizure-like activity.  EEG to evaluate for seizures. Level of alertness: Awake, asleep AEDs during EEG study: None Technical aspects: This EEG study was done with scalp electrodes positioned according to the 10-20 International system of electrode placement. Electrical activity was acquired at a sampling rate of 500Hz  and reviewed with a high frequency filter of 70Hz  and a low frequency filter of 1Hz . EEG data were recorded continuously and digitally stored. Description: The posterior dominant rhythm consists of 8-9 Hz activity of moderate voltage (25-35 uV) seen predominantly in posterior head regions, symmetric and reactive to eye opening and eye closing. Sleep was characterized by vertex waves, sleep spindles (12 to 14 Hz), maximal frontocentral region.    Hyperventilation and photic stimulation were not performed.   IMPRESSION: This study is within normal limits. No seizures or epileptiform discharges were seen throughout the recording. Priyanka   DG ESOPHAGUS W SINGLE CM (SOL OR THIN BA)  Result Date: 03/21/2020 CLINICAL DATA:  Dysphagia, globus sensation EXAM: ESOPHOGRAM/BARIUM SWALLOW TECHNIQUE: Single contrast examination was performed using thin barium or water soluble. FLUOROSCOPY TIME:  Fluoroscopy Time:  36 seconds Radiation Exposure Index (if provided by the fluoroscopic device): 2.9 mGy COMPARISON:  None. FINDINGS: Patient swallowed barium without difficulty. There is narrowing of the distal esophagus at the gastroesophageal junction. This is likely responsible for holdup of contrast seen throughout the study. Motility is normal. Small hiatal hernia. No definite gastroesophageal reflux. IMPRESSION: Narrowing of the distal esophagus at the gastroesophageal junction likely responsible for holdup of contrast  seen throughout the study. Retained contrast in the esophagus corresponded to symptoms. Electronically Signed   By:  Guadlupe Spanish M.D.   On: 03/21/2020 14:48    Labs: BMET Recent Labs  Lab 03/16/20 0822 03/19/20 1644 03/20/20 0035 03/21/20 0658 03/22/20 0525  NA 134* 134* 139 136 138  K 3.9 4.7 4.4 5.2* 4.9  CL 99 94* 102 99 99  CO2 25 24 25 23 24   GLUCOSE 130* 117* 124* 130* 129*  BUN 24* 47* 42* 59* 78*  CREATININE 1.51* 1.82* 1.86* 2.38* 2.95*  CALCIUM 8.7* 8.8* 8.6* 8.6* 8.8*   CBC Recent Labs  Lab 03/19/20 1644 03/20/20 0035 03/21/20 0658 03/22/20 0525  WBC 8.8 9.5 9.7 13.0*  NEUTROABS 6.0  --   --   --   HGB 9.4* 8.3* 8.8* 9.4*  HCT 30.4* 27.6* 28.4* 30.7*  MCV 95.6 95.8 96.9 96.5  PLT 255 244 250 273    Medications:    . aspirin EC  81 mg Oral Daily  . atorvastatin  20 mg Oral Daily  . levothyroxine  100 mcg Oral Q0600  . metoprolol tartrate  12.5 mg Oral BID  . pantoprazole  40 mg Oral Daily  . sodium chloride flush  3 mL Intravenous Q12H      05/22/20 MD 03/22/2020, 10:34 AM

## 2020-03-22 NOTE — Progress Notes (Signed)
Paged cardiology again D/T low BP. Verbal order for 250cc NaCl bolus and give metoprolol when SPB >/= 90.

## 2020-03-22 NOTE — Progress Notes (Addendum)
STROKE TEAM PROGRESS NOTE    Interval History   Palliative care NP is at the bedside. Pt sitting in bed, no acute distress. She stated that she feels tired. No neuro change. Noted that pt had atrial fibrillation with RVR over night and she was put on amiodarone drip. Metoprolol started. Per cardiology, pt not candidate for [facility] due to severe anemia needing PRBC. BP still lowish and down to 70s from time to time. AKI worsened but not candidate for dialysis per nephrology. Palliative Care consulted and currently DNI. Granddaughter will be here tomorrow to further discuss with all the providers and palliative care services.    Pertinent Lab Work and Imaging    03/13/20 Echocardiogram Complete  1. Left ventricular ejection fraction, by estimation, is 40 to 45%. The left ventricle has mildly decreased function. The left ventricle demonstrates global hypokinesis. There is mild concentric left ventricular  hypertrophy. Left ventricular diastolic function could not be evaluated.  2. Right ventricular systolic function is mildly reduced. The right ventricular size is mildly enlarged. There is normal pulmonary artery systolic pressure.  3. Left atrial size was severely dilated.  4. Right atrial size was moderately dilated.  5. There is severe thickening and calcification of the subvalvular mitral apparatus.There is severe annular calcification. The mitral valve has both degenerative and rheumatic features. Moderate mitral valve regurgitation. Mild to moderate mitral stenosis. The mean mitral valve gradient is 6.0 mmHg with average heart rate of 88 bpm.  6. The aortic valve is tricuspid. Aortic valve regurgitation is mild to moderate. Severe aortic valve stenosis. Aortic valve Vmax measures 4.38 m/s.   03/19/20 CT Head WO IV Contrast Advanced chronic ischemic disease with no acute intracranial abnormality by CT.  03/20/20 MR Angio Neck WO Contrast  The bilateral common and internal carotid arteries are  patent within the neck without evidence of hemodynamically significant stenosis. Atherosclerotic plaque is present within both carotid bifurcations and proximal internal carotid arteries. Non-contrast technique and motion artifact precludes adequate evaluation of the origins of the vertebral arteries. Within this limitation, the cervical vertebral arteries are patent within the neck without appreciable significant stenosis. Aberrant right subclavian artery.  03/20/20 MR Angio Head WO Contrast  The intracranial internal carotid arteries are patent. The M1 middle cerebral arteries are patent without significant stenosis. No M2 proximal branch occlusion or high-grade proximal stenosis is identified. The anterior cerebral arteries are patent.  The visualized intracranial vertebral arteries are patent. The intracranial left vertebral artery is dominant. The basilar artery is patent. The posterior cerebral arteries are patent proximally without significant stenosis. Posterior communicating arteries are hypoplastic or absent bilaterally.  No intracranial aneurysm is identified.  03/20/20 MRI Brain WO IV Contrast 1. Small acute to subacute infarcts along the bilateral cerebral cortex, likely embolic. 2. Background of advanced chronic small vessel disease.    Physical Examination    Temp:  [97.5 F (36.4 C)-98.8 F (37.1 C)] 98.8 F (37.1 C) (09/10 1642) Pulse Rate:  [51-166] 100 (09/10 1642) Resp:  [18-20] 18 (09/10 1500) BP: (67-96)/(48-72) 81/69 (09/10 1642) SpO2:  [94 %-100 %] 96 % (09/10 1642) Weight:  [55.7 kg] 55.7 kg (09/10 0338)  General - Well nourished, well developed, in no apparent distress.  Ophthalmologic - fundi not visualized due to noncooperation.  Cardiovascular - irregularly irregular heart rate and rhythm.  Neuro - patient lying in bed, awake alert, mildly lethargic, hard of hearing, however orientated to age, people, and months, but not orientated to year.  Mild left  nasolabial fold flattening, however visual field full, no gaze deviation, tongue midline, PERRL, EOMI.  Moving all extremities symmetrically and equally.  Sensation symmetrical, finger-to-nose grossly intact.  Gait not tested.   Assessment and Plan   Ms. Zaleah Ternes is a 86 y.o. female w/pmh of HTN,HLD, BPPV, hypothyroidism, GERD, chronic GI bleeding, iron deficiency anemia, atrial valve replacement, myocardial infarct, severe aortic stenosis status post AVR now with severe aortic valve stenosis, recently diagnosed with congestive heart failure during her last admission at [facility] [place] (03/12/20-03/17/20) who presented with hypotension w/SBP drop to the 80's and seizure like activity. She was preparing to eat lunch on 03/18/20 when she began to have shaking motions that lasted for 15-20 s and was not responding during this time. Afterwards she was somnolent. Due to this activity, an MRI of the Brain was obtained that showed small acute to subacute infarcts along the bilateral cerebral cortex. Of note, she was outside the time window for IVTPA and was not a candidate for mechanical thrombectomy given low NIHSS.  #Small Punctate Stroke Along the Bilateral Cerebral Convexity and Right Caudate Body - likely hypotension with watershed distribution vs. cardioemoblic from PAF  MR Angio Head and Neck did not reveal significant stenosis, did show atherosclerotic plaque present within both carotid bifurcations.   An echocardiogram was not repeated given she recently had one done on 03/13/20 that revealed an EF of 40 to 45 %, global hypokinesis of the left ventricle LVH and severe aortic valve stenosis.   LDL 75 and Hemoglobin A1C 4.8.   Agree with cardiology, GI and nephrology, pt not good candidate for anticoagulation at this time.   Now on ASA 81mg . Continue if OK with GI and cardiology  Palliative care has been consulted.  #Newly diagnosed HF  #Newly diangosed AF RVR #Hypoetnsion  #Severe aortic stenosis  Per  granddaughter, hypotension at home after last discharge  Currently her SBP is trending in the upper 70s-90 range.   On metoprolol now - close monitor BP  On amiodarone drip  Cardiology on board  Palliative care on board  #Seizure like Activity  She presented with seizure like activity noted by family. A routine EEG was obtained that did not show an epileptiform discharges. It is possible that her strokes, which are cortical caused her to seize. Will defer on AEDs at this time given first time seizure.   #Hyperlipidemia From a stroke prevention stand point, the LDL goal is < 70. Her LDL is 75. She was placed on Atorvastatin 20 mg this admission and is to continue this medication at discharge.  #Iron Deficiency Anemia   Hemoglobin 8.8->9.4  During her prior admission, her iron, folate and B12 were assessed, with all found to be low. She was given 1 unit of PRBC and  Folate + B12 supplements were initiated.  GI on board and are recommending a BA Esophagram + possible EGD.   EGD not able to perform due to hypotension and afib RVR  On ASA now  # AKI on CKD  Cre 1.86-2.38-2.95  Nephrology on board  Not a candidate for dialysis  Palliative care on board  Hospital day # 3  Discussed with palliative care NP, patient clearly declined intubation in the future, will put on DNI.  Granddaughter will come tomorrow to spend majority of the day to discuss with all the providers in the palliative care services.  Likely consider DNR.  Will follow.  09-12-1987, MD PhD Stroke Neurology 03/22/2020 5:32 PM  To contact Stroke Continuity provider, please refer to [email or web address] After hours, contact General Neurology

## 2020-03-23 DIAGNOSIS — F411 Generalized anxiety disorder: Secondary | ICD-10-CM

## 2020-03-23 DIAGNOSIS — Z66 Do not resuscitate: Secondary | ICD-10-CM

## 2020-03-23 DIAGNOSIS — R110 Nausea: Secondary | ICD-10-CM

## 2020-03-23 LAB — BASIC METABOLIC PANEL
Anion gap: 19 — ABNORMAL HIGH (ref 5–15)
BUN: 100 mg/dL — ABNORMAL HIGH (ref 8–23)
CO2: 20 mmol/L — ABNORMAL LOW (ref 22–32)
Calcium: 8 mg/dL — ABNORMAL LOW (ref 8.9–10.3)
Chloride: 97 mmol/L — ABNORMAL LOW (ref 98–111)
Creatinine, Ser: 3.82 mg/dL — ABNORMAL HIGH (ref 0.44–1.00)
GFR calc Af Amer: 11 mL/min — ABNORMAL LOW (ref >60)
GFR calc non Af Amer: 10 mL/min — ABNORMAL LOW (ref >60)
Glucose, Bld: 131 mg/dL — ABNORMAL HIGH (ref 70–99)
Potassium: 5.9 mmol/L — ABNORMAL HIGH (ref 3.5–5.1)
Sodium: 136 mmol/L (ref 135–145)

## 2020-03-23 LAB — CBC
HCT: 30.3 % — ABNORMAL LOW (ref 36.0–46.0)
Hemoglobin: 9.1 g/dL — ABNORMAL LOW (ref 12.0–15.0)
MCH: 29.1 pg (ref 26.0–34.0)
MCHC: 30 g/dL (ref 30.0–36.0)
MCV: 96.8 fL (ref 80.0–100.0)
Platelets: 156 10*3/uL (ref 150–400)
RBC: 3.13 MIL/uL — ABNORMAL LOW (ref 3.87–5.11)
RDW: 18.9 % — ABNORMAL HIGH (ref 11.5–15.5)
WBC: 17.2 10*3/uL — ABNORMAL HIGH (ref 4.0–10.5)
nRBC: 0.6 % — ABNORMAL HIGH (ref 0.0–0.2)

## 2020-03-23 MED ORDER — AMIODARONE HCL 100 MG PO TABS: 100.0000 mg | ORAL_TABLET | Freq: Every day | ORAL | 0 refills | Status: AC

## 2020-03-23 MED ORDER — OXYCODONE HCL 5 MG/5ML PO SOLN: 5.0000 mg | Freq: Four times a day (QID) | ORAL | Status: DC | PRN

## 2020-03-23 MED ORDER — POLYETHYLENE GLYCOL 3350 17 G PO PACK: 17.0000 g | PACK | Freq: Every day | ORAL | Status: DC | PRN

## 2020-03-23 MED ORDER — ATROPINE SULFATE 1 % OP SOLN
2.0000 [drp] | Freq: Four times a day (QID) | OPHTHALMIC | Status: DC | PRN
  Filled 2020-03-23: qty 2

## 2020-03-23 MED ORDER — FUROSEMIDE 40 MG PO TABS: 40.0000 mg | ORAL_TABLET | ORAL | 11 refills | Status: AC | PRN

## 2020-03-23 MED ORDER — SODIUM ZIRCONIUM CYCLOSILICATE 10 G PO PACK
10.0000 g | PACK | Freq: Once | ORAL | Status: AC
  Administered 2020-03-23: 10 g via ORAL
  Filled 2020-03-23: qty 1

## 2020-03-23 MED ORDER — PROMETHAZINE HCL 25 MG/ML IJ SOLN: 12.5000 mg | Freq: Three times a day (TID) | INTRAMUSCULAR | Status: DC | PRN

## 2020-03-23 MED ORDER — AMIODARONE HCL 100 MG PO TABS
100.0000 mg | ORAL_TABLET | Freq: Every day | ORAL | Status: DC
  Filled 2020-03-23: qty 1

## 2020-03-23 MED ORDER — ONDANSETRON 4 MG PO TBDP
8.0000 mg | ORAL_TABLET | Freq: Three times a day (TID) | ORAL | Status: DC | PRN
  Administered 2020-03-23: 8 mg via ORAL
  Filled 2020-03-23 (×2): qty 2

## 2020-03-23 MED ORDER — ALPRAZOLAM 0.25 MG PO TABS: 0.2500 mg | ORAL_TABLET | Freq: Three times a day (TID) | ORAL | 0 refills | Status: AC | PRN

## 2020-03-23 MED ORDER — ATORVASTATIN CALCIUM 20 MG PO TABS: 20.0000 mg | ORAL_TABLET | Freq: Every day | ORAL | 0 refills | Status: AC

## 2020-03-23 MED ORDER — METOCLOPRAMIDE HCL 5 MG PO TABS
5.0000 mg | ORAL_TABLET | Freq: Three times a day (TID) | ORAL | Status: DC
  Administered 2020-03-23: 5 mg via ORAL
  Filled 2020-03-23: qty 1

## 2020-03-23 MED ORDER — ALPRAZOLAM 0.25 MG PO TABS: 0.2500 mg | ORAL_TABLET | Freq: Three times a day (TID) | ORAL | Status: DC | PRN

## 2020-03-23 MED ORDER — METOCLOPRAMIDE HCL 5 MG PO TABS: 5.0000 mg | ORAL_TABLET | Freq: Three times a day (TID) | ORAL | 0 refills | Status: AC

## 2020-03-23 MED ORDER — OXYCODONE HCL 5 MG/5ML PO SOLN
5.0000 mg | Freq: Four times a day (QID) | ORAL | 0 refills | Status: AC | PRN
Start: 2020-03-23 — End: ?

## 2020-03-23 MED ORDER — PROMETHAZINE HCL 6.25 MG/5ML PO SYRP: 6.2500 mg | ORAL_SOLUTION | Freq: Four times a day (QID) | ORAL | 0 refills | Status: AC | PRN

## 2020-03-23 NOTE — Plan of Care (Signed)
  Problem: Education: Goal: Knowledge of General Education information will improve Description: Including pain rating scale, medication(s)/side effects and non-pharmacologic comfort measures Outcome: Adequate for Discharge   Problem: Health Behavior/Discharge Planning: Goal: Ability to manage health-related needs will improve Outcome: Adequate for Discharge   Problem: Clinical Measurements: Goal: Ability to maintain clinical measurements within normal limits will improve Outcome: Adequate for Discharge Goal: Will remain free from infection Outcome: Adequate for Discharge Goal: Diagnostic test results will improve Outcome: Adequate for Discharge Goal: Respiratory complications will improve Outcome: Adequate for Discharge Goal: Cardiovascular complication will be avoided Outcome: Adequate for Discharge   Problem: Coping: Goal: Level of anxiety will decrease Outcome: Adequate for Discharge   Problem: Elimination: Goal: Will not experience complications related to bowel motility Outcome: Adequate for Discharge Goal: Will not experience complications related to urinary retention Outcome: Adequate for Discharge   Problem: Pain Managment: Goal: General experience of comfort will improve Outcome: Adequate for Discharge   Problem: Safety: Goal: Ability to remain free from injury will improve Outcome: Adequate for Discharge   Problem: Skin Integrity: Goal: Risk for impaired skin integrity will decrease Outcome: Adequate for Discharge   Problem: Education: Goal: Ability to demonstrate management of disease process will improve Outcome: Adequate for Discharge Goal: Ability to verbalize understanding of medication therapies will improve Outcome: Adequate for Discharge Goal: Individualized Educational Video(s) Outcome: Adequate for Discharge   Problem: Activity: Goal: Capacity to carry out activities will improve Outcome: Adequate for Discharge   Problem: Cardiac: Goal:  Ability to achieve and maintain adequate cardiopulmonary perfusion will improve Outcome: Adequate for Discharge

## 2020-03-23 NOTE — Discharge Summary (Signed)
Physician Discharge Summary  Morgan Holland WUJ:[id number] DOB: 1926-10-19 DOA: 03/19/2020  PCP: Ardyth Gal, MD  Admit date: 03/19/2020 Discharge date: 03/23/2020  Admitted From: home Disposition:  Home w/ hospice  Recommendations for Outpatient Follow-up:  Follow up with hospice at home  Home Health:no  Equipment/Devices: none  Discharge Condition: Stable Code Status:   Code Status: DNR Diet recommendation:  Diet Order             Diet full liquid Room service appropriate? Yes; Fluid consistency: Thin  Diet effective now                    Brief/Interim Summary: 86 y.o. female with medical history significant for recent diagnosis of systolic CHF with EF 40 to 45%, severe aortic stenosis, hx of covid, chronic kidney disease, chronic anemia, hypothyroidism, and hypertension, now presenting to the emergency department with low blood pressures and seizure-like episode.  Patient was discharged from the hospital on 03/16/2020 after admission for newly diagnosed CHF.  Since returning home, her systolic blood pressure has been in the 80s much of the time and her granddaughter whom she lives with has been holding her metoprolol.  Patient complains of intermittent nausea without vomiting and states that she has been experiencing this ever since she was hospitalized with Covid last December.  She denies any chest pain or palpitations, has not noticed any leg swelling, and does not feel particularly short of breath.  Per report of her granddaughter, the patient was preparing to eat lunch on 03/18/2020 when she began shaking all over and would not respond to questioning.  This lasted 15 to 20 seconds and was followed by a period of somnolence.  Granddaughter has a child with epilepsy and states that this looked just like generalized seizures she has seen before along with a postictal period.  Patient reports that she was experiencing nausea prior to the episode, but again states that she has had this  frequently for the past several months.She has never had any seizure-like episodes previously per family report.   [facility] ED Course:Upon arrival to the ED, patient is found to be afebrile, saturating low 90s on room air, and with blood pressure 90/70.  EKG features sinus rhythm with T wave inversions.  Head CT is notable for advanced chronic ischemic disease but no acute intracranial abnormality.  Chest x-ray with cardiomegaly, prominent interstitial markings, and possible small bilateral pleural effusions.  Chemistry panel reveals a creatinine 1.87, up from 1.51 two days earlier.CBC notable for stable normocytic anemia.  Troponin is elevated to 796 and BNP is elevated 4372.Covid PCR is negative.  Cardiology was consulted and was admitted" Per daughter PT would not come on Monday when she had seizure like episode and BP was low in 80/50 and feeling bad at home so granddaughter brought to hospital on Tuesday. Patient was admitted underwent extensive work-up found to have complex issues with acute other illness. She was found to have a small bilateral stroke, also having hypotension in the setting of new onset A. fib with RVR, AKI with worsening renal failure, hyperkalemia.  Blood pressure remains soft, at this time nephrology, neurology, gastroenterology, cardiology has seen the patient  Given her complex medical condition elderly and that she was worsening despite medical therapy, palliative care was consulted.  After subsequent family meeting family opted for palliative care and hospice.  Patient will be discharged to home with hospice today.  Patient's granddaughter would like to take her home today as soon  as possible to spend time with her family given that patient is declining.  Discharge Diagnoses:  Acute on chronic systolic CHF, along with likely diastolic dysfunction:lvef 40-45%, bnp was 4371 on admission. Unabel to use lasix 2/2 hypotension and aki New onset A. fib with RVR now on  amiodarone. Hypotension-multifactorial likely from A. fib with RVR with decreased effective cardiac output,/decreased intake/severe aortic stenosis AKI on CKD IIIb: With worsening renal failure and hyperkalemia and now no significant urine output.  Nephrology has seen the patient and has palliative hospice care . likely from her hypotension meds-ace/arb/diuretics. Small bilateral acute to subacute infarcts: Acute confusion.  Seen by neurology at this point no further recommendation.was on aspirin 81, lipitor.MRA neck and MRA head no acute finding.  Her ldl is 75.  Hemoglobin A1c 4.8.  Hyperkalemia: lokelam 10 mg x1 today Nausea keep on Reglan Phenergan Dysphagia with globus sensation at level of ZOX:WRUEAV on board,  barium swallow shows narrowing of the distal esophagus and GE junction.  GI on board unable to endoscopy as she is unstable Elevated troponin:796>769.No chest pain.Cardio has been consulted likely demand mismatch.Continue aspirin 81. Hypothyroidism:Cont  home Synthroid. Severe aortic stenosis of bioprosthetic aortic valve:followed by cardiology.  Valve placed in 2014, mean gradient 15 emergency.At risk of flask Pulm Edema.  Seen by structural heart team but anemia has complicated her work-up thus far and do not suspect at this point she would tolerate.  Hello yes Iron deficiency anemia: Hemoglobin improved to 9.5 without transfusion.  GI on board and unable to do any colonoscopy or endoscopy due to her hypotension.  Renal failure and A. fib with RVR she is on chronic iron supplementation recently added folic acid and B12 on last admission a week ago required 1 unit PRBC and Feraheme as well.  History of gastritis diverticulosis and small polyp GOC: At this time discharged home with hospice care .she has severe medical conditions and will not likely surv more than days or weeks.   Pressure Ulcer: Pressure Injury 03/19/20 Sacrum Medial Stage 2 -  Partial thickness loss of dermis presenting  as a shallow open injury with a red, pink wound bed without slough. (Active)  03/19/20 2315  Location: Sacrum  Location Orientation: Medial  Staging: Stage 2 -  Partial thickness loss of dermis presenting as a shallow open injury with a red, pink wound bed without slough.  Wound Description (Comments):   Present on Admission: Yes    Consults: Cardiology, gastroenterology, neurology, nephrology, palliative care  Subjective: Granddaughter at the bedside.  Patient appears frail ill but not in distress.  Complains of nausea " I am on my last leg"  Discharge Exam: Vitals:   03/23/20 0509 03/23/20 0927  BP: (!) 80/68 (!) 74/63  Pulse: 94 87  Resp: 20   Temp: 97.6 F (36.4 C)   SpO2: 99%    General: Pt is alert, awake, not in acute distress Cardiovascular: RRR, S1/S2 +, no rubs, no gallops Respiratory: CTA bilaterally, no wheezing, no rhonchi Abdominal: Soft, NT, ND, bowel sounds + Extremities: no edema, no cyanosis  Discharge Instructions  Discharge Instructions     Discharge wound care:   Complete by: As directed    Support dressing, off loading   Statin already ordered   Complete by: As directed       Allergies as of 03/23/2020   No Known Allergies      Medication List     STOP taking these medications    cyanocobalamin 1000 MCG  tablet   ferrous sulfate 325 (65 FE) MG EC tablet   folic acid 1 MG tablet Commonly known as: FOLVITE   multivitamin capsule   polyethylene glycol 17 g packet Commonly known as: MIRALAX / GLYCOLAX   potassium chloride 10 MEQ tablet Commonly known as: KLOR-CON       TAKE these medications    acetaminophen 325 MG tablet Commonly known as: TYLENOL Take 650 mg by mouth every 6 (six) hours as needed for headache (pain).   ALPRAZolam 0.25 MG tablet Commonly known as: XANAX Take 1 tablet (0.25 mg total) by mouth 3 (three) times daily as needed for up to 5 days for anxiety.   amiodarone 100 MG tablet Commonly known as:  PACERONE Take 1 tablet (100 mg total) by mouth daily.   aspirin EC 81 MG tablet Take 81 mg by mouth daily.   atorvastatin 20 MG tablet Commonly known as: LIPITOR Take 1 tablet (20 mg total) by mouth daily.   furosemide 40 MG tablet Commonly known as: Lasix Take 1 tablet (40 mg total) by mouth as needed for fluid (for shortness of breath). What changed:  when to take this reasons to take this   levothyroxine 100 MCG tablet Commonly known as: SYNTHROID Take 100 mcg by mouth every morning.   meclizine 12.5 MG tablet Commonly known as: ANTIVERT Take 1 tablet (12.5 mg total) by mouth 3 (three) times daily as needed for dizziness.   metoCLOPramide 5 MG tablet Commonly known as: REGLAN Take 1 tablet (5 mg total) by mouth 4 (four) times daily -  before meals and at bedtime for 7 days.   metoprolol tartrate 25 MG tablet Commonly known as: LOPRESSOR Take 0.5 tablets (12.5 mg total) by mouth 2 (two) times daily.   omeprazole 40 MG capsule Commonly known as: PRILOSEC Take 40 mg by mouth 2 (two) times daily.   ondansetron 8 MG disintegrating tablet Commonly known as: ZOFRAN-ODT Take 8 mg by mouth every 12 (twelve) hours as needed for nausea.   oxyCODONE 5 MG/5ML solution Commonly known as: ROXICODONE Take 5 mLs (5 mg total) by mouth every 6 (six) hours as needed for up to 12 doses for severe pain (dyspnea/air hunger/tachypnea).   promethazine 6.25 MG/5ML syrup Commonly known as: PHENERGAN Take 5 mLs (6.25 mg total) by mouth 4 (four) times daily as needed for up to 7 days for nausea or vomiting.   triamcinolone cream 0.1 % Commonly known as: KENALOG Apply 1 application topically See admin instructions. Apply topically to chin daily as needed for itching/rash               Discharge Care Instructions  (From admission, onward)           Start     Ordered   03/23/20 0000  Discharge wound care:       Comments: Support dressing, off loading   03/23/20 1005             Follow-up Information     Home, [facility] Follow up.   Specialty: Home Health Services [facility], [facility] Contact information: [address] [place] [address] [phone]                No Known Allergies  The results of significant diagnostics from this hospitalization (including imaging, microbiology, ancillary and laboratory) are listed below for reference.    Microbiology: Recent Results (from the past 240 hour(s))  SARS Coronavirus 2 by RT PCR (hospital  order, performed in [facility] hospital lab) Nasopharyngeal Nasopharyngeal Swab     Status: None   Collection Time: 03/19/20  7:52 PM   Specimen: Nasopharyngeal Swab  Result Value Ref Range Status   SARS Coronavirus 2 NEGATIVE NEGATIVE Final    Comment: (NOTE) SARS-CoV-2 target nucleic acids are NOT DETECTED.  The SARS-CoV-2 RNA is generally detectable in upper and lower respiratory specimens during the acute phase of infection. The lowest concentration of SARS-CoV-2 viral copies this assay can detect is 250 copies / mL. A negative result does not preclude SARS-CoV-2 infection and should not be used as the sole basis for treatment or other patient management decisions.  A negative result may occur with improper specimen collection / handling, submission of specimen other than nasopharyngeal swab, presence of viral mutation(s) within the areas targeted by this assay, and inadequate number of viral copies (<250 copies / mL). A negative result must be combined with clinical observations, patient history, and epidemiological information.  Fact Sheet for Patients:   BoilerBrush.com.cy  Fact Sheet for Healthcare Providers: https://pope.com/  This test is not yet approved or  cleared by the [place] FDA and has been authorized for detection and/or diagnosis of SARS-CoV-2 by FDA under an Emergency Use Authorization (EUA).  This EUA will  remain in effect (meaning this test can be used) for the duration of the COVID-19 declaration under Section 564(b)(1) of the Act, 21 U.S.C. section 360bbb-3(b)(1), unless the authorization is terminated or revoked sooner.  Performed at [facility] [address], [place], [place] [address]     Procedures/Studies: CT Head Wo Contrast  Result Date: 03/19/2020 CLINICAL DATA:  86 year old female recently hospitalized with CHF. Seizure last night. EXAM: CT HEAD WITHOUT CONTRAST TECHNIQUE: Contiguous axial images were obtained from the base of the skull through the vertex without intravenous contrast. COMPARISON:  None. FINDINGS: Brain: No midline shift, mass effect, or evidence of intracranial mass lesion. No acute intracranial hemorrhage identified. No ventriculomegaly. Patchy and confluent widespread cerebral white matter hypodensity, including deep white matter capsule involvement which appears greater in the left hemisphere. Comparatively mild heterogeneity in the bilateral deep gray nuclei. Small chronic appearing infarct of the right superior cerebellum. Occasional small areas of cortical encephalomalacia, including in the left middle frontal gyrus on coronal image 23. No cortically based acute infarct identified. Vascular: Calcified atherosclerosis at the skull base. Skull: No acute osseous abnormality identified. Sinuses/Orbits: Mild bubbly opacity in the right sphenoid. Other Visualized paranasal sinuses and mastoids are clear. Other: No acute orbit or scalp soft tissue finding. IMPRESSION: Advanced chronic ischemic disease with no acute intracranial abnormality by CT. Electronically Signed   By: Odessa Fleming M.D.   On: 03/19/2020 17:00   CT Angio Chest PE W/Cm &/Or Wo Cm  Result Date: 03/12/2020 CLINICAL DATA:  Weakness dizziness shortness of breath EXAM: CT ANGIOGRAPHY CHEST WITH CONTRAST TECHNIQUE: Multidetector CT imaging of the chest was performed using the standard protocol during  bolus administration of intravenous contrast. Multiplanar CT image reconstructions and MIPs were obtained to evaluate the vascular anatomy. CONTRAST:  75mL OMNIPAQUE IOHEXOL 350 MG/ML SOLN COMPARISON:  None. FINDINGS: Cardiovascular: There is a optimal opacification of the pulmonary arteries. There is no central,segmental, or subsegmental filling defects within the pulmonary arteries. There is moderate cardiomegaly. Aortic valve and coronary artery calcifications are seen. Scattered aortic atherosclerosis is noted. No pericardial effusion or thickening. No evidence right heart strain. There is normal three-vessel brachiocephalic anatomy without proximal stenosis. The thoracic  aorta is normal in appearance. Mediastinum/Nodes: No hilar, mediastinal, or axillary adenopathy. Thyroid gland, trachea, are unremarkable. There is a moderate hiatal hernia with reflux of fluid in the distal esophagus. Lungs/Pleura: Hazy ground-glass opacity seen at both lung bases with interlobular septal thickening. There is a small right and trace left pleural effusion present. No large airspace consolidation or pneumothorax. Upper Abdomen: No acute abnormalities present in the visualized portions of the upper abdomen. Musculoskeletal: No chest wall abnormality. No acute or significant osseous findings. Review of the MIP images confirms the above findings. IMPRESSION: No central, segmental, or subsegmental pulmonary embolism. Findings suggestive interstitial edema. Small right and trace left pleural effusion Aortic Atherosclerosis (ICD10-I70.0). Moderate hiatal hernia with gastroesophageal reflux Electronically Signed   By: Jonna Clark M.D.   On: 03/12/2020 21:02   MR ANGIO HEAD WO CONTRAST  Result Date: 03/20/2020 CLINICAL DATA:  Neuro deficit, acute, stroke suspected. EXAM: MRA HEAD WITHOUT CONTRAST TECHNIQUE: Angiographic images of the Circle of Willis were obtained using MRA technique without intravenous contrast. COMPARISON:  Brain  MRI 03/20/2020, head CT 03/19/2020 FINDINGS: The intracranial internal carotid arteries are patent. The M1 middle cerebral arteries are patent without significant stenosis. No M2 proximal branch occlusion or high-grade proximal stenosis is identified. The anterior cerebral arteries are patent. The visualized intracranial vertebral arteries are patent. The intracranial left vertebral artery is dominant. The basilar artery is patent. The posterior cerebral arteries are patent proximally without significant stenosis. Posterior communicating arteries are hypoplastic or absent bilaterally. No intracranial aneurysm is identified. IMPRESSION: No intracranial large vessel occlusion or proximal high-grade stenosis. Electronically Signed   By: Jackey Loge DO   On: 03/20/2020 15:40   MR ANGIO NECK WO CONTRAST  Result Date: 03/20/2020 CLINICAL DATA:  Neuro deficit, acute, stroke suspected. EXAM: MRA NECK WITHOUT CONTRAST TECHNIQUE: Angiographic images of the neck were obtained using MRA technique without intravenous contrast. Carotid stenosis measurements (when applicable) are obtained utilizing NASCET criteria, using the distal internal carotid diameter as the denominator. COMPARISON:  Noncontrast brain MRI 03/20/2020, concurrently performed MRA head 03/20/2020, head CT 03/19/2020. FINDINGS: Aberrant right subclavian artery. Motion degradation and non-contrast technique precludes adequate evaluation for stenosis within the proximal right subclavian artery. No hemodynamically significant stenosis of the proximal left subclavian artery. The bilateral common and internal carotid arteries are patent within the neck without evidence of hemodynamically significant stenosis (50% or greater). There is atherosclerotic plaque within both carotid bifurcations and proximal internal carotid arteries. Non-contrast technique and motion artifact precludes adequate evaluation of the origins of the vertebral arteries. Within this  limitation, the vertebral arteries are patent within the neck bilaterally with antegrade flow and without appreciable significant stenosis. The left vertebral artery is dominant. IMPRESSION: The bilateral common and internal carotid arteries are patent within the neck without evidence of hemodynamically significant stenosis. Atherosclerotic plaque is present within both carotid bifurcations and proximal internal carotid arteries. Non-contrast technique and motion artifact precludes adequate evaluation of the origins of the vertebral arteries. Within this limitation, the cervical vertebral arteries are patent within the neck without appreciable significant stenosis. Aberrant right subclavian artery. Electronically Signed   By: Jackey Loge DO   On: 03/20/2020 15:51   MR BRAIN WO CONTRAST  Result Date: 03/20/2020 CLINICAL DATA:  Nontraumatic seizure. Recent hypertensive episode and admission for CHF. EXAM: MRI HEAD WITHOUT CONTRAST TECHNIQUE: Multiplanar, multiecho pulse sequences of the brain and surrounding structures were obtained without intravenous contrast. COMPARISON:  Head CT from yesterday FINDINGS: Brain: Punctate acute to  subacute infarcts along the bilateral cerebral convexity and right caudate body, roughly along the cerebral watershed territories. A small more linear subacute appearing infarct is seen along the high left frontal cortex. Confluent chronic small vessel ischemic gliosis in the cerebral white matter. Small remote bilateral cerebellar infarcts. Remote cerebral hemispheric infarcts. Age normal brain volume. No acute hemorrhage, hydrocephalus, or masslike finding. There have been a few remote microhemorrhages with nonspecific pattern given the limited degree. Vascular: Normal flow voids Skull and upper cervical spine: Normal marrow signal Sinuses/Orbits: Bilateral cataract resection. Small proteinaceous nasopharyngeal cyst on the right. IMPRESSION: 1. Small acute to subacute infarcts along  the bilateral cerebral cortex, likely embolic. 2. Background of advanced chronic small vessel disease. Electronically Signed   By: Marnee Spring M.D.   On: 03/20/2020 04:16   US RENAL  Result Date: 03/21/2020 CLINICAL DATA:  Acute kidney injury EXAM: RENAL / URINARY TRACT ULTRASOUND COMPLETE COMPARISON:  MRI 07/08/2016 FINDINGS: Right Kidney: Renal measurements: 8.9 x 4.8 x 4.8 cm = volume: 105 mL. Echogenicity within normal limits. 8 mm simple cyst at the midpole. No solid mass, shadowing stone, or hydronephrosis visualized. Left Kidney: Renal measurements: 10.1 x 5.0 x 4.0 cm = volume: 106 mL. Echogenicity within normal limits. 4 mm echogenic stone within the midpole of the left kidney. 2.1 cm simple cyst at the upper pole. No solid mass or hydronephrosis visualized. Bladder: Appears normal for degree of bladder distention. Other: None. IMPRESSION: 1. No evidence of obstructive uropathy. 2. Nonobstructing 4 mm left renal stone. 3. Simple bilateral renal cysts. Electronically Signed   By: Duanne Guess D.O.   On: 03/21/2020 13:05   DG Chest Port 1 View  Result Date: 03/19/2020 CLINICAL DATA:  Low blood pressure. EXAM: PORTABLE CHEST 1 VIEW COMPARISON:  A 08/12/2019 FINDINGS: The heart size remains enlarged. The patient is status post prior median sternotomy. There are hazy bilateral airspace opacities with prominent interstitial lung markings. Aortic calcifications are again noted. There is no pneumothorax. There is no acute osseous abnormality. There may be small bilateral pleural effusions. IMPRESSION: Cardiomegaly with findings of congestive heart failure. Electronically Signed   By: Katherine Mantle M.D.   On: 03/19/2020 17:00   DG Chest Port 1 View  Result Date: 03/12/2020 CLINICAL DATA:  Weakness and shortness of breath EXAM: PORTABLE CHEST 1 VIEW COMPARISON:  06/12/2019 FINDINGS: The Chin overlies the apices. Midline trachea. Reverse apical lordotic positioning. Mild cardiomegaly. Small left  pleural effusion. No pneumothorax. Low lung volumes. Mild pulmonary interstitial prominence and indistinctness. Left greater than right base airspace disease. IMPRESSION: 1. Cardiomegaly with mild pulmonary venous congestion and small left pleural effusion. 2. Bibasilar airspace disease, atelectasis or infection. Electronically Signed   By: Jeronimo Greaves M.D.   On: 03/12/2020 17:56   EEG adult  Result Date: 03/20/2020 Charlsie Quest, MD     03/20/2020 11:02 AM Patient Name: Laurene Melendrez MRN: [id number] Epilepsy Attending: Charlsie Quest Referring Physician/Provider: Dr Odie Sera Date: 03/20/2020 Duration: 23.17 mins Patient history: 86 year old female with seizure-like activity.  EEG to evaluate for seizures. Level of alertness: Awake, asleep AEDs during EEG study: None Technical aspects: This EEG study was done with scalp electrodes positioned according to the 10-20 International system of electrode placement. Electrical activity was acquired at a sampling rate of 500Hz  and reviewed with a high frequency filter of 70Hz  and a low frequency filter of 1Hz . EEG data were recorded continuously and digitally stored. Description: The posterior dominant rhythm consists of 8-9  Hz activity of moderate voltage (25-35 uV) seen predominantly in posterior head regions, symmetric and reactive to eye opening and eye closing. Sleep was characterized by vertex waves, sleep spindles (12 to 14 Hz), maximal frontocentral region.    Hyperventilation and photic stimulation were not performed.   IMPRESSION: This study is within normal limits. No seizures or epileptiform discharges were seen throughout the recording. Charlsie Quest   ECHOCARDIOGRAM COMPLETE  Result Date: 03/13/2020    ECHOCARDIOGRAM REPORT   Patient Name:   MELINDA GWINNER Date of Exam: 03/13/2020 Medical Rec #:  [id number]      Height:       62.0 in Accession #:    [id number]     Weight:       132.2 lb Date of Birth:  08/13/1926       BSA:          1.603 m Patient  Age:    86 years       BP:           101/61 mmHg Patient Gender: F              HR:           88 bpm. Exam Location:  Inpatient Procedure: 2D Echo Indications:    acute diastolic CHF 428.31  History:        Patient has no prior history of Echocardiogram examinations.                 Risk Factors:Hypertension.  Sonographer:    Delcie Roch Referring Phys: [phone] JARED M GARDNER IMPRESSIONS  1. Left ventricular ejection fraction, by estimation, is 40 to 45%. The left ventricle has mildly decreased function. The left ventricle demonstrates global hypokinesis. There is mild concentric left ventricular hypertrophy. Left ventricular diastolic function could not be evaluated.  2. Right ventricular systolic function is mildly reduced. The right ventricular size is mildly enlarged. There is normal pulmonary artery systolic pressure.  3. Left atrial size was severely dilated.  4. Right atrial size was moderately dilated.  5. There is severe thickening and calcification of the subvalvular mitral apparatus. There is severe annular calcification. The mitral valve has both degenerative and rheumatic features. Moderate mitral valve regurgitation. Mild to moderate mitral stenosis. The mean mitral valve gradient is 6.0 mmHg with average heart rate of 88 bpm.  6. The aortic valve is tricuspid. Aortic valve regurgitation is mild to moderate. Severe aortic valve stenosis. Aortic valve Vmax measures 4.38 m/s. FINDINGS  Left Ventricle: Left ventricular ejection fraction, by estimation, is 40 to 45%. The left ventricle has mildly decreased function. The left ventricle demonstrates global hypokinesis. The left ventricular internal cavity size was normal in size. There is  mild concentric left ventricular hypertrophy. Left ventricular diastolic function could not be evaluated due to mitral stenosis. Left ventricular diastolic function could not be evaluated. Right Ventricle: The right ventricular size is mildly enlarged. No increase in  right ventricular wall thickness. Right ventricular systolic function is mildly reduced. There is normal pulmonary artery systolic pressure. The tricuspid regurgitant velocity  is 2.50 m/s, and with an assumed right atrial pressure of 8 mmHg, the estimated right ventricular systolic pressure is 33.0 mmHg. Left Atrium: Left atrial size was severely dilated. Right Atrium: Right atrial size was moderately dilated. Pericardium: There is no evidence of pericardial effusion. Mitral Valve: There is severe thickening and calcification of the subvalvular apparatus. The mitral valve is degenerative in appearance. There is moderate thickening of  the mitral valve leaflet(s). There is moderate calcification of the mitral valve leaflet(s). Severe mitral annular calcification. Moderate mitral valve regurgitation, with centrally-directed jet. Mild to moderate mitral valve stenosis. MV peak gradient, 15.1 mmHg. The mean mitral valve gradient is 6.0 mmHg with average heart rate of 88 bpm. Tricuspid Valve: The tricuspid valve is normal in structure. Tricuspid valve regurgitation is trivial. Aortic Valve: The aortic valve is tricuspid. . There is severe thickening and severe calcifcation of the aortic valve. Aortic valve regurgitation is mild to moderate. Aortic regurgitation PHT measures 304 msec. Severe aortic stenosis is present. There is  severe thickening of the aortic valve. There is severe calcifcation of the aortic valve. Aortic valve mean gradient measures 50.5 mmHg. Aortic valve peak gradient measures 76.7 mmHg. Aortic valve area, by VTI measures 0.52 cm. Pulmonic Valve: The pulmonic valve was normal in structure. Pulmonic valve regurgitation is mild. Aorta: The aortic root and ascending aorta are structurally normal, with no evidence of dilitation. IAS/Shunts: No atrial level shunt detected by color flow Doppler.  LEFT VENTRICLE PLAX 2D LVIDd:         5.10 cm  Diastology LVIDs:         4.10 cm  LV e' lateral:   6.73 cm/s LV  PW:         1.20 cm  LV E/e' lateral: 25.4 LV IVS:        1.30 cm  LV e' medial:    3.99 cm/s LVOT diam:     1.70 cm  LV E/e' medial:  42.9 LV SV:         54 LV SV Index:   34 LVOT Area:     2.27 cm  RIGHT VENTRICLE            IVC RV Basal diam:  4.66 cm    IVC diam: 2.50 cm RV S prime:     9.89 cm/s TAPSE (M-mode): 1.1 cm LEFT ATRIUM              Index       RIGHT ATRIUM           Index LA diam:        5.50 cm  3.43 cm/m  RA Area:     15.10 cm LA Vol (A2C):   114.0 ml 71.11 ml/m RA Volume:   37.20 ml  23.20 ml/m LA Vol (A4C):   65.9 ml  41.10 ml/m LA Biplane Vol: 86.2 ml  53.77 ml/m  AORTIC VALVE AV Area (Vmax):    0.48 cm AV Area (Vmean):   0.48 cm AV Area (VTI):     0.52 cm AV Vmax:           438.00 cm/s AV Vmean:          340.500 cm/s AV VTI:            1.038 m AV Peak Grad:      76.7 mmHg AV Mean Grad:      50.5 mmHg LVOT Vmax:         92.75 cm/s LVOT Vmean:        72.700 cm/s LVOT VTI:          0.237 m LVOT/AV VTI ratio: 0.23 AI PHT:            304 msec  AORTA Ao Root diam: 3.10 cm Ao Asc diam:  3.60 cm MITRAL VALVE  TRICUSPID VALVE MV Area (PHT): 4.21 cm     TR Peak grad:   25.0 mmHg MV Peak grad:  15.1 mmHg    TR Vmax:        250.00 cm/s MV Mean grad:  6.0 mmHg MV Vmax:       1.94 m/s     SHUNTS MV Vmean:      112.0 cm/s   Systemic VTI:  0.24 m MV Decel Time: 180 msec     Systemic Diam: 1.70 cm MR Peak grad: 159.3 mmHg MR Mean grad: 109.0 mmHg MR Vmax:      631.00 cm/s MR Vmean:     501.0 cm/s MV E velocity: 171.00 cm/s MV A velocity: 119.00 cm/s MV E/A ratio:  1.44 Mihai Croitoru MD Electronically signed by Thurmon Fair MD Signature Date/Time: 03/13/2020/6:03:07 PM    Final    DG ESOPHAGUS W SINGLE CM (SOL OR THIN BA)  Result Date: 03/21/2020 CLINICAL DATA:  Dysphagia, globus sensation EXAM: ESOPHOGRAM/BARIUM SWALLOW TECHNIQUE: Single contrast examination was performed using thin barium or water soluble. FLUOROSCOPY TIME:  Fluoroscopy Time:  36 seconds Radiation Exposure Index  (if provided by the fluoroscopic device): 2.9 mGy COMPARISON:  None. FINDINGS: Patient swallowed barium without difficulty. There is narrowing of the distal esophagus at the gastroesophageal junction. This is likely responsible for holdup of contrast seen throughout the study. Motility is normal. Small hiatal hernia. No definite gastroesophageal reflux. IMPRESSION: Narrowing of the distal esophagus at the gastroesophageal junction likely responsible for holdup of contrast seen throughout the study. Retained contrast in the esophagus corresponded to symptoms. Electronically Signed   By: Guadlupe Spanish M.D.   On: 03/21/2020 14:48     Labs: BNP (last 3 results) Recent Labs    06/12/19 1950 03/12/20 1749 03/19/20 1643  BNP 164.0* 3,112.4* 4,371.6*   Basic Metabolic Panel: Recent Labs  Lab 03/19/20 1644 03/20/20 0035 03/21/20 0658 03/22/20 0525 03/23/20 0553  NA 134* 139 136 138 136  K 4.7 4.4 5.2* 4.9 5.9*  CL 94* 102 99 99 97*  CO2 24 25 23 24  20*  GLUCOSE 117* 124* 130* 129* 131*  BUN 47* 42* 59* 78* 100*  CREATININE 1.82* 1.86* 2.38* 2.95* 3.82*  CALCIUM 8.8* 8.6* 8.6* 8.8* 8.0*  MG 1.7  --   --   --   --    Liver Function Tests: Recent Labs  Lab 03/19/20 1644  AST 22  ALT 8  ALKPHOS 56  BILITOT 1.2  PROT 6.7  ALBUMIN 3.1*   No results for input(s): LIPASE, AMYLASE in the last 168 hours. No results for input(s): AMMONIA in the last 168 hours. CBC: Recent Labs  Lab 03/19/20 1644 03/20/20 0035 03/21/20 0658 03/22/20 0525 03/23/20 0553  WBC 8.8 9.5 9.7 13.0* 17.2*  NEUTROABS 6.0  --   --   --   --   HGB 9.4* 8.3* 8.8* 9.4* 9.1*  HCT 30.4* 27.6* 28.4* 30.7* 30.3*  MCV 95.6 95.8 96.9 96.5 96.8  PLT 255 244 250 273 156   Cardiac Enzymes: No results for input(s): CKTOTAL, CKMB, CKMBINDEX, TROPONINI in the last 168 hours. BNP: Invalid input(s): POCBNP CBG: Recent Labs  Lab 03/21/20 1150  GLUCAP 116*   D-Dimer No results for input(s): DDIMER in the last 72  hours. Hgb A1c No results for input(s): HGBA1C in the last 72 hours. Lipid Profile No results for input(s): CHOL, HDL, LDLCALC, TRIG, CHOLHDL, LDLDIRECT in the last 72 hours. Thyroid function studies Recent Labs  03/22/20 0525  TSH 0.241*   Anemia work up No results for input(s): VITAMINB12, FOLATE, FERRITIN, TIBC, IRON, RETICCTPCT in the last 72 hours. Urinalysis    Component Value Date/Time   COLORURINE YELLOW 03/21/2020 0620   APPEARANCEUR TURBID (A) 03/21/2020 0620   LABSPEC 1.023 03/21/2020 0620   PHURINE 5.0 03/21/2020 0620   GLUCOSEU NEGATIVE 03/21/2020 0620   HGBUR NEGATIVE 03/21/2020 0620   BILIRUBINUR NEGATIVE 03/21/2020 0620   KETONESUR NEGATIVE 03/21/2020 0620   PROTEINUR 100 (A) 03/21/2020 0620   NITRITE NEGATIVE 03/21/2020 0620   LEUKOCYTESUR MODERATE (A) 03/21/2020 0620   Sepsis Labs Invalid input(s): PROCALCITONIN,  WBC,  LACTICIDVEN Microbiology Recent Results (from the past 240 hour(s))  SARS Coronavirus 2 by RT PCR (hospital order, performed in [facility] hospital lab) Nasopharyngeal Nasopharyngeal Swab     Status: None   Collection Time: 03/19/20  7:52 PM   Specimen: Nasopharyngeal Swab  Result Value Ref Range Status   SARS Coronavirus 2 NEGATIVE NEGATIVE Final    Comment: (NOTE) SARS-CoV-2 target nucleic acids are NOT DETECTED.  The SARS-CoV-2 RNA is generally detectable in upper and lower respiratory specimens during the acute phase of infection. The lowest concentration of SARS-CoV-2 viral copies this assay can detect is 250 copies / mL. A negative result does not preclude SARS-CoV-2 infection and should not be used as the sole basis for treatment or other patient management decisions.  A negative result may occur with improper specimen collection / handling, submission of specimen other than nasopharyngeal swab, presence of viral mutation(s) within the areas targeted by this assay, and inadequate number of viral copies (<250 copies / mL). A  negative result must be combined with clinical observations, patient history, and epidemiological information.  Fact Sheet for Patients:   BoilerBrush.com.cy  Fact Sheet for Healthcare Providers: https://pope.com/  This test is not yet approved or  cleared by the [place] FDA and has been authorized for detection and/or diagnosis of SARS-CoV-2 by FDA under an Emergency Use Authorization (EUA).  This EUA will remain in effect (meaning this test can be used) for the duration of the COVID-19 declaration under Section 564(b)(1) of the Act, 21 U.S.C. section 360bbb-3(b)(1), unless the authorization is terminated or revoked sooner.  Performed at [facility], [address] [place], [place] [address]      Time coordinating discharge: 35 minutes  SIGNED: Lanae Boast, MD  [facility] 03/23/2020, 3:12 PM  If 7PM-7AM, please contact night-coverage [email or web address]

## 2020-03-23 NOTE — Progress Notes (Signed)
Daily Progress Note   Patient Name: Morgan Holland       Date: 03/23/2020 DOB: November 02, 1926  Age: 86 y.o. MRN#: [id number] Attending Physician: Lanae Boast, MD Primary Care Physician: Ardyth Gal, MD Admit Date: 03/19/2020  Reason for Consultation/Follow-up: Establishing goals of care  Subjective: Patient awake, alert, oriented and able to participate in discussion. Complains of ongoing nausea poorly relieved by Zofran.   GOC: F/u GOC with patient, granddaughter [name] at bedside, and grandchildren Reita Cliche and [name] on speaker phone.   Introduced role of palliative medicine.  Discussed baseline prior to admission. Patient had COVID in November 2020. Since that time, [name] reports gradual health decline with weakness and decreased energy. She was admitted at the end of August for new onset CHF. Lives with [name]. Appetite has been poor.   Discussed in detail course of hospitalization including diagnoses, interventions, plan of care. Reviewed recommendations from all specialists including challenges with medical management due to low blood pressure and anemia.   Appreciate Dr. Signe Colt and Dr. Eden Emms at bedside during GOC discussion providing update to granddaughter. Unfortunately patients kidney function is worse, now with hyperkalemia, and decreasing urine output. Patient and grandchildren understand she is not a candidate for invasive procedures for heart or dialysis. Discussed worsening prognosis due to kidney function.   Most importantly, [name] wishes to take her grandmother home. She does not want her to die alone in the hospital. She would prefer to get her home as soon as possible.   Discussed outpatient palliative versus hospice. Discussed hospice options and philosophy, emphasizing focus  on comfort, symptom management, preventing recurrent hospitalization, and peace/dignity as she nears EOL. Grandchildren and patient would like hospice support on discharge.   Reviewed patient's thought/wishes regarding resuscitation/life support. She again is very clear on her decision against ventilator as she does not want to live like a vegetable. Discussed code status and she speaks of her desire to pass peacefully and naturally when the Shaune Pollack decides it is time. Patient decision for DNR/DNI code status. MOST form completed. Decisions include: DNR/DNI, comfort focused care and initiation of hospice on discharge, IVF for time trial, NO IVF, and NO feeding. Electronic Vynca MOST form completed. Durable DNR completed. Copies made for chart and family.   Patient understands plan for discharge home with hospice services, focus on comfort measures,  and shares her belief that this is God's decision on when she will pass. Emotional/spiritual support provided.   [name] would prefer to take her grandmother home today. Hospice agency unable to same day admit her but [name] feels confident that she can care for her grandmother over night as long as she has comfort scripts.   Symptoms discussed. Biggest complaint is nausea. Will schedule reglan. Also granddaughter reports ongoing anxiety. Low dose xanax ordered.  Answered all questions/concerns. PMT contact information given.   Discussed in detail with care team.    Length of Stay: 4  Current Medications: Scheduled Meds:  . amiodarone  100 mg Oral Daily  . aspirin EC  81 mg Oral Daily  . atorvastatin  20 mg Oral Daily  . levothyroxine  100 mcg Oral Q0600  . metoCLOPramide  5 mg Oral TID AC & HS  . metoprolol tartrate  12.5 mg Oral BID  . pantoprazole  40 mg Oral Daily  . sodium chloride flush  3 mL Intravenous Q12H    Continuous Infusions: . sodium chloride      PRN Meds: sodium chloride, acetaminophen, ALPRAZolam, atropine, ondansetron  (ZOFRAN) IV, ondansetron, oxyCODONE, polyethylene glycol, promethazine, sodium chloride flush  Physical Exam Vitals and nursing note reviewed.  Constitutional:      General: She is awake.     Appearance: She is ill-appearing.  HENT:     Head: Normocephalic and atraumatic.  Cardiovascular:     Rate and Rhythm: Normal rate.  Pulmonary:     Effort: No tachypnea, accessory muscle usage or respiratory distress.  Abdominal:     Tenderness: There is no abdominal tenderness.  Skin:    General: Skin is warm and dry.  Neurological:     Mental Status: She is alert and oriented to person, place, and time.            Vital Signs: BP (!) 74/63   Pulse 87   Temp 97.6 F (36.4 C) (Oral)   Resp 20   Ht 5\' 2"  (1.575 m)   Wt 56.2 kg   SpO2 99%   BMI 22.64 kg/m  SpO2: SpO2: 99 % O2 Device: O2 Device: Room Air O2 Flow Rate:    Intake/output summary:   Intake/Output Summary (Last 24 hours) at 03/23/2020 0954 Last data filed at 03/23/2020 0850 Gross per 24 hour  Intake 1187.91 ml  Output --  Net 1187.91 ml   LBM: Last BM Date: 03/21/20 Baseline Weight: Weight: 56.1 kg Most recent weight: Weight: 56.2 kg       Palliative Assessment/Data: PPS 40%      Patient Active Problem List   Diagnosis Date Noted  . Dysphagia   . Goals of care, counseling/discussion   . DNR (do not resuscitate) discussion   . Palliative care by specialist   . Cerebral thrombosis with cerebral infarction 03/20/2020  . Hypotension   . Acute on chronic systolic CHF (congestive heart failure) (HCC) 03/19/2020  . Acute renal failure superimposed on stage 3 chronic kidney disease (HCC) 03/19/2020  . Elevated troponin 03/19/2020  . Observed seizure-like activity (HCC) 03/19/2020  . 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 Iron deficiency anemia 03/16/2020  . Mitral regurgitation and aortic stenosis 03/16/2020  . Acute combined systolic and diastolic heart failure (HCC)   . Aortic valve replaced   . Severe  aortic stenosis   . Chronic blood loss anemia 03/13/2020  . Chronic GI bleeding 03/13/2020  . Pressure injury of skin 06/13/2019  . Hypothyroidism 06/12/2019  .  Hypertension   . Bradycardia     Palliative Care Assessment & Plan   Patient Profile: 86 y.o. female  with past medical history of systolic CHF EF 40-45%, severe aortic stenosis, s/p AVR 2014, chronic kidney disease, chronic anemia, hypothyroidism, hypertension, COVID infection December 2020 admitted on 03/19/2020 with low blood pressure and seizure-like episode as welll as acute CHF exacerbation and acute on chronic kidney CKD 3b. Recent admission for new CHF diagnosis. Now with severe prosthetic aortic valve stenosis as well as symptomatic anemia. MRI with small acute to subacute bilateral infarcts likely embolic secondary to aortic stenosis vs significant hypokinesis.   Assessment: Acute on chronic systolic CHF EF 40-45% AKI on CKD stage IIIb Severe prosthetic aortic value stenosis Symptomatic anemia Small bilateral acute to subacute infarcts Dysphagia  Hypotension IDA  Recommendations/Plan:  Extensive GOC discussion with patient, granddaughter [name] at bedside, and grandchildren Reita Cliche and [place] on speaker.   Patient/family understand diagnoses, interventions, and unfortunate poor prognosis.   They wish to discharge her home with support from hospice as soon as possible. TOC team notified. NO DME needs. Granddaughter will need comfort scripts on discharge. Notified attending.  Symptom management  Reglan 5mg  PO TID before meals and HS  PRN Zofran and Phenergan nausea  Xanax 0.25mg  PO TID prn anxiety  Oxycodone 5mg  PO q6h prn severe pain/dyspnea/air hunger/tachypnea  Miralax daily prn constipation  Atropine SL QID prn secretions  MOST form completed. Decisions include: DNR/DNI, comfort focused care with initiation of hospice on discharge, ABX for time trial if indicated, NO IVF, NO feeding tube. Vynca  electronic MOST completed. Durable DNR completed. Copies made for chart and family.   Hopefully home with hospice services this afternoon.   Code Status: DNR/DNI   Code Status Orders  (From admission, onward)         Start     Ordered   03/19/20 2322  Full code  Continuous        03/19/20 2323        Code Status History    Date Active Date Inactive Code Status Order ID Comments User Context   03/13/2020 0208 03/16/2020 2027 Full Code 05/16/2020  2028, DO Inpatient   06/12/2019 1805 06/16/2019 1534 Full Code 06/14/2019  14/10/2018, MD ED   Advance Care Planning Activity       Prognosis:   Poor prognosis likely weeks if not days with worsening kidney function, hyperkalemia, poor urine output and multiple underlying co-morbidities including heart failure and severe aortic stenosis.  Discharge Planning:  Home with Hospice  Care plan was discussed with patient, granddaughter [phone]) at bedside, grandchildren Ollen Bowl and [place] on speaker phone), RN, Dr. Reita Cliche, Dr. [place], Dr Jonathon Bellows, SW, hospice liaisons  Thank you for allowing the Palliative Medicine Team to assist in the care of this patient.   Total Time 100 Prolonged Time Billed yes    Greater than 50% of this time was spent counseling and coordinating care related to the above assessment and plan.   Eden Emms, DNP, FNP-C Palliative Medicine Team  Phone: [phone] Fax: [phone]  Please contact Palliative Medicine Team phone at [phone] for questions and concerns.

## 2020-03-23 NOTE — Progress Notes (Signed)
Administrator, sports received referral for pt to discharge home with hospice services.   Called granddaughter, [name], with no answer. Left VM to return call ASAP so that services can be established.   Please do not hesitate to outreach with any questions and thank you for the referral.   Sincerely,  Trena Platt, RN  [facility] Liaison [phone]

## 2020-03-23 NOTE — Progress Notes (Signed)
STROKE TEAM PROGRESS NOTE    Interval History   Patient is sitting up in bed.  She looks comfortable.  She has poor insight into her illness.  She is pleasant and cooperative.  Her daughter is at the bedside.  Family has decided on palliative care patient is going home today.  Vital signs are stable.   Pertinent Lab Work and Imaging    03/13/20 Echocardiogram Complete  1. Left ventricular ejection fraction, by estimation, is 40 to 45%. The left ventricle has mildly decreased function. The left ventricle demonstrates global hypokinesis. There is mild concentric left ventricular  hypertrophy. Left ventricular diastolic function could not be evaluated.  2. Right ventricular systolic function is mildly reduced. The right ventricular size is mildly enlarged. There is normal pulmonary artery systolic pressure.  3. Left atrial size was severely dilated.  4. Right atrial size was moderately dilated.  5. There is severe thickening and calcification of the subvalvular mitral apparatus.There is severe annular calcification. The mitral valve has both degenerative and rheumatic features. Moderate mitral valve regurgitation. Mild to moderate mitral stenosis. The mean mitral valve gradient is 6.0 mmHg with average heart rate of 88 bpm.  6. The aortic valve is tricuspid. Aortic valve regurgitation is mild to moderate. Severe aortic valve stenosis. Aortic valve Vmax measures 4.38 m/s.   03/19/20 CT Head WO IV Contrast Advanced chronic ischemic disease with no acute intracranial abnormality by CT.  03/20/20 MR Angio Neck WO Contrast  The bilateral common and internal carotid arteries are patent within the neck without evidence of hemodynamically significant stenosis. Atherosclerotic plaque is present within both carotid bifurcations and proximal internal carotid arteries. Non-contrast technique and motion artifact precludes adequate evaluation of the origins of the vertebral arteries. Within this limitation,  the cervical vertebral arteries are patent within the neck without appreciable significant stenosis. Aberrant right subclavian artery.  03/20/20 MR Angio Head WO Contrast  The intracranial internal carotid arteries are patent. The M1 middle cerebral arteries are patent without significant stenosis. No M2 proximal branch occlusion or high-grade proximal stenosis is identified. The anterior cerebral arteries are patent.  The visualized intracranial vertebral arteries are patent. The intracranial left vertebral artery is dominant. The basilar artery is patent. The posterior cerebral arteries are patent proximally without significant stenosis. Posterior communicating arteries are hypoplastic or absent bilaterally.  No intracranial aneurysm is identified.  03/20/20 MRI Brain WO IV Contrast 1. Small acute to subacute infarcts along the bilateral cerebral cortex, likely embolic. 2. Background of advanced chronic small vessel disease.    Physical Examination   Pleasant elderly Caucasian lady not in distress.  Temp:  [97.5 F (36.4 C)-98.8 F (37.1 C)] 97.6 F (36.4 C) (09/11 0509) Pulse Rate:  [51-166] 94 (09/11 0509) Resp:  [18-20] 20 (09/11 0509) BP: (67-87)/(48-69) 80/68 (09/11 0509) SpO2:  [95 %-99 %] 99 % (09/11 0509) Weight:  [56.2 kg] 56.2 kg (09/11 0117)  General - Well nourished, well developed, in no apparent distress.  Ophthalmologic - fundi not visualized due to noncooperation.  Cardiovascular - irregularly irregular heart rate and rhythm.  Neuro - patient lying in bed, awake alert, interactive but, hard of hearing, however orientated to age, people, and months, but not orientated to year.  Mild left nasolabial fold flattening, however visual field full, no gaze deviation, tongue midline, PERRL, EOMI.  Moving all extremities symmetrically and equally.  Sensation symmetrical, finger-to-nose grossly intact.  Gait not tested.   Assessment and Plan   Ms. Morgan Holland is a  86  y.o. female w/pmh of HTN,HLD, BPPV, hypothyroidism, GERD, chronic GI bleeding, iron deficiency anemia, atrial valve replacement, myocardial infarct, severe aortic stenosis status post AVR now with severe aortic valve stenosis, recently diagnosed with congestive heart failure during her last admission at [facility] (03/12/20-03/17/20) who presented with hypotension w/SBP drop to the 80's and seizure like activity. She was preparing to eat lunch on 03/18/20 when she began to have shaking motions that lasted for 15-20 s and was not responding during this time. Afterwards she was somnolent. Due to this activity, an MRI of the Brain was obtained that showed small acute to subacute infarcts along the bilateral cerebral cortex. Of note, she was outside the time window for IVTPA and was not a candidate for mechanical thrombectomy given low NIHSS.  #Small Punctate Stroke Along the Bilateral Cerebral Convexity and Right Caudate Body - likely hypotension with watershed distribution vs. cardioemoblic from PAF  MR Angio Head and Neck did not reveal significant stenosis, did show atherosclerotic plaque present within both carotid bifurcations.   An echocardiogram was not repeated given she recently had one done on 03/13/20 that revealed an EF of 40 to 45 %, global hypokinesis of the left ventricle LVH and severe aortic valve stenosis.   LDL 75 and Hemoglobin A1C 4.8.   Agree with cardiology, GI and nephrology, pt not good candidate for anticoagulation at this time.   Now on ASA 81mg . Continue if OK with GI and cardiology  Palliative care has been consulted.  #Newly diagnosed HF  #Newly diangosed AF RVR - not a candidate for anticoagulation 2/2 anemia #Hypoetnsion  #Severe aortic stenosis  Per granddaughter, hypotension at home after last discharge  Currently her SBP is trending in the upper 70s-90 range.   On metoprolol now - close monitor BP  On amiodarone drip  Cardiology on board  Palliative care on  board  #Seizure like Activity  She presented with seizure like activity noted by family. A routine EEG was obtained that did not show an epileptiform discharges. It is possible that her strokes, which are cortical caused her to seize. Will defer on AEDs at this time given first time seizure.   #Hyperlipidemia From a stroke prevention stand point, the LDL goal is < 70. Her LDL is 75. She was placed on Atorvastatin 20 mg this admission and is to continue this medication at discharge.  #Iron Deficiency Anemia   Hemoglobin 8.8->9.4->9.1  During her prior admission, her iron, folate and B12 were assessed, with all found to be low. She was given 1 unit of PRBC and  Folate + B12 supplements were initiated.  GI on board and are recommending a BA Esophagram + possible EGD.   EGD not able to perform due to hypotension and afib RVR  On ASA now  # AKI on CKD  Cre 1.86-2.38-2.95->3.82  Nephrology on board  Not a candidate for dialysis  Palliative care on board - Family to meet with MDs today.  Leukocytosis    9.7->13.0->17.2   urine culture - multiple species   blood cultures - no growth x 5 days  temp - 97.6  CXR 9/7 - Cardiomegaly with findings of congestive heart failure.  Hospital day # 4  Patient developed by cerebral tiny embolic infarcts likely from A. fib but has multiple ongoing health problems particularly severe aortic stenosis and CHF and hypertension.  Family is elected for palliative care approach only and patient is being discharged home.  Discussed with patient and daughter and  Dr.Kc Ramesh remission answered questions.  Greater than 50% time during this 25-minute visit was spent in counseling and coordination of care and discussion with care team.  Stroke team will sign off. Kindly call for questions.  Delia Heady, MD    To contact Stroke Continuity provider, please refer to [email or web address]. After hours, contact General Neurology

## 2020-03-23 NOTE — Progress Notes (Signed)
Progress Note  Patient Name: Morgan Holland Date of Encounter: 03/23/2020  [facility] Cardiologist: Chilton Si, MD   Subjective   Grand daughter at bedside as is palliative nurse Primary concern by patient is nausea   Inpatient Medications    Scheduled Meds: . amiodarone  100 mg Oral Daily  . aspirin EC  81 mg Oral Daily  . atorvastatin  20 mg Oral Daily  . levothyroxine  100 mcg Oral Q0600  . metoCLOPramide  5 mg Oral TID AC & HS  . metoprolol tartrate  12.5 mg Oral BID  . pantoprazole  40 mg Oral Daily  . sodium chloride flush  3 mL Intravenous Q12H  . sodium zirconium cyclosilicate  10 g Oral Once   Continuous Infusions: . sodium chloride     PRN Meds: sodium chloride, acetaminophen, ALPRAZolam, atropine, ondansetron (ZOFRAN) IV, ondansetron, oxyCODONE, polyethylene glycol, promethazine, sodium chloride flush   Vital Signs    Vitals:   03/23/20 0034 03/23/20 0117 03/23/20 0509 03/23/20 0927  BP: (!) 83/68  (!) 80/68 (!) 74/63  Pulse: (!) 102  94 87  Resp: 18  20   Temp: 97.7 F (36.5 C)  97.6 F (36.4 C)   TempSrc: Oral  Oral   SpO2: 97%  99%   Weight:  56.2 kg    Height:        Intake/Output Summary (Last 24 hours) at 03/23/2020 0932 Last data filed at 03/23/2020 0850 Gross per 24 hour  Intake 1187.91 ml  Output --  Net 1187.91 ml   Last 3 Weights 03/23/2020 03/22/2020 03/21/2020  Weight (lbs) 123 lb 12.8 oz 122 lb 14.4 oz 122 lb 6.4 oz  Weight (kg) 56.155 kg 55.747 kg 55.52 kg      Telemetry    Converted to Afib RVR in the 140s at 0425 - Personally Reviewed  ECG    Atrial fibrillation with ventricular rate 136 septal Q waves, ST depression lateral leads - Personally Reviewed  Physical Exam   GEN: No acute distress.   Neck: + JVD Cardiac: irregular rhythm tachycardic rate, + 4/6 murmur Respiratory: scattered crackles in bases GI: Soft, nontender, non-distended  MS: 1+ B LE edema Neuro:  Nonfocal  Psych: Normal affect   Labs     High Sensitivity Troponin:   Recent Labs  Lab 03/12/20 1749 03/13/20 0508 03/13/20 0719 03/19/20 2032 03/20/20 0035  TROPONINIHS 176* 169* 182* 796* 769*      Chemistry Recent Labs  Lab 03/19/20 1644 03/20/20 0035 03/21/20 0658 03/22/20 0525 03/23/20 0553  NA 134*   < > 136 138 136  K 4.7   < > 5.2* 4.9 5.9*  CL 94*   < > 99 99 97*  CO2 24   < > 23 24 20*  GLUCOSE 117*   < > 130* 129* 131*  BUN 47*   < > 59* 78* 100*  CREATININE 1.82*   < > 2.38* 2.95* 3.82*  CALCIUM 8.8*   < > 8.6* 8.8* 8.0*  PROT 6.7  --   --   --   --   ALBUMIN 3.1*  --   --   --   --   AST 22  --   --   --   --   ALT 8  --   --   --   --   ALKPHOS 56  --   --   --   --   BILITOT 1.2  --   --   --   --  GFRNONAA 24*   < > 17* 13* 10*  GFRAA 28*   < > 20* 15* 11*  ANIONGAP 16*   < > 14 15 19*   < > = values in this interval not displayed.     Hematology Recent Labs  Lab 03/21/20 0658 03/22/20 0525 03/23/20 0553  WBC 9.7 13.0* 17.2*  RBC 2.93* 3.18* 3.13*  HGB 8.8* 9.4* 9.1*  HCT 28.4* 30.7* 30.3*  MCV 96.9 96.5 96.8  MCH 30.0 29.6 29.1  MCHC 31.0 30.6 30.0  RDW 18.2* 18.5* 18.9*  PLT 250 273 156    BNP Recent Labs  Lab 03/19/20 1643  BNP 4,371.6*     DDimer No results for input(s): DDIMER in the last 168 hours.   Radiology    US RENAL  Result Date: 03/21/2020 CLINICAL DATA:  Acute kidney injury EXAM: RENAL / URINARY TRACT ULTRASOUND COMPLETE COMPARISON:  MRI 07/08/2016 FINDINGS: Right Kidney: Renal measurements: 8.9 x 4.8 x 4.8 cm = volume: 105 mL. Echogenicity within normal limits. 8 mm simple cyst at the midpole. No solid mass, shadowing stone, or hydronephrosis visualized. Left Kidney: Renal measurements: 10.1 x 5.0 x 4.0 cm = volume: 106 mL. Echogenicity within normal limits. 4 mm echogenic stone within the midpole of the left kidney. 2.1 cm simple cyst at the upper pole. No solid mass or hydronephrosis visualized. Bladder: Appears normal for degree of bladder distention.  Other: None. IMPRESSION: 1. No evidence of obstructive uropathy. 2. Nonobstructing 4 mm left renal stone. 3. Simple bilateral renal cysts. Electronically Signed   By: Duanne Guess D.O.   On: 03/21/2020 13:05   DG ESOPHAGUS W SINGLE CM (SOL OR THIN BA)  Result Date: 03/21/2020 CLINICAL DATA:  Dysphagia, globus sensation EXAM: ESOPHOGRAM/BARIUM SWALLOW TECHNIQUE: Single contrast examination was performed using thin barium or water soluble. FLUOROSCOPY TIME:  Fluoroscopy Time:  36 seconds Radiation Exposure Index (if provided by the fluoroscopic device): 2.9 mGy COMPARISON:  None. FINDINGS: Patient swallowed barium without difficulty. There is narrowing of the distal esophagus at the gastroesophageal junction. This is likely responsible for holdup of contrast seen throughout the study. Motility is normal. Small hiatal hernia. No definite gastroesophageal reflux. IMPRESSION: Narrowing of the distal esophagus at the gastroesophageal junction likely responsible for holdup of contrast seen throughout the study. Retained contrast in the esophagus corresponded to symptoms. Electronically Signed   By: Guadlupe Spanish M.D.   On: 03/21/2020 14:48    Cardiac Studies   Echo 03/13/20: 1. Left ventricular ejection fraction, by estimation, is 40 to 45%. The  left ventricle has mildly decreased function. The left ventricle  demonstrates global hypokinesis. There is mild concentric left ventricular  hypertrophy. Left ventricular diastolic  function could not be evaluated.  2. Right ventricular systolic function is mildly reduced. The right  ventricular size is mildly enlarged. There is normal pulmonary artery  systolic pressure.  3. Left atrial size was severely dilated.  4. Right atrial size was moderately dilated.  5. There is severe thickening and calcification of the subvalvular mitral  apparatus. There is severe annular calcification. The mitral valve has  both degenerative and rheumatic features.  Moderate mitral valve  regurgitation. Mild to moderate mitral  stenosis. The mean mitral valve gradient is 6.0 mmHg with average heart  rate of 88 bpm.  6. The aortic valve is tricuspid. Aortic valve regurgitation is mild to  moderate. Severe aortic valve stenosis. Aortic valve Vmax measures 4.38  m/s.   Patient Profile  86 y.o. female with severe bioprosthetic aortic valve stenosis, SVT, hypertension, and hypothyroidism. Admitted with evidence of embolic stroke and anemia. She converted to Afib RVR 0430 03/22/20.  Assessment & Plan    New onset atrial fibrillation with RVR - rate control is fine - not a candidate for anticoagulation  - d/c iv amiodarone change to PO   Severe stenosis of bioprosthetic aortic valve Mitral stenosis - valve placed in 2014 - mean gradient 50 mmHg - was evaluated by structural heart team, but anemia has complicated her workup thusfar - do not suspect at this point she would tolerate    Symptomatic Anemia - unclear etiology - GI consulted for possible upper endoscopy tentatively scheduled for today, but now in Afib RVR - Hb today is 9.1     Acute on chronic renal insufficiency stage III - Cr 3.82 today    Acute on chronic systolic heart failure - likely diastolic dysfunction - EF 40-45% - mildly volume up, but appears similar to the past several days   Palliative care following plan to d/c home to palliative/hospice today  Grand daughter in agreement   Cardiology will sign off       For questions or updates, please contact [facility] Please consult [email or web address] for contact info under        Signed, Charlton Haws, MD  03/23/2020, 9:32 AM    Patient seen and examined with Bettina Gavia PA.  Agree as above, with the following exceptions and changes as noted below. She has weakness and nausea, and feels off in her chest. We discussed implications on continued hypotension. Gen: appears fatigued, CV: irregular tachycardia Lungs:  clear, Abd: soft, Extrem: Warm, well perfused, no edema, Neuro/Psych: alert and oriented x 3, normal mood and affect. All available labs, radiology testing, previous records reviewed.  It is not surprising that she has atrial fibrillation, currently has A. fib with rapid ventricular response.  Challenging situation given hypotension, this will complicate rate control.  We will initiate IV amiodarone, understanding that she is not on anticoagulation.  Need for rate control without significant impact on the blood pressure, unable to use beta-blockade or CCB at this time.  We will continue IV amiodarone today.  I discussed his very complex situation with the patient in frank terms.  Have also discussed her care with nephrology.  We have agreed that avoiding prolonged hypotension would be optimal.  Hypotension will be difficult to manage if atrial fibrillation rates remain elevated.  Agree with GI that endoscopy would be helpful to better understand the source of her anemia.  At this time with hypotension and tachycardia, it may be difficult to perform endoscopy.  If her rates are better this afternoon or she converts to sinus rhythm, could still consider.  Charlton Haws, MD 03/23/20 9:32 AM

## 2020-03-23 NOTE — Progress Notes (Signed)
[facility] Progress Note    Assessment/ Plan:   1.  AKI on CKD 3b: CKD 3b likely d/t HTN and natural aging.  AKI likely hypotension-mediated especially now that we know she has Afib with RVR in the setting of low BP.  renal US unremarkable, UA some WBCs but few bacteria.  She is a very poor candidate for dialysis which we discussed with pt and granddaughter  I also told her that we may be at a point where none of this is fixable.  Given the fact that her Cr is rising and her K is up without UOP, I estimate prognosis in days.  D/w family this AM.  She is to go home with hospice.  Greatly appreciate palliative care.  2.  Hyperkalemia: 10 g Lokelma today  3.  Chronic systolic CHF EF 40-45%: newly diagnosed, holding metoprolol and Lasix for now  4.  Bilateral cortical infarcts: neuro following  5.  Seizure- like activity- seizure precautions, neuro following  6.  Anemia: GI has been consulted, appreciate assistance  7.  Afib with RVR: noted here for the first time, likely source of strokes and responsible for AKI 2/2 decreased effective arterial blood flow  8.  Dispo:  Home with hospice.  Will sign off.  Call with questions.   Subjective:    No UOP overnight.  Cr 3.8, K 5.9. Discussed prognosis with pt's granddaughter [name] yesterday.  Palliative care in the room today.  Appreciate assistance.  Plan to go home with hospice.     Objective:   BP (!) 74/63   Pulse 87   Temp 97.6 F (36.4 C) (Oral)   Resp 20   Ht 5\' 2"  (1.575 m)   Wt 56.2 kg   SpO2 99%   BMI 22.64 kg/m   Intake/Output Summary (Last 24 hours) at 03/23/2020 1008 Last data filed at 03/23/2020 0850 Gross per 24 hour  Intake 1087.91 ml  Output --  Net 1087.91 ml   Weight change: 0.408 kg  Physical Exam: GEN NAD, sitting in bed HEENT EOMI PERRL PULM normal WOB, faint bibasilar crackles CV tachycardic, irregular ABD soft, nontender EXT trace LE edema NEURO AAO x 3 nonfocal SKIN no rashes  or lesions  Imaging: 05/23/2020 RENAL  Result Date: 03/21/2020 CLINICAL DATA:  Acute kidney injury EXAM: RENAL / URINARY TRACT ULTRASOUND COMPLETE COMPARISON:  MRI 07/08/2016 FINDINGS: Right Kidney: Renal measurements: 8.9 x 4.8 x 4.8 cm = volume: 105 mL. Echogenicity within normal limits. 8 mm simple cyst at the midpole. No solid mass, shadowing stone, or hydronephrosis visualized. Left Kidney: Renal measurements: 10.1 x 5.0 x 4.0 cm = volume: 106 mL. Echogenicity within normal limits. 4 mm echogenic stone within the midpole of the left kidney. 2.1 cm simple cyst at the upper pole. No solid mass or hydronephrosis visualized. Bladder: Appears normal for degree of bladder distention. Other: None. IMPRESSION: 1. No evidence of obstructive uropathy. 2. Nonobstructing 4 mm left renal stone. 3. Simple bilateral renal cysts. Electronically Signed   By: 07/10/2016 D.O.   On: 03/21/2020 13:05   DG ESOPHAGUS W SINGLE CM (SOL OR THIN BA)  Result Date: 03/21/2020 CLINICAL DATA:  Dysphagia, globus sensation EXAM: ESOPHOGRAM/BARIUM SWALLOW TECHNIQUE: Single contrast examination was performed using thin barium or water soluble. FLUOROSCOPY TIME:  Fluoroscopy Time:  36 seconds Radiation Exposure Index (if provided by the fluoroscopic device): 2.9 mGy COMPARISON:  None. FINDINGS: Patient swallowed barium without difficulty. There is narrowing of the distal esophagus at  the gastroesophageal junction. This is likely responsible for holdup of contrast seen throughout the study. Motility is normal. Small hiatal hernia. No definite gastroesophageal reflux. IMPRESSION: Narrowing of the distal esophagus at the gastroesophageal junction likely responsible for holdup of contrast seen throughout the study. Retained contrast in the esophagus corresponded to symptoms. Electronically Signed   By: Guadlupe Spanish M.D.   On: 03/21/2020 14:48    Labs: BMET Recent Labs  Lab 03/19/20 1644 03/20/20 0035 03/21/20 0658 03/22/20 0525  03/23/20 0553  NA 134* 139 136 138 136  K 4.7 4.4 5.2* 4.9 5.9*  CL 94* 102 99 99 97*  CO2 24 25 23 24  20*  GLUCOSE 117* 124* 130* 129* 131*  BUN 47* 42* 59* 78* 100*  CREATININE 1.82* 1.86* 2.38* 2.95* 3.82*  CALCIUM 8.8* 8.6* 8.6* 8.8* 8.0*   CBC Recent Labs  Lab 03/19/20 1644 03/19/20 1644 03/20/20 0035 03/21/20 0658 03/22/20 0525 03/23/20 0553  WBC 8.8   < > 9.5 9.7 13.0* 17.2*  NEUTROABS 6.0  --   --   --   --   --   HGB 9.4*   < > 8.3* 8.8* 9.4* 9.1*  HCT 30.4*   < > 27.6* 28.4* 30.7* 30.3*  MCV 95.6   < > 95.8 96.9 96.5 96.8  PLT 255   < > 244 250 273 156   < > = values in this interval not displayed.    Medications:    . amiodarone  100 mg Oral Daily  . aspirin EC  81 mg Oral Daily  . atorvastatin  20 mg Oral Daily  . levothyroxine  100 mcg Oral Q0600  . metoCLOPramide  5 mg Oral TID AC & HS  . metoprolol tartrate  12.5 mg Oral BID  . pantoprazole  40 mg Oral Daily  . sodium chloride flush  3 mL Intravenous Q12H      05/23/20 MD 03/23/2020, 10:08 AM

## 2020-04-03 ENCOUNTER — Ambulatory Visit: Payer: Medicare Other | Admitting: Physician Assistant

## 2020-04-12 DEATH — deceased

## 2022-05-17 IMAGING — CT CT HEAD W/O CM
3 series · 14 of 47 positions shown, 16 images · non-contrast
Comparison: None.

CLINICAL DATA: [AGE] female recently hospitalized with CHF.
Seizure last night.

EXAM:
CT HEAD WITHOUT CONTRAST
TECHNIQUE: Contiguous axial images were obtained from the base of the skull
through the vertex without intravenous contrast.

[Series 3: head wo · axial · 0.38mm/px · z∈[-172,-48]mm · 8 of 30 slices shown, 10 images]
[im 3/30  brain]
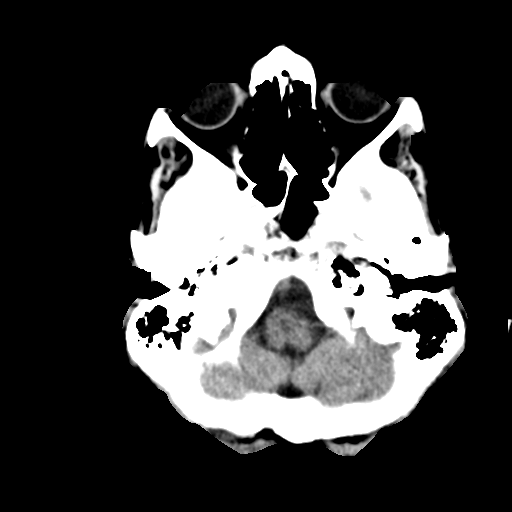
[im 3/30  bone]
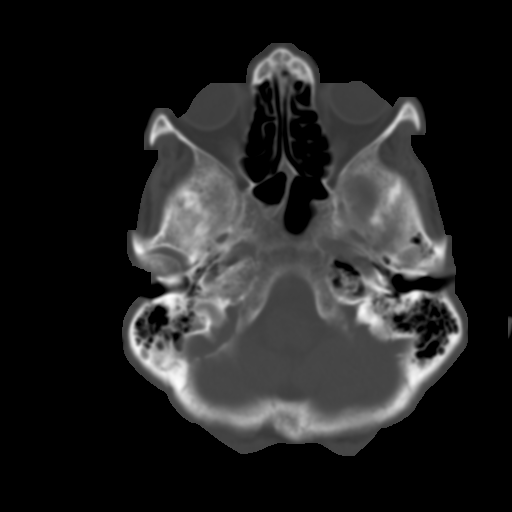
[im 7/30  brain]
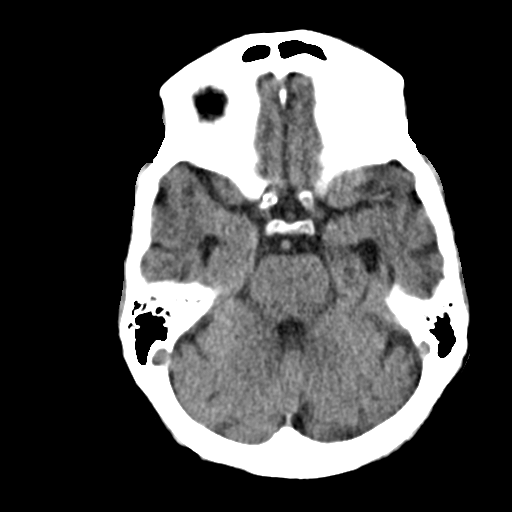
[im 10/30  brain]
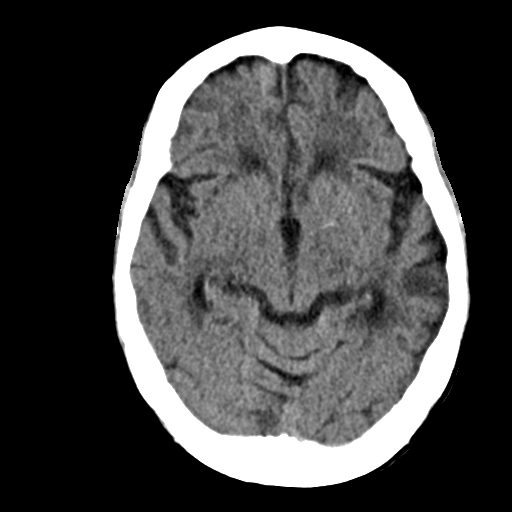
[im 14/30  brain]
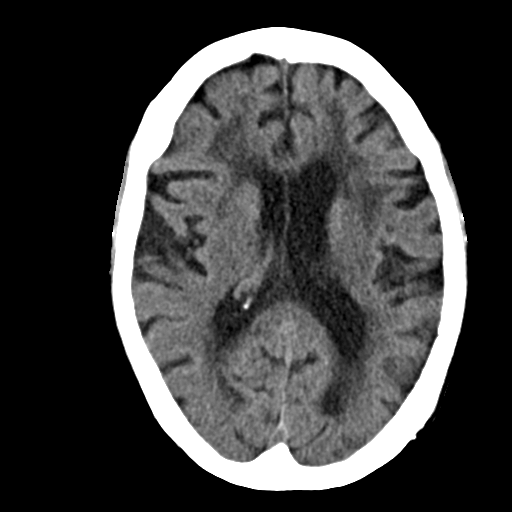
[im 17/30  brain]
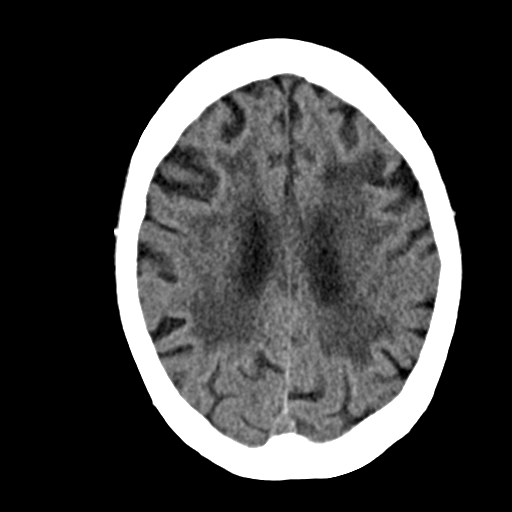
[im 17/30  bone]
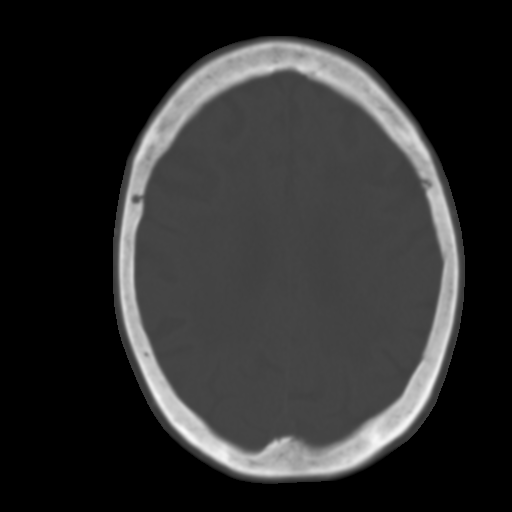
[im 21/30  brain]
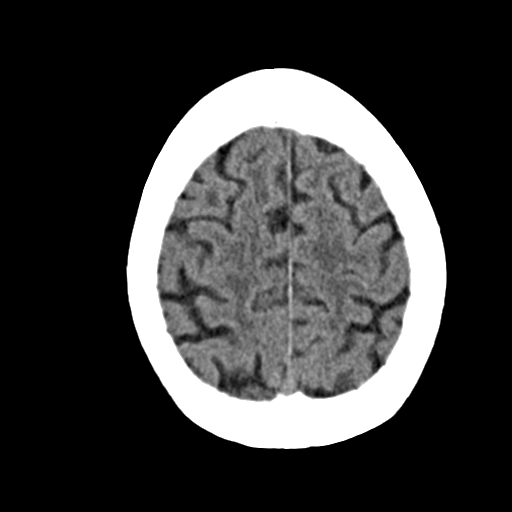
[im 24/30  brain]
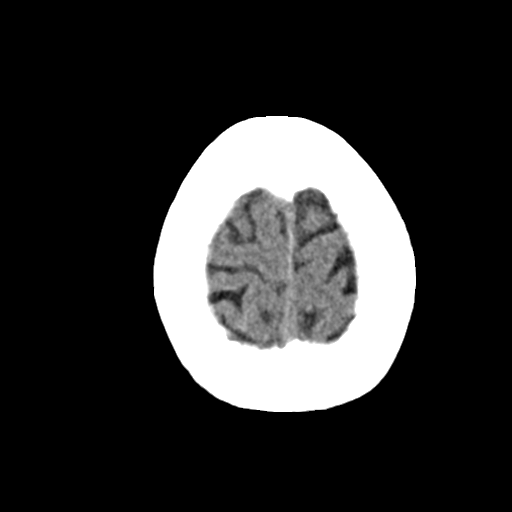
[im 28/30  brain]
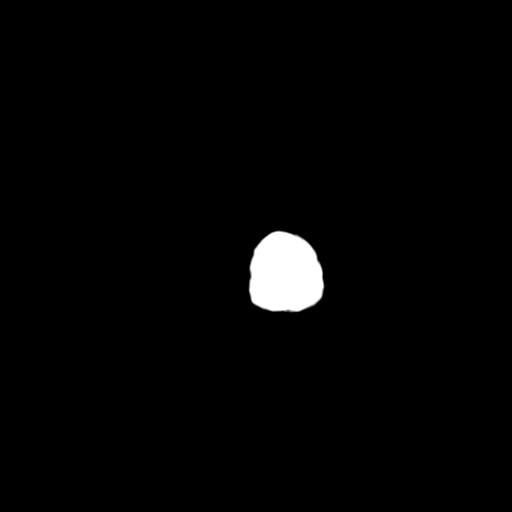

[Series 5: coronal soft · coronal · 0.29mm/px · 3 of 62 slices shown]
[im 21/62  brain]
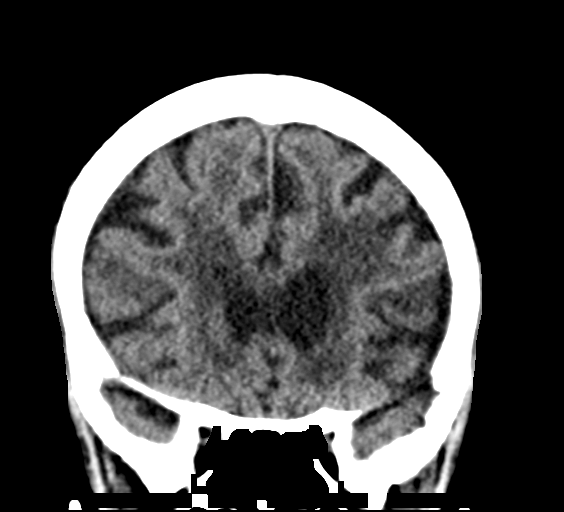
[im 28/62  brain]
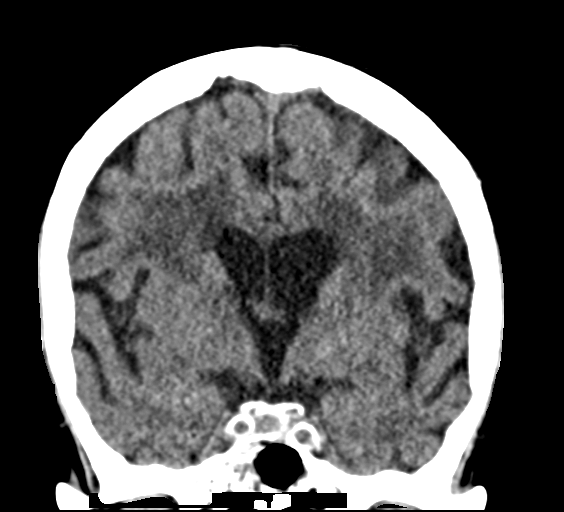
[im 34/62  brain]
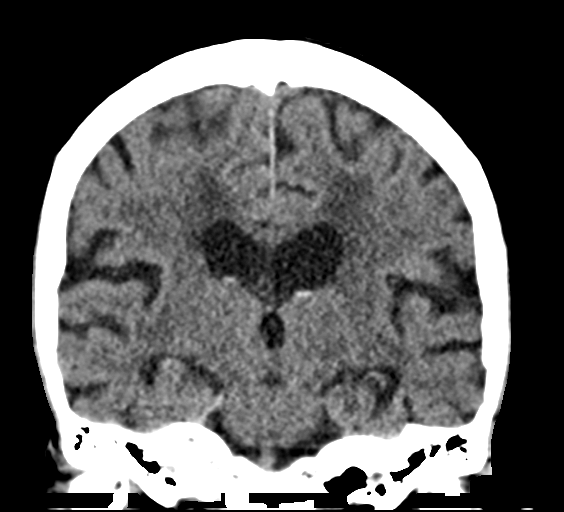

[Series 6: sag soft · sagittal · 0.29mm/px · 3 of 50 slices shown]
[im 17/50  brain]
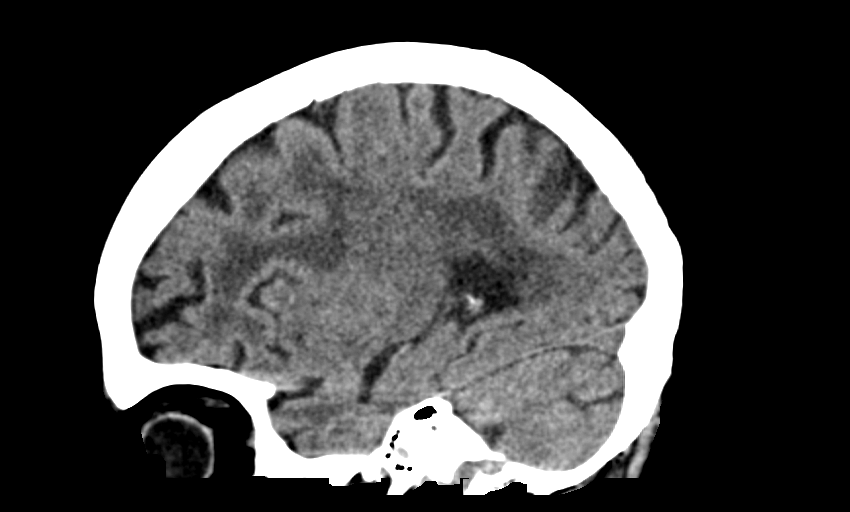
[im 25/50  brain]
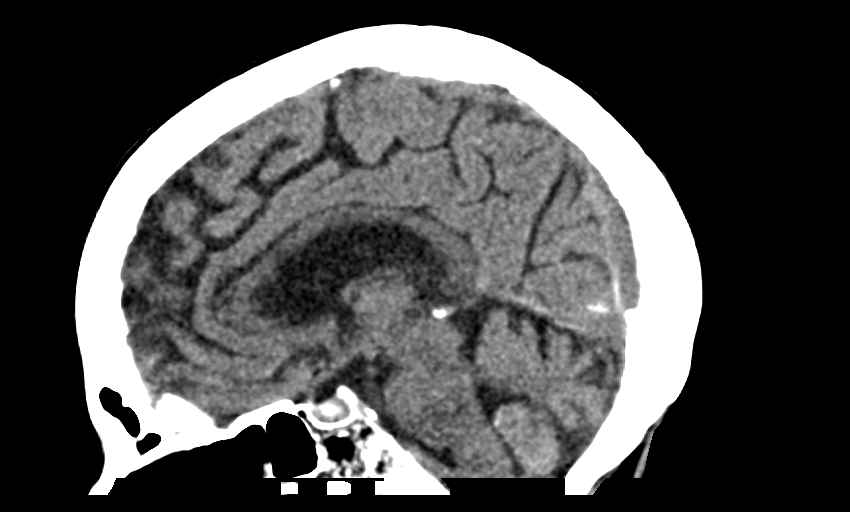
[im 33/50  brain]
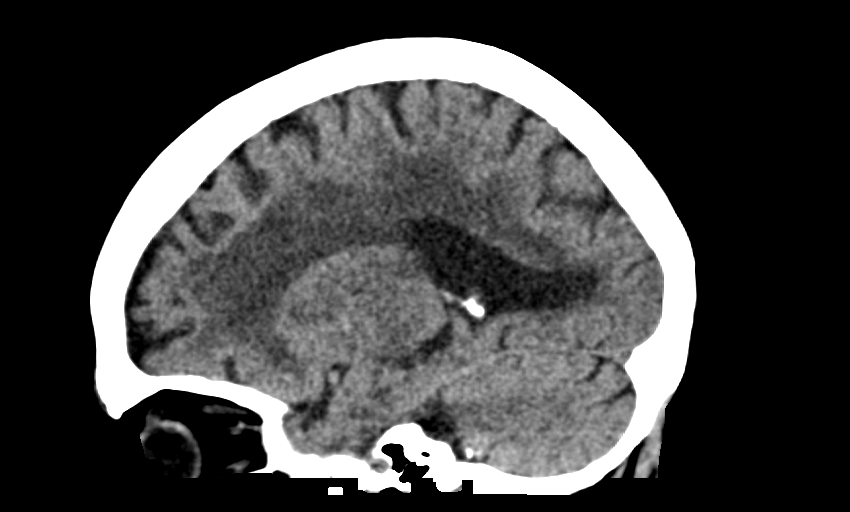

[14 of 47 positions shown; findings below may reference images not displayed]

FINDINGS: Brain: No midline shift, mass effect, or evidence of intracranial
mass lesion. No acute intracranial hemorrhage identified. No
ventriculomegaly.

Patchy and confluent widespread cerebral white matter hypodensity,
including deep white matter capsule involvement which appears
greater in the left hemisphere. Comparatively mild heterogeneity in
the bilateral deep gray nuclei. Small chronic appearing infarct of
the right superior cerebellum. Occasional small areas of cortical
encephalomalacia, including in the left middle frontal gyrus on
coronal image 23. No cortically based acute infarct identified.

Vascular: Calcified atherosclerosis at the skull base.

Skull: No acute osseous abnormality identified.

Sinuses/Orbits: Mild bubbly opacity in the right sphenoid. Other
Visualized paranasal sinuses and mastoids are clear.

Other: No acute orbit or scalp soft tissue finding.
IMPRESSION: Advanced chronic ischemic disease with no acute intracranial
abnormality by CT.
